# Patient Record
Sex: Female | Born: 1950 | Race: White | Hispanic: No | Marital: Married | Smoking: Former smoker
Health system: Southern US, Community
[De-identification: ages and names within clinical notes are randomized; demographics above are authoritative.]

## PROBLEM LIST (undated history)

## (undated) DIAGNOSIS — I2510 Atherosclerotic heart disease of native coronary artery without angina pectoris: Secondary | ICD-10-CM

## (undated) DIAGNOSIS — Z9229 Personal history of other drug therapy: Secondary | ICD-10-CM

## (undated) DIAGNOSIS — N1832 Chronic kidney disease, stage 3b: Secondary | ICD-10-CM

## (undated) DIAGNOSIS — I480 Paroxysmal atrial fibrillation: Secondary | ICD-10-CM

## (undated) DIAGNOSIS — N179 Acute kidney failure, unspecified: Secondary | ICD-10-CM

## (undated) DIAGNOSIS — I495 Sick sinus syndrome: Secondary | ICD-10-CM

## (undated) DIAGNOSIS — I6523 Occlusion and stenosis of bilateral carotid arteries: Secondary | ICD-10-CM

## (undated) DIAGNOSIS — Z950 Presence of cardiac pacemaker: Secondary | ICD-10-CM

## (undated) DIAGNOSIS — N189 Chronic kidney disease, unspecified: Secondary | ICD-10-CM

## (undated) DIAGNOSIS — E785 Hyperlipidemia, unspecified: Secondary | ICD-10-CM

## (undated) DIAGNOSIS — I701 Atherosclerosis of renal artery: Secondary | ICD-10-CM

## (undated) DIAGNOSIS — I10 Essential (primary) hypertension: Secondary | ICD-10-CM

## (undated) DIAGNOSIS — Z951 Presence of aortocoronary bypass graft: Secondary | ICD-10-CM

## (undated) HISTORY — PX: CORONARY ANGIOPLASTY WITH STENT PLACEMENT: SHX49

## (undated) HISTORY — PX: BOWEL RESECTION: SHX1257

## (undated) HISTORY — DX: Chronic kidney disease, stage 3b: N18.32

## (undated) HISTORY — DX: Acute kidney failure, unspecified: N17.9

## (undated) HISTORY — DX: Chronic kidney disease, unspecified: N18.9

## (undated) HISTORY — PX: CARDIOVERSION: SHX1299

## (undated) HISTORY — PX: CRYOABLATION: SHX1415

## (undated) HISTORY — PX: INSERT / REPLACE / REMOVE PACEMAKER: SUR710

## (undated) NOTE — *Deleted (*Deleted)
Primary Care Physician: Forrest Moron, MD Primary Cardiologist: Dr Dulce Sellar Primary Electrophysiologist: Dr Elberta Fortis Referring Physician: Dr Lenice Pressman is a 57 y.o. female with a history of SSS s/p PPM, CAD s/p CABG, HTN, renal artery stenosis, HLD, atrial flutter and atrial fibrillation who presents for follow up in the [facility]. Patient has a a previous cryoablation in 2019 and has been maintained on sotalol. She had more episodes of afib with symptoms of weakness, fatigue, and SOB and underwent repeat ablation with Dr Elberta Fortis on 03/22/19. She is on Eliquis for a CHADS2VASC score of 4. The device clinic received an alert for an ongoing afib episode since 10/02/19. Patient was unaware of her arrhythmia.   On follow up today, patient is s/p repeat ablation with Dr Elberta Fortis on 04/04/20. ***  Today, she denies symptoms of ***palpitations, chest pain, shortness of breath, orthopnea, PND, lower extremity edema, dizziness, presyncope, syncope, snoring, daytime somnolence, bleeding, or neurologic sequela. The patient is tolerating medications without difficulties and is otherwise without complaint today.    Atrial Fibrillation Risk Factors:  she does not have symptoms or diagnosis of sleep apnea. she does not have a history of rheumatic fever.  she has a BMI of There is no height or weight on file to calculate BMI.. There were no vitals filed for this visit.  Family History  Problem Relation Age of Onset  . Hypertension Mother   . Heart attack Father 78  . Hypertension Sister   . Hypertension Sister   . Hypertension Daughter   . Hypertension Son      Atrial Fibrillation Management history:  Previous antiarrhythmic drugs: sotalol  Previous cardioversions: remotely Previous ablations: cryoablation 2019, 03/22/19 (fib and flutter), 04/04/20 CHADS2VASC score: 4 Anticoagulation history: Eliquis   Past Medical History:  Diagnosis Date  . CAD  in native artery 01/02/2019  . Carotid stenosis, bilateral 01/02/2019  . Essential hypertension 01/02/2019  . History of amiodarone therapy 01/02/2019  . Hx of CABG 01/02/2019  . Hyperlipidemia 01/02/2019  . PAF (paroxysmal atrial fibrillation) (HCC) 01/02/2019  . Renal artery stenosis (HCC) 01/02/2019  . Sick sinus syndrome (HCC) 01/02/2019   Past Surgical History:  Procedure Laterality Date  . ATRIAL FIBRILLATION ABLATION N/A 03/22/2019   Procedure: ATRIAL FIBRILLATION ABLATION;  Surgeon: Regan Lemming, MD;  Location: [facility] INV[facility] CV LAB;  Service: Cardiovascular;  Laterality: N/A;  . ATRIAL FIBRILLATION ABLATION N/A 04/04/2020   Procedure: ATRIAL FIBRILLATION ABLATION;  Surgeon: Regan Lemming, MD;  Location: [facility] INVASIVE CV LAB;  Service: Cardiovascular;  Laterality: N/A;  . BOWEL RESECTION    . CARDIOVERSION    . CORONARY ANGIOPLASTY WITH STENT PLACEMENT    . CORONARY ARTERY BYPASS GRAFT  2016   x 3 vessels  . CRYOABLATION    . INSERT / REPLACE / REMOVE PACEMAKER      Current Outpatient Medications  Medication Sig Dispense Refill  . acetaminophen (TYLENOL) 500 MG tablet Take 1,000 mg by mouth every 6 (six) hours as needed for moderate pain or headache.    . albuterol (VENTOLIN HFA) 108 (90 Base) MCG/ACT inhaler Inhale 2 puffs into the lungs every 6 (six) hours as needed for wheezing or shortness of breath.     . allopurinol (ZYLOPRIM) 100 MG tablet Take 100 mg by mouth daily.    Marland Kitchen ALPRAZolam (XANAX) 0.25 MG tablet Take 0.125-0.25 mg by mouth at bedtime.     Marland Kitchen aspirin EC 81 MG tablet Take 81  mg by mouth daily.     Marland Kitchen atorvastatin (LIPITOR) 40 MG tablet Take 40 mg by mouth daily.     . Biotin 5000 MCG CAPS Take 5,000 mcg by mouth daily.    . [place]-Glucosamine-Vit D (OSTEO BI-FLEX ONE PER DAY PO) Take 1 tablet by mouth daily.    . carvedilol (COREG) 12.5 MG tablet TAKE 1 TABLET BY MOUTH 2 TIMES DAILY. 180 tablet 1  . cloNIDine (CATAPRES) 0.1 MG tablet Take 0.1 mg by mouth 2  (two) times daily.    Marland Kitchen ELIQUIS 5 MG TABS tablet TAKE 1 TABLET BY MOUTH TWICE A DAY (Patient taking differently: Take 5 mg by mouth 2 (two) times daily. ) 180 tablet 2  . furosemide (LASIX) 40 MG tablet Take 20-40 mg by mouth See admin instructions. Take 40 mg in the morning and 20 mg in the evening    . levothyroxine (SYNTHROID) 75 MCG tablet Take 75 mcg by mouth daily before breakfast.     . losartan (COZAAR) 100 MG tablet Take 100 mg by mouth daily.    . nitroGLYCERIN (NITROSTAT) 0.4 MG SL tablet Place 0.4 mg under the tongue every 5 (five) minutes x 3 doses as needed for chest pain.     . potassium chloride (KLOR-CON) 10 MEQ tablet Take 10 mEq by mouth every other day.    . pramipexole (MIRAPEX) 0.25 MG tablet Take 0.25 mg by mouth every evening.     . saline (AYR) GEL Place 1 application into the nose daily as needed (dryness).    . sotalol (BETAPACE) 80 MG tablet Take 1 tablet (80 mg total) by mouth 2 (two) times daily. (Patient not taking: Reported on 03/28/2020) 180 tablet 3  . SOTALOL AF 80 MG TABS Take 80 mg by mouth 2 (two) times daily.     . vitamin B-12 (CYANOCOBALAMIN) 1000 MCG tablet Take 1,000 mcg by mouth daily.     No current facility-administered medications for this visit.    Allergies  Allergen Reactions  . Tizanidine Itching and Other (See Comments)    "didnt like the way it made her feel" -- weakness   . Ace Inhibitors Other (See Comments)    Cannot recall   . Hydrocodone-Homatropine Nausea And Vomiting  . Rofecoxib Nausea Only    MADE ME FEEL WEIRD     Social History   Socioeconomic History  . Marital status: Married    Spouse name: Not on file  . Number of children: Not on file  . Years of education: Not on file  . Highest education level: Not on file  Occupational History  . Not on file  Tobacco Use  . Smoking status: Former Smoker    Packs/day: 0.75    Years: 45.00    Pack years: 33.75    Types: Cigarettes    Quit date: 2009    Years since  quitting: 12.8  . Smokeless tobacco: Never Used  Vaping Use  . Vaping Use: Never used  Substance and Sexual Activity  . Alcohol use: Yes    Alcohol/week: 2.0 standard drinks    Types: 2 Cans of beer per week    Comment: 2 beers daily  . Drug use: Never  . Sexual activity: Not on file  Other Topics Concern  . Not on file  Social History Narrative  . Not on file   Social Determinants of Health   Financial Resource Strain:   . Difficulty of Paying Living Expenses: Not on file  Food  Insecurity:   . Worried About Programme researcher, broadcasting/film/video in the Last Year: Not on file  . Ran Out of Food in the Last Year: Not on file  Transportation Needs:   . Lack of Transportation (Medical): Not on file  . Lack of Transportation (Non-Medical): Not on file  Physical Activity:   . Days of Exercise per Week: Not on file  . Minutes of Exercise per Session: Not on file  Stress:   . Feeling of Stress : Not on file  Social Connections:   . Frequency of Communication with Friends and Family: Not on file  . Frequency of Social Gatherings with Friends and Family: Not on file  . Attends Religious Services: Not on file  . Active Member of Clubs or Organizations: Not on file  . Attends Banker Meetings: Not on file  . Marital Status: Not on file  Intimate Partner Violence:   . Fear of Current or Ex-Partner: Not on file  . Emotionally Abused: Not on file  . Physically Abused: Not on file  . Sexually Abused: Not on file     ROS- All systems are reviewed and negative except as per the HPI above.  Physical Exam: There were no vitals filed for this visit.  GEN- The patient is well appearing, alert and oriented x 3 today.   HEENT-head normocephalic, atraumatic, sclera clear, conjunctiva pink, hearing intact, trachea midline. Lungs- Clear to ausculation bilaterally, normal work of breathing Heart- ***Regular rate and rhythm, no murmurs, rubs or gallops  GI- soft, NT, ND, + BS Extremities- no  clubbing, cyanosis, or edema MS- no significant deformity or atrophy Skin- no rash or lesion Psych- euthymic mood, full affect Neuro- strength and sensation are intact   Wt Readings from Last 3 Encounters:  04/04/20 86.6 kg  03/30/20 88 kg  02/28/20 86.6 kg    EKG today demonstrates ***  Echo 01/10/19 demonstrated   1. Mild hypokinesis of the left ventricular, basal-mid inferoseptal wall.  2. The left ventricle has low normal systolic function, with an ejection fraction of 50-55%. The cavity size was normal. Left ventricular diastolic Doppler parameters are consistent with pseudonormalization.  3. The right ventricle has normal systolic function. The cavity was normal. There is no increase in right ventricular wall thickness.  4. Left atrial size was mildly dilated.  5. Mild thickening of the aortic valve. Mild calcification of the aortic valve. Aortic valve regurgitation was not assessed by color flow Doppler.  6. The aorta is normal in size and structure.   Epic records are reviewed at length today  Assessment and Plan:  1. Paroxysmal atrial fibrillation/atrial flutter S/p cryoablation 2019. S/p RF ablation 03/22/19 and 04/04/20 with Dr Elberta Fortis. *** Continue Eliquis 5 mg BID  Continue sotalol 80 mg BID. QT stable. Continue Coreg 12.5 mg BID  This patients CHA2DS2-VASc Score and unadjusted Ischemic Stroke Rate (% per year) is equal to 4.8 % stroke rate/year from a score of 4  Above score calculated as 1 point each if present [CHF, HTN, DM, Vascular=MI/PAD/Aortic Plaque, Age if 65-74, or Female] Above score calculated as 2 points each if present [Age > 75, or Stroke/TIA/TE]  2. Obesity There is no height or weight on file to calculate BMI. Lifestyle modification was discussed and encouraged including regular physical activity and weight reduction. ***  3. CAD S/p CABG. No anginal symptoms. ***  4. HTN Stable, no changes today. ***  5. Sick sinus syndrome S/p PPM,  followed by Dr  Camnitz and the device clinic. ***   Follow up ***     Jorja Loa PA-C [facility] [address] [place], [place] [address] [phone] 04/25/2020 10:56 AM

---
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
## 2014-04-20 DIAGNOSIS — M79645 Pain in left finger(s): Secondary | ICD-10-CM

## 2014-04-20 HISTORY — DX: Pain in left finger(s): M79.645

## 2014-06-16 HISTORY — PX: CORONARY ARTERY BYPASS GRAFT: SHX141

## 2015-02-18 DIAGNOSIS — F1010 Alcohol abuse, uncomplicated: Secondary | ICD-10-CM

## 2015-02-18 DIAGNOSIS — E039 Hypothyroidism, unspecified: Secondary | ICD-10-CM | POA: Insufficient documentation

## 2015-02-18 HISTORY — DX: Alcohol abuse, uncomplicated: F10.10

## 2015-03-21 DIAGNOSIS — Z951 Presence of aortocoronary bypass graft: Secondary | ICD-10-CM | POA: Insufficient documentation

## 2015-03-30 DIAGNOSIS — I480 Paroxysmal atrial fibrillation: Secondary | ICD-10-CM | POA: Diagnosis present

## 2015-08-16 DIAGNOSIS — K219 Gastro-esophageal reflux disease without esophagitis: Secondary | ICD-10-CM | POA: Insufficient documentation

## 2015-08-16 DIAGNOSIS — I252 Old myocardial infarction: Secondary | ICD-10-CM | POA: Insufficient documentation

## 2015-08-16 HISTORY — DX: Old myocardial infarction: I25.2

## 2015-08-17 DIAGNOSIS — Z86010 Personal history of colonic polyps: Secondary | ICD-10-CM

## 2015-08-17 HISTORY — DX: Personal history of colonic polyps: Z86.010

## 2016-09-17 DIAGNOSIS — H43391 Other vitreous opacities, right eye: Secondary | ICD-10-CM | POA: Insufficient documentation

## 2016-09-17 DIAGNOSIS — H43811 Vitreous degeneration, right eye: Secondary | ICD-10-CM

## 2016-09-17 HISTORY — DX: Vitreous degeneration, right eye: H43.811

## 2016-09-17 HISTORY — DX: Other vitreous opacities, right eye: H43.391

## 2016-09-23 DIAGNOSIS — I824Z2 Acute embolism and thrombosis of unspecified deep veins of left distal lower extremity: Secondary | ICD-10-CM | POA: Insufficient documentation

## 2016-09-23 DIAGNOSIS — N289 Disorder of kidney and ureter, unspecified: Secondary | ICD-10-CM | POA: Insufficient documentation

## 2016-09-23 DIAGNOSIS — M79605 Pain in left leg: Secondary | ICD-10-CM

## 2016-09-23 HISTORY — DX: Pain in left leg: M79.605

## 2016-11-22 DIAGNOSIS — K559 Vascular disorder of intestine, unspecified: Secondary | ICD-10-CM | POA: Insufficient documentation

## 2016-11-22 DIAGNOSIS — N179 Acute kidney failure, unspecified: Secondary | ICD-10-CM | POA: Insufficient documentation

## 2016-11-22 DIAGNOSIS — I82402 Acute embolism and thrombosis of unspecified deep veins of left lower extremity: Secondary | ICD-10-CM | POA: Insufficient documentation

## 2016-11-22 DIAGNOSIS — R739 Hyperglycemia, unspecified: Secondary | ICD-10-CM

## 2016-11-22 HISTORY — DX: Hypercalcemia: E83.52

## 2016-11-22 HISTORY — DX: Hyperglycemia, unspecified: R73.9

## 2017-08-20 DIAGNOSIS — Z8679 Personal history of other diseases of the circulatory system: Secondary | ICD-10-CM | POA: Insufficient documentation

## 2017-09-22 DIAGNOSIS — R001 Bradycardia, unspecified: Secondary | ICD-10-CM | POA: Insufficient documentation

## 2018-03-04 DIAGNOSIS — Z45018 Encounter for adjustment and management of other part of cardiac pacemaker: Secondary | ICD-10-CM

## 2018-03-04 HISTORY — DX: Encounter for adjustment and management of other part of cardiac pacemaker: Z45.018

## 2018-07-14 DIAGNOSIS — I4719 Other supraventricular tachycardia: Secondary | ICD-10-CM | POA: Insufficient documentation

## 2018-07-14 DIAGNOSIS — I471 Supraventricular tachycardia: Secondary | ICD-10-CM | POA: Insufficient documentation

## 2018-08-05 DIAGNOSIS — I6529 Occlusion and stenosis of unspecified carotid artery: Secondary | ICD-10-CM | POA: Insufficient documentation

## 2019-01-02 DIAGNOSIS — I2510 Atherosclerotic heart disease of native coronary artery without angina pectoris: Secondary | ICD-10-CM

## 2019-01-02 DIAGNOSIS — Z9229 Personal history of other drug therapy: Secondary | ICD-10-CM | POA: Insufficient documentation

## 2019-01-02 DIAGNOSIS — I10 Essential (primary) hypertension: Secondary | ICD-10-CM

## 2019-01-02 DIAGNOSIS — I6523 Occlusion and stenosis of bilateral carotid arteries: Secondary | ICD-10-CM | POA: Insufficient documentation

## 2019-01-02 DIAGNOSIS — I495 Sick sinus syndrome: Secondary | ICD-10-CM

## 2019-01-02 DIAGNOSIS — Z951 Presence of aortocoronary bypass graft: Secondary | ICD-10-CM

## 2019-01-02 DIAGNOSIS — I480 Paroxysmal atrial fibrillation: Secondary | ICD-10-CM

## 2019-01-02 DIAGNOSIS — E785 Hyperlipidemia, unspecified: Secondary | ICD-10-CM

## 2019-01-02 DIAGNOSIS — I701 Atherosclerosis of renal artery: Secondary | ICD-10-CM

## 2019-01-02 HISTORY — DX: Atherosclerosis of renal artery: I70.1

## 2019-01-02 HISTORY — DX: Atherosclerotic heart disease of native coronary artery without angina pectoris: I25.10

## 2019-01-02 HISTORY — DX: Presence of aortocoronary bypass graft: Z95.1

## 2019-01-02 HISTORY — DX: Hyperlipidemia, unspecified: E78.5

## 2019-01-02 HISTORY — DX: Occlusion and stenosis of bilateral carotid arteries: I65.23

## 2019-01-02 HISTORY — DX: Essential (primary) hypertension: I10

## 2019-01-02 HISTORY — DX: Personal history of other drug therapy: Z92.29

## 2019-01-02 HISTORY — DX: Paroxysmal atrial fibrillation: I48.0

## 2019-01-02 HISTORY — DX: Sick sinus syndrome: I49.5

## 2019-01-02 NOTE — Progress Notes (Signed)
Cardiology Office Note:    Date:  01/03/2019   ID:  Diane Valentine, DOB 06-27-1950, MRN [id number]  PCP:  Forrest Moronuehle, Stephen, MD  Cardiologist:  Norman HerrlichBrian , MD   Referring MD: Forrest Moronuehle, Stephen, MD  ASSESSMENT:    1. PAF (paroxysmal atrial fibrillation) (HCC)   2. Chronic anticoagulation   3. Sick sinus syndrome (HCC)   4. Pacemaker   5. CAD in native artery   6. Hx of CABG   7. Essential hypertension   8. Renal artery stenosis (HCC)   9. Mixed hyperlipidemia   10. Carotid stenosis, bilateral   11. History of amiodarone therapy    PLAN:    In order of problems listed above:  1. Continued symptoms after EP cryoablation antiarrhythmic drug therapy refer to device clinic interrogation and EP consultation for the moment continue sotalol 2. Continue her current anticoagulant moderate risk of stroke 3. Refer to device clinic for management sick sinus syndrome pacemaker 4. Stable CAD after CABG continue current treatment but likely needs intensification of lipid-lowering therapy 5. Hypertension not at target transition from atenolol to carvedilol and obtain results of recently performed renal artery duplex 6. Recheck lipid profile liver function and may require addition of PCSK9 7. Obtain recent carotid duplex she has had bilateral carotid endarterectomy and if she has significant abnormality will need referral to vascular surgery  Next appointment 4 weeks   Medication Adjustments/Labs and Tests Ordered: Current medicines are reviewed at length with the patient today.  Concerns regarding medicines are outlined above.  Orders Placed This Encounter  Procedures  . TSH  . CBC  . Comprehensive Metabolic Panel (CMET)  . Lipid Profile  . Pro b natriuretic peptide  . Ambulatory referral to Cardiac Electrophysiology  . EKG 12-Lead  . ECHOCARDIOGRAM COMPLETE   Meds ordered this encounter  Medications  . carvedilol (COREG) 12.5 MG tablet    Sig: Take 1 tablet (12.5 mg total) by mouth  2 (two) times daily.    Dispense:  60 tablet    Refill:  3     Chief Complaint  Patient presents with  . Atrial Fibrillation    History of Present Illness:    Diane Valentine is a 68 y.o. female who is being seen today for the evaluation of atrial fibrillation at the request of Forrest Moronuehle, Stephen, MD.  [facility] electrophysiology for sick sinus syndrome with a permanent pacemaker and paroxysmal atrial fibrillation with both previous cardioversion and EP cryoablation in 2019.  Other problems include hypertension renal artery stenosis with previous right renal artery stent dyslipidemia and coronary artery disease.  She has had a previous bilateral carotid endarterectomy approximately 10 years ago had been followed by vascular surgery at the same institution.  She also has a history of deep vein thrombosis in September 2017 left lower extremity.  She had both bilateral renal artery duplex and carotid duplexes performed 11/03/2018 results are not available in care everywhere.  There is documentation of the time of EP consultation 07/24/2017 that amiodarone was ineffective in maintaining sinus rhythm  She is very frustrated especially with atrial fibrillation.  She has had cardioversion antiarrhythmic drug with amiodarone that failed cryoablation most recently sotalol.  She continues to have episodes at night where she thinks she is in atrial fibrillation palpitation irregular heart rhythm quite bothersome and despite calls has not received an answer from the pacemaker clinic.  That frustration letter to be here today.  She is anticoagulated and is interested in either optimizing antiarrhythmic drug therapy or repeat EP ablation.  She has a history of CAD and bypass surgery she has no angina I cannot find a recent ejection fraction echocardiogram ordered.  Have ordered complete labs including thyroid along with liver CBC proBNP and a CMP.  She told  me at one point she gained 15 pounds had marked edema short of breath placed on furosemide but does not carry attack of congestive heart failure.  After discussing her wishes with her I will refer to EP in our practice Dr. Elberta Fortisamnitz in device clinic and see if we get an answer about her atrial fibrillation burden and options for treatment either is transitioning to dofetilide or considering further EP catheter ablation if needed.  She is comfortable with this approach.  Her blood pressure is consistently greater than 150 systolic and will transition from atenolol minimal effect of potent antihypertensive carvedilol which should help with arrhythmia and achieve blood pressure control.  She likely needs intensification lipid-lowering therapy with poly-vascular disease and if her LDL remains greater than 70 PCSK9 be appropriate addition to Zetia and statin Past Medical History:  Diagnosis Date  . CAD in native artery 01/02/2019  . Carotid stenosis, bilateral 01/02/2019  . Essential hypertension 01/02/2019  . History of amiodarone therapy 01/02/2019  . Hx of CABG 01/02/2019  . Hyperlipidemia 01/02/2019  . PAF (paroxysmal atrial fibrillation) (HCC) 01/02/2019  . Renal artery stenosis (HCC) 01/02/2019  . Sick sinus syndrome (HCC) 01/02/2019    Past Surgical History:  Procedure Laterality Date  . BOWEL RESECTION    . CARDIOVERSION    . CORONARY ANGIOPLASTY WITH STENT PLACEMENT    . CORONARY ARTERY BYPASS GRAFT  2016   x 3 vessels  . CRYOABLATION    . INSERT / REPLACE / REMOVE PACEMAKER      Current Medications: Current Meds  Medication Sig  . albuterol (VENTOLIN HFA) 108 (90 Base) MCG/ACT inhaler Inhale 2 puffs into the lungs every 6 (six) hours as needed.  Marland Kitchen. allopurinol (ZYLOPRIM) 100 MG tablet Take 100 mg by mouth daily.  Marland Kitchen. apixaban (ELIQUIS) 5 MG TABS tablet TAKE 1 TABLET TWICE DAILY  . aspirin EC 81 MG tablet Take 1 tablet by mouth daily.  Marland Kitchen. atorvastatin (LIPITOR) 40 MG tablet TAKE 1 TABLET EVERY  DAY  . cloNIDine (CATAPRES) 0.1 MG tablet Take 0.1 mg by mouth 2 (two) times daily.  . furosemide (LASIX) 40 MG tablet Take 1 tablet in the morning and 0.5 tablet in the evening  . levothyroxine (SYNTHROID) 75 MCG tablet Take 75 mcg by mouth daily.  Marland Kitchen. losartan (COZAAR) 100 MG tablet Take 100 mg by mouth daily.  . nitroGLYCERIN (NITROSTAT) 0.4 MG SL tablet Place 0.4 mg under the tongue every 5 (five) minutes x 3 doses as needed.  . potassium chloride (MICRO-K) 10 MEQ CR capsule Take 10 mEq by mouth daily.  . pramipexole (MIRAPEX) 0.25 MG tablet TAKE 1 TABLET BY MOUTH 2 TO 3 HOURS PRIOR TO BEDTIME  . sotalol (BETAPACE) 120 MG tablet Take 1 tablet by mouth 2 (two) times a day.  . vitamin B-12 (CYANOCOBALAMIN) 1000 MCG tablet Take 1,000 mcg by mouth daily.  . [DISCONTINUED] atenolol (TENORMIN) 100 MG tablet Take 100 mg by mouth daily.     Allergies:   Tizanidine, Ace inhibitors, Hydrocodone-homatropine, and Rofecoxib   Social History   Socioeconomic History  . Marital status: Married    Spouse name: Not on file  .  Number of children: Not on file  . Years of education: Not on file  . Highest education level: Not on file  Occupational History  . Not on file  Social Needs  . Financial resource strain: Not on file  . Food insecurity    Worry: Not on file    Inability: Not on file  . Transportation needs    Medical: Not on file    Non-medical: Not on file  Tobacco Use  . Smoking status: Former Smoker    Packs/day: 0.75    Years: 45.00    Pack years: 33.75    Types: Cigarettes    Quit date: 2009    Years since quitting: 11.5  . Smokeless tobacco: Never Used  Substance and Sexual Activity  . Alcohol use: Yes    Alcohol/week: 1.0 - 2.0 standard drinks    Types: 1 - 2 Cans of beer per week    Comment: 1-2 beers daily  . Drug use: Never  . Sexual activity: Not on file  Lifestyle  . Physical activity    Days per week: Not on file    Minutes per session: Not on file  . Stress:  Not on file  Relationships  . Social Herbalist on phone: Not on file    Gets together: Not on file    Attends religious service: Not on file    Active member of club or organization: Not on file    Attends meetings of clubs or organizations: Not on file    Relationship status: Not on file  Other Topics Concern  . Not on file  Social History Narrative  . Not on file     Family History: The patient's family history includes Heart attack (age of onset: 28) in her father; Hypertension in her daughter, mother, sister, sister, and son.  ROS:   Review of Systems  Constitution: Positive for malaise/fatigue.  HENT: Negative.   Eyes: Negative.   Cardiovascular: Positive for dyspnea on exertion, leg swelling and palpitations.  Respiratory: Positive for shortness of breath.   Endocrine: Negative.   Hematologic/Lymphatic: Negative.   Skin: Negative.   Musculoskeletal: Negative.   Gastrointestinal: Negative.   Genitourinary: Negative.   Neurological: Negative.   Psychiatric/Behavioral: Negative.   Allergic/Immunologic: Negative.    Please see the history of present illness.     All other systems reviewed and are negative.  EKGs/Labs/Other Studies Reviewed:    The following studies were reviewed today:   EKG:  EKG is  ordered today.  The ekg ordered today is personally reviewed and demonstrates sinus rhythm right bundle branch block deep symmetrical precordial T wave inversion normal QT interval occasional ventricular paced complexes Echo 08/18/2017:    Recent Labs: 12/02/2018: CBC normal BMP normal potassium 3.9 GFR greater than 60 cc 07/23/2016 cholesterol 188 HDL 74 LDL calculated at 94  Physical Exam:    VS:  BP (!) 152/98 (BP Location: Right Arm, Patient Position: Sitting, Cuff Size: Large)   Pulse 83   Ht 5\' 5"  (1.651 m)   Wt 189 lb (85.7 kg)   SpO2 94%   BMI 31.45 kg/m     Wt Readings from Last 3 Encounters:  01/03/19 189 lb (85.7 kg)     GEN:  Well  nourished, well developed in no acute distress HEENT: Normal NECK: No JVD; No carotid bruits LYMPHATICS: No lymphadenopathy CARDIAC: RRR, no murmurs, rubs, gallops RESPIRATORY:  Clear to auscultation without rales, wheezing or rhonchi  ABDOMEN:  Soft, non-tender, non-distended MUSCULOSKELETAL:  No edema; No deformity  SKIN: Warm and dry NEUROLOGIC:  Alert and oriented x 3 PSYCHIATRIC:  Normal affect     Signed, Norman HerrlichBrian , MD  01/03/2019 5:04 PM    [facility]

## 2019-01-03 ENCOUNTER — Ambulatory Visit (INDEPENDENT_AMBULATORY_CARE_PROVIDER_SITE_OTHER): Payer: Medicare Other | Admitting: Cardiology

## 2019-01-03 ENCOUNTER — Other Ambulatory Visit: Payer: Self-pay

## 2019-01-03 VITALS — BP 152/98 | HR 83 | Ht 65.0 in | Wt 189.0 lb

## 2019-01-03 DIAGNOSIS — I701 Atherosclerosis of renal artery: Secondary | ICD-10-CM

## 2019-01-03 DIAGNOSIS — Z7901 Long term (current) use of anticoagulants: Secondary | ICD-10-CM | POA: Diagnosis not present

## 2019-01-03 DIAGNOSIS — Z950 Presence of cardiac pacemaker: Secondary | ICD-10-CM

## 2019-01-03 DIAGNOSIS — I2510 Atherosclerotic heart disease of native coronary artery without angina pectoris: Secondary | ICD-10-CM

## 2019-01-03 DIAGNOSIS — I480 Paroxysmal atrial fibrillation: Secondary | ICD-10-CM

## 2019-01-03 DIAGNOSIS — I10 Essential (primary) hypertension: Secondary | ICD-10-CM

## 2019-01-03 DIAGNOSIS — I495 Sick sinus syndrome: Secondary | ICD-10-CM | POA: Diagnosis not present

## 2019-01-03 DIAGNOSIS — E782 Mixed hyperlipidemia: Secondary | ICD-10-CM

## 2019-01-03 DIAGNOSIS — Z9229 Personal history of other drug therapy: Secondary | ICD-10-CM

## 2019-01-03 DIAGNOSIS — I6523 Occlusion and stenosis of bilateral carotid arteries: Secondary | ICD-10-CM

## 2019-01-03 DIAGNOSIS — Z951 Presence of aortocoronary bypass graft: Secondary | ICD-10-CM

## 2019-01-03 MED ORDER — CARVEDILOL 12.5 MG PO TABS: 12.5000 mg | ORAL_TABLET | Freq: Two times a day (BID) | ORAL | 3 refills | Status: DC

## 2019-01-03 NOTE — Patient Instructions (Signed)
Medication Instructions:  STOP: Atenolol START: Carvedilol 12.5 mg twice daily  If you need a refill on your cardiac medications before your next appointment, please call your pharmacy.   Lab work: TSH CBC CMP Lipids Pro BNP  If you have labs (blood work) drawn today and your tests are completely normal, you will receive your results only by: Marland Kitchen. MyChart Message (if you have MyChart) OR . A paper copy in the mail If you have any lab test that is abnormal or we need to change your treatment, we will call you to review the results.  Testing/Procedures: Your physician has requested that you have an echocardiogram. Echocardiography is a painless test that uses sound waves to create images of your heart. It provides your doctor with information about the size and shape of your heart and how well your heart's chambers and valves are working. This procedure takes approximately one hour. There are no restrictions for this procedure.  Follow-Up: At [facility], you and your health needs are our priority.  As part of our continuing mission to provide you with exceptional heart care, we have created designated Provider Care Teams.  These Care Teams include your primary Cardiologist (physician) and Advanced Practice Providers (APPs -  Physician Assistants and Nurse Practitioners) who all work together to provide you with the care you need, when you need it. .   Any Other Special Instructions Will Be Listed Below (If Applicable). You're being referred to Dr. Elberta Fortisamnitz for your pacemaker. They will contact you to schedule your appt.   Echocardiogram An echocardiogram is a procedure that uses painless sound waves (ultrasound) to produce an image of the heart. Images from an echocardiogram can provide important information about:  Signs of coronary artery disease (CAD).  Aneurysm detection. An aneurysm is a weak or damaged part of an artery wall that bulges out from the normal force of blood pumping  through the body.  Heart size and shape. Changes in the size or shape of the heart can be associated with certain conditions, including heart failure, aneurysm, and CAD.  Heart muscle function.  Heart valve function.  Signs of a past heart attack.  Fluid buildup around the heart.  Thickening of the heart muscle.  A tumor or infectious growth around the heart valves. Tell a health care provider about:  Any allergies you have.  All medicines you are taking, including vitamins, herbs, eye drops, creams, and over-the-counter medicines.  Any blood disorders you have.  Any surgeries you have had.  Any medical conditions you have.  Whether you are pregnant or may be pregnant. What are the risks? Generally, this is a safe procedure. However, problems may occur, including:  Allergic reaction to dye (contrast) that may be used during the procedure. What happens before the procedure? No specific preparation is needed. You may eat and drink normally. What happens during the procedure?   An IV tube may be inserted into one of your veins.  You may receive contrast through this tube. A contrast is an injection that improves the quality of the pictures from your heart.  A gel will be applied to your chest.  A wand-like tool (transducer) will be moved over your chest. The gel will help to transmit the sound waves from the transducer.  The sound waves will harmlessly bounce off of your heart to allow the heart images to be captured in real-time motion. The images will be recorded on a computer. The procedure may vary among health care  providers and hospitals. What happens after the procedure?  You may return to your normal, everyday life, including diet, activities, and medicines, unless your health care provider tells you not to do that. Summary  An echocardiogram is a procedure that uses painless sound waves (ultrasound) to produce an image of the heart.  Images from an  echocardiogram can provide important information about the size and shape of your heart, heart muscle function, heart valve function, and fluid buildup around your heart.  You do not need to do anything to prepare before this procedure. You may eat and drink normally.  After the echocardiogram is completed, you may return to your normal, everyday life, unless your health care provider tells you not to do that. This information is not intended to replace advice given to you by your health care provider. Make sure you discuss any questions you have with your health care provider. Document Released: 05/30/2000 Document Revised: 09/23/2018 Document Reviewed: 07/05/2016 Elsevier Patient Education  2020 [employer].

## 2019-01-04 LAB — COMPREHENSIVE METABOLIC PANEL
ALT: 19 IU/L (ref 0–32)
AST: 23 IU/L (ref 0–40)
Albumin/Globulin Ratio: 1.9 (ref 1.2–2.2)
Albumin: 4.8 g/dL (ref 3.8–4.8)
Alkaline Phosphatase: 123 IU/L — ABNORMAL HIGH (ref 39–117)
BUN/Creatinine Ratio: 17 (ref 12–28)
BUN: 29 mg/dL — ABNORMAL HIGH (ref 8–27)
Bilirubin Total: 0.6 mg/dL (ref 0.0–1.2)
CO2: 25 mmol/L (ref 20–29)
Calcium: 10.5 mg/dL — ABNORMAL HIGH (ref 8.7–10.3)
Chloride: 94 mmol/L — ABNORMAL LOW (ref 96–106)
Creatinine, Ser: 1.67 mg/dL — ABNORMAL HIGH (ref 0.57–1.00)
GFR calc Af Amer: 36 mL/min/{1.73_m2} — ABNORMAL LOW (ref >59)
GFR calc non Af Amer: 31 mL/min/{1.73_m2} — ABNORMAL LOW (ref >59)
Globulin, Total: 2.5 g/dL (ref 1.5–4.5)
Glucose: 106 mg/dL — ABNORMAL HIGH (ref 65–99)
Potassium: 4.6 mmol/L (ref 3.5–5.2)
Sodium: 137 mmol/L (ref 134–144)
Total Protein: 7.3 g/dL (ref 6.0–8.5)

## 2019-01-04 LAB — CBC
Hematocrit: 48 % — ABNORMAL HIGH (ref 34.0–46.6)
Hemoglobin: 15.6 g/dL (ref 11.1–15.9)
MCH: 30.5 pg (ref 26.6–33.0)
MCHC: 32.5 g/dL (ref 31.5–35.7)
MCV: 94 fL (ref 79–97)
Platelets: 182 10*3/uL (ref 150–450)
RBC: 5.12 x10E6/uL (ref 3.77–5.28)
RDW: 15.5 % — ABNORMAL HIGH (ref 11.7–15.4)
WBC: 6.1 10*3/uL (ref 3.4–10.8)

## 2019-01-04 LAB — LIPID PANEL
Chol/HDL Ratio: 2.7 ratio (ref 0.0–4.4)
Cholesterol, Total: 167 mg/dL (ref 100–199)
HDL: 61 mg/dL (ref >39)
LDL Calculated: 79 mg/dL (ref 0–99)
Triglycerides: 133 mg/dL (ref 0–149)
VLDL Cholesterol Cal: 27 mg/dL (ref 5–40)

## 2019-01-04 LAB — TSH: TSH: 1.59 u[IU]/mL (ref 0.450–4.500)

## 2019-01-04 LAB — PRO B NATRIURETIC PEPTIDE: NT-Pro BNP: 3178 pg/mL — ABNORMAL HIGH (ref 0–301)

## 2019-01-06 ENCOUNTER — Other Ambulatory Visit (HOSPITAL_BASED_OUTPATIENT_CLINIC_OR_DEPARTMENT_OTHER): Payer: Medicare Other

## 2019-01-10 ENCOUNTER — Other Ambulatory Visit: Payer: Self-pay

## 2019-01-10 ENCOUNTER — Ambulatory Visit (HOSPITAL_BASED_OUTPATIENT_CLINIC_OR_DEPARTMENT_OTHER)
Admission: RE | Admit: 2019-01-10 | Discharge: 2019-01-10 | Disposition: A | Discharge status: Home / Self Care | Payer: Medicare Other | Source: Ambulatory Visit | Attending: Cardiology | Admitting: Cardiology

## 2019-01-10 DIAGNOSIS — I2510 Atherosclerotic heart disease of native coronary artery without angina pectoris: Secondary | ICD-10-CM

## 2019-01-10 DIAGNOSIS — Z951 Presence of aortocoronary bypass graft: Secondary | ICD-10-CM | POA: Insufficient documentation

## 2019-01-10 DIAGNOSIS — I10 Essential (primary) hypertension: Secondary | ICD-10-CM | POA: Insufficient documentation

## 2019-01-10 NOTE — Progress Notes (Signed)
  Echocardiogram 2D Echocardiogram has been performed.  Diane Valentine 01/10/2019, 2:53 PM

## 2019-02-02 DIAGNOSIS — Z961 Presence of intraocular lens: Secondary | ICD-10-CM

## 2019-02-02 HISTORY — DX: Presence of intraocular lens: Z96.1

## 2019-02-08 ENCOUNTER — Encounter: Payer: Self-pay | Admitting: Cardiology

## 2019-02-08 ENCOUNTER — Ambulatory Visit (INDEPENDENT_AMBULATORY_CARE_PROVIDER_SITE_OTHER): Payer: Medicare Other | Admitting: Cardiology

## 2019-02-08 ENCOUNTER — Other Ambulatory Visit: Payer: Self-pay

## 2019-02-08 VITALS — BP 164/102 | HR 78 | Ht 65.0 in | Wt 190.6 lb

## 2019-02-08 DIAGNOSIS — I4819 Other persistent atrial fibrillation: Secondary | ICD-10-CM

## 2019-02-08 DIAGNOSIS — Z01812 Encounter for preprocedural laboratory examination: Secondary | ICD-10-CM

## 2019-02-08 DIAGNOSIS — I495 Sick sinus syndrome: Secondary | ICD-10-CM

## 2019-02-08 NOTE — Progress Notes (Signed)
Electrophysiology Office Note   Date:  02/08/2019   ID:  Diane Valentine, DOB 1951/02/28, MRN [id number]  PCP:  Charleston Poot, MD  Cardiologist:  Bettina Gavia Primary Electrophysiologist:  Selden Noteboom Meredith Leeds, MD    No chief complaint on file.    History of Present Illness: Diane Valentine is a 68 y.o. female who is being seen today for the evaluation of pacemaker at the request of Bettina Gavia, Hilton Cork, MD. Presenting today for electrophysiology evaluation.  She has a history significant for paroxysmal atrial fibrillation, sick sinus syndrome status post Jude pacemaker, coronary artery disease status post CABG, hypertension, renal artery stenosis, hyperlipidemia.  She has had a cryoablation in 2019 for atrial fibrillation and is currently on sotalol.  Her main symptoms are weakness, fatigue, and mild shortness of breath.  She also has palpitations at times.  She feels that she has been out of rhythm for quite some time.  Today, she denies symptoms of chest pain, orthopnea, PND, lower extremity edema, claudication, dizziness, presyncope, syncope, bleeding, or neurologic sequela. The patient is tolerating medications without difficulties.    Past Medical History:  Diagnosis Date  . CAD in native artery 01/02/2019  . Carotid stenosis, bilateral 01/02/2019  . Essential hypertension 01/02/2019  . History of amiodarone therapy 01/02/2019  . Hx of CABG 01/02/2019  . Hyperlipidemia 01/02/2019  . PAF (paroxysmal atrial fibrillation) ([place]) 01/02/2019  . Renal artery stenosis ([place]) 01/02/2019  . Sick sinus syndrome ([place]) 01/02/2019   Past Surgical History:  Procedure Laterality Date  . BOWEL RESECTION    . CARDIOVERSION    . CORONARY ANGIOPLASTY WITH STENT PLACEMENT    . CORONARY ARTERY BYPASS GRAFT  2016   x 3 vessels  . CRYOABLATION    . INSERT / REPLACE / REMOVE PACEMAKER       Current Outpatient Medications  Medication Sig Dispense Refill  . albuterol (VENTOLIN HFA) 108 (90 Base) MCG/ACT inhaler  Inhale 2 puffs into the lungs every 6 (six) hours as needed.    Marland Kitchen allopurinol (ZYLOPRIM) 100 MG tablet Take 100 mg by mouth daily.    Marland Kitchen ALPRAZolam (XANAX) 0.25 MG tablet Take 0.25 mg by mouth at bedtime as needed for anxiety.    Marland Kitchen apixaban (ELIQUIS) 5 MG TABS tablet TAKE 1 TABLET TWICE DAILY    . aspirin EC 81 MG tablet Take 1 tablet by mouth daily.    Marland Kitchen atorvastatin (LIPITOR) 40 MG tablet TAKE 1 TABLET EVERY DAY    . carvedilol (COREG) 12.5 MG tablet Take 1 tablet (12.5 mg total) by mouth 2 (two) times daily. 60 tablet 3  . cloNIDine (CATAPRES) 0.1 MG tablet Take 0.1 mg by mouth 2 (two) times daily.    . furosemide (LASIX) 40 MG tablet Take 1 tablet in the morning and 0.5 tablet in the evening    . levothyroxine (SYNTHROID) 75 MCG tablet Take 75 mcg by mouth daily.    Marland Kitchen losartan (COZAAR) 100 MG tablet Take 100 mg by mouth daily.    . nitroGLYCERIN (NITROSTAT) 0.4 MG SL tablet Place 0.4 mg under the tongue every 5 (five) minutes x 3 doses as needed.    . potassium chloride (MICRO-K) 10 MEQ CR capsule Take 10 mEq by mouth daily.    . pramipexole (MIRAPEX) 0.25 MG tablet TAKE 1 TABLET BY MOUTH 2 TO 3 HOURS PRIOR TO BEDTIME    . sotalol (BETAPACE) 80 MG tablet Take 1 tablet by mouth 2 (two) times a day.     Marland Kitchen  vitamin B-12 (CYANOCOBALAMIN) 1000 MCG tablet Take 1,000 mcg by mouth daily.     No current facility-administered medications for this visit.     Allergies:   Tizanidine, Ace inhibitors, Hydrocodone-homatropine, and Rofecoxib   Social History:  The patient  reports that she quit smoking about 11 years ago. Her smoking use included cigarettes. She has a 33.75 pack-year smoking history. She has never used smokeless tobacco. She reports current alcohol use of about 1.0 - 2.0 standard drinks of alcohol per week. She reports that she does not use drugs.   Family History:  The patient's family history includes Heart attack (age of onset: 63) in her father; Hypertension in her daughter, mother,  sister, sister, and son.    ROS:  Please see the history of present illness.   Otherwise, review of systems is positive for none.   All other systems are reviewed and negative.    PHYSICAL EXAM: VS:  BP (!) 164/102   Pulse 78   Ht 5\' 5"  (1.651 m)   Wt 190 lb 9.6 oz (86.5 kg)   SpO2 95%   BMI 31.72 kg/m  , BMI Body mass index is 31.72 kg/m. GEN: Well nourished, well developed, in no acute distress  HEENT: normal  Neck: no JVD, carotid bruits, or masses Cardiac: iRRR; no murmurs, rubs, or gallops,no edema  Respiratory:  clear to auscultation bilaterally, normal work of breathing GI: soft, nontender, nondistended, + BS MS: no deformity or atrophy  Skin: warm and dry, device pocket is well healed Neuro:  Strength and sensation are intact Psych: euthymic mood, full affect  EKG:  EKG is ordered today. Personal review of the ekg ordered shows fibrillation, rate 78, incomplete right bundle branch block, inferior lateral T wave inversions  Device interrogation is reviewed today in detail.  See PaceArt for details.   Recent Labs: 01/03/2019: ALT 19; BUN 29; Creatinine, Ser 1.67; Hemoglobin 15.6; NT-Pro BNP 3,178; Platelets 182; Potassium 4.6; Sodium 137; TSH 1.590    Lipid Panel     Component Value Date/Time   CHOL 167 01/03/2019 1713   TRIG 133 01/03/2019 1713   HDL 61 01/03/2019 1713   CHOLHDL 2.7 01/03/2019 1713   LDLCALC 79 01/03/2019 1713     Wt Readings from Last 3 Encounters:  02/08/19 190 lb 9.6 oz (86.5 kg)  01/03/19 189 lb (85.7 kg)      Other studies Reviewed: Additional studies/ records that were reviewed today include: TTE 01/10/19  Review of the above records today demonstrates:   1. Mild hypokinesis of the left ventricular, basal-mid inferoseptal wall.  2. The left ventricle has low normal systolic function, with an ejection fraction of 50-55%. The cavity size was normal. Left ventricular diastolic Doppler parameters are consistent with pseudonormalization.   3. The right ventricle has normal systolic function. The cavity was normal. There is no increase in right ventricular wall thickness.  4. Left atrial size was mildly dilated.  5. Mild thickening of the aortic valve. Mild calcification of the aortic valve. Aortic valve regurgitation was not assessed by color flow Doppler.  6. The aorta is normal in size and structure.   ASSESSMENT AND PLAN:  1.  Sick sinus syndrome: Status post Saint Jude dual-chamber pacemaker.  Device functioning appropriately.  No changes.  We Diane Valentine her in our device clinic.  2.  Atrial fibrillation, paroxysmal: Status post cryoablation.  Currently on and sotalol.  She is symptomatic with weakness, fatigue, and shortness of breath.  Due to that,  we Diane Valentine plan for repeat ablation.  Risks and benefits were discussed include bleeding, tamponade, heart block, stroke, damage surrounding organs.  She understands these risks and is agreed to the procedure.  This patients CHA2DS2-VASc Score and unadjusted Ischemic Stroke Rate (% per year) is equal to 4.8 % stroke rate/year from a score of 4  Above score calculated as 1 point each if present [CHF, HTN, DM, Vascular=MI/PAD/Aortic Plaque, Age if 65-74, or Female] Above score calculated as 2 points each if present [Age > 75, or Stroke/TIA/TE]  3.  Hypertension: Elevated today, but is in the 130s over 70s at home.  No changes at this time.  4.  Hyperlipidemia: Continue statin per primary cardiology   Current medicines are reviewed at length with the patient today.   The patient does not have concerns regarding her medicines.  The following changes were made today:  none  Labs/ tests ordered today include:  Orders Placed This Encounter  Procedures  . CT CARDIAC MORPH/PULM VEIN W/CM&W/O CA SCORE  . CT CORONARY FRACTIONAL FLOW RESERVE DATA PREP  . CT CORONARY FRACTIONAL FLOW RESERVE FLUID ANALYSIS  . Basic metabolic panel  . CBC  . EKG 12-Lead   Case discussed with referring  cardiologist  Disposition:   FU with Diane Valentine 3 months  Signed, Diane Teater Jorja LoaMartin Nikea Settle, MD  02/08/2019 4:44 PM     [facility] [address] [place] [address] [phone] (office) [phone] (fax)

## 2019-02-08 NOTE — Patient Instructions (Addendum)
Medication Instructions:  Your physician recommends that you continue on your current medications as directed. Please refer to the Current Medication list given to you today.  *If you need a refill on your cardiac medications before your next appointment, please call your pharmacy*  Labwork: None ordered  Testing/Procedures: Your physician has requested that you have cardiac CT within 7 days prior to your ablation. Cardiac computed tomography (CT) is a painless test that uses an x-ray machine to take clear, detailed pictures of your heart. For further information please visit [email or web address]. Please follow instruction below located under special instructions. You will get a call from our office to schedule the date for this test.  Your physician has recommended that you have an ablation. Catheter ablation is a medical procedure used to treat some cardiac arrhythmias (irregular heartbeats). During catheter ablation, a long, thin, flexible tube is put into a blood vessel in your groin (upper thigh), or neck. This tube is called an ablation catheter. It is then guided to your heart through the blood vessel. Radio frequency waves destroy small areas of heart tissue where abnormal heartbeats may cause an arrhythmia to start. Please see the instructions below located under special instructions   Follow-Up: Remote monitoring is used to monitor your Pacemaker or ICD from home. This monitoring reduces the number of office visits required to check your device to one time per year. It allows [place] to keep an eye on the functioning of your device to ensure it is working properly. You are scheduled for a device check from home on 05/10/19. You may send your transmission at any time that day. If you have a wireless device, the transmission will be sent automatically. After your physician reviews your transmission, you will receive a postcard with your next transmission date.  Your physician recommends that you  schedule a follow-up appointment in: 4 weeks, after your procedure on 03/22/19, with Roderic Palau NP in the AFib clinic.  Your physician recommends that you schedule a follow up appointment in: 3 months, after your procedure on 03/22/19, with Dr. Curt Bears.   Thank you for choosing [facility]!!   Trinidad Curet, RN [phone]  Any Other Special Instructions Will Be Listed Below (If Applicable).  CT INSTRUCTIONS Your cardiac CT will be scheduled at one of the below locations:   [facility] [address] [place], [place] [address] [phone] [address] [place], [place] [address] [phone]  Please arrive at the [facility] main entrance of [facility] 30-45 minutes prior to test start time. Proceed to the [facility] Radiology Department (first floor) to check-in and test prep.  Please follow these instructions carefully (unless otherwise directed):  On the Night Before the Test: . Be sure to Drink plenty of water. . Do not consume any caffeinated/decaffeinated beverages or chocolate 12 hours prior to your test. . Do not take any antihistamines 12 hours prior to your test.  On the Day of the Test: . Drink plenty of water. Do not drink any water within one hour of the test. . Do not eat any food 4 hours prior to the test. . You may take your regular medications prior to the test.  . HOLD Furosemide/Hydrochlorothiazide morning of the test. . FEMALES- please wear underwire-free bra if available      After the Test: . Drink plenty of water. . After receiving IV contrast, you may experience a mild flushed  feeling. This is normal. . On occasion, you may experience a mild rash up to 24 hours after the test. This is not dangerous. If this occurs, you can take Benadryl 25 mg and increase your fluid intake. . If you experience trouble breathing, this can be serious. If it is severe  call 911 IMMEDIATELY. If it is mild, please call our office.  Please contact the cardiac imaging nurse navigator should you have any questions/concerns Rockwell AlexandriaSara Wallace, RN Navigator Cardiac Imaging [facility] [phone] Office  [phone] Cell      Electrophysiology/Ablation Procedure Instructions   You are scheduled for a(n) AFib ablation on 03/22/19 with Dr. Loman BrooklynWill Camnitz.   1.   Pre procedure testing-             A.  LAB WORK --- On 03/08/19 you will go to the [facility] office for your pre procedure blood work.  You do not have to be fasting for the blood work               B. COVID TEST-- On 03/19/19 @ 12:30 pm.  [facility] to [facility] ([address], [place]) for your Covid testing.   This is a drive thru test site.  There will be multiple testing areas.  Be sure to share with the first checkpoint that you are there for pre-procedure/surgery testing. This will put you into the right (yellow) lane that leads to the PAT testing team. Stay in your car and the nurse team will come to your car to test you.  After you are tested please go home and self quarantine until the day of your procedure.     2. On the day of your procedure 03/22/19 you will go to [facility] [id number]([address]) at 8:30 a.m.  You will go to the main entrance [employer] and enter where the [employer] parking staff are.  Your driver will drop you off and you will head down the hallway to ADMITTING.  You may have one support person come in to the hospital with you.  They will be asked to wait in the waiting room.   3.   Do not eat or drink after midnight prior to your procedure.   4.   Do NOT take any medications the morning of your procedure.   5.  Plan for an overnight stay.  If you use your phone frequently bring your phone charger.   6. You will follow up with the AFIB clinic 4 weeks after your procedure.  You will follow up with Dr. Elberta Fortisamnitz  3 months after your  procedure.  These appointments will be made for you.   * If you have ANY questions please call the office [phone] and ask for Sherri RN or send me a MyChart message   * Occasionally, EP Studies and ablations can become lengthy.  Please make your family aware of this before your procedure starts.  Average time ranges from 2-8 hours for EP studies/ablations.  Your physician will call your family after the procedure with the results.

## 2019-02-14 ENCOUNTER — Encounter (HOSPITAL_COMMUNITY): Payer: Self-pay

## 2019-02-17 ENCOUNTER — Other Ambulatory Visit: Payer: Self-pay | Admitting: *Deleted

## 2019-02-17 DIAGNOSIS — Z01812 Encounter for preprocedural laboratory examination: Secondary | ICD-10-CM

## 2019-02-17 DIAGNOSIS — I4819 Other persistent atrial fibrillation: Secondary | ICD-10-CM

## 2019-03-09 ENCOUNTER — Ambulatory Visit (INDEPENDENT_AMBULATORY_CARE_PROVIDER_SITE_OTHER): Payer: Medicare Other | Admitting: *Deleted

## 2019-03-09 DIAGNOSIS — I495 Sick sinus syndrome: Secondary | ICD-10-CM

## 2019-03-09 LAB — BASIC METABOLIC PANEL
BUN/Creatinine Ratio: 16 (ref 12–28)
BUN: 27 mg/dL (ref 8–27)
CO2: 24 mmol/L (ref 20–29)
Calcium: 10.3 mg/dL (ref 8.7–10.3)
Chloride: 97 mmol/L (ref 96–106)
Creatinine, Ser: 1.67 mg/dL — ABNORMAL HIGH (ref 0.57–1.00)
GFR calc Af Amer: 36 mL/min/{1.73_m2} — ABNORMAL LOW (ref >59)
GFR calc non Af Amer: 31 mL/min/{1.73_m2} — ABNORMAL LOW (ref >59)
Glucose: 123 mg/dL — ABNORMAL HIGH (ref 65–99)
Potassium: 4.7 mmol/L (ref 3.5–5.2)
Sodium: 135 mmol/L (ref 134–144)

## 2019-03-09 LAB — CUP PACEART REMOTE DEVICE CHECK
Battery Remaining Longevity: 122 mo
Battery Remaining Percentage: 95.5 %
Battery Voltage: 3.01 V
Brady Statistic AP VP Percent: 1.1 %
Brady Statistic AP VS Percent: 1 %
Brady Statistic AS VP Percent: 33 %
Brady Statistic AS VS Percent: 17 %
Brady Statistic RA Percent Paced: 1 %
Brady Statistic RV Percent Paced: 53 %
Date Time Interrogation Session: 20200923060012
Implantable Lead Implant Date: 20190909
Implantable Lead Implant Date: 20190909
Implantable Lead Location: 753859
Implantable Lead Location: 753860
Implantable Pulse Generator Implant Date: 20190909
Lead Channel Impedance Value: 460 Ohm
Lead Channel Impedance Value: 590 Ohm
Lead Channel Pacing Threshold Amplitude: 1 V
Lead Channel Pacing Threshold Amplitude: 1.875 V
Lead Channel Pacing Threshold Pulse Width: 0.4 ms
Lead Channel Pacing Threshold Pulse Width: 0.4 ms
Lead Channel Sensing Intrinsic Amplitude: 12 mV
Lead Channel Sensing Intrinsic Amplitude: 2.2 mV
Lead Channel Setting Pacing Amplitude: 2 V
Lead Channel Setting Pacing Amplitude: 2.5 V
Lead Channel Setting Pacing Pulse Width: 0.4 ms
Lead Channel Setting Sensing Sensitivity: 2 mV
Pulse Gen Model: 2272
Pulse Gen Serial Number: 9053085

## 2019-03-09 LAB — CBC
Hematocrit: 40.7 % (ref 34.0–46.6)
Hemoglobin: 13.9 g/dL (ref 11.1–15.9)
MCH: 31.8 pg (ref 26.6–33.0)
MCHC: 34.2 g/dL (ref 31.5–35.7)
MCV: 93 fL (ref 79–97)
Platelets: 159 10*3/uL (ref 150–450)
RBC: 4.37 x10E6/uL (ref 3.77–5.28)
RDW: 14.4 % (ref 11.7–15.4)
WBC: 5.1 10*3/uL (ref 3.4–10.8)

## 2019-03-11 ENCOUNTER — Telehealth (HOSPITAL_COMMUNITY): Payer: Self-pay | Admitting: Emergency Medicine

## 2019-03-11 NOTE — Telephone Encounter (Signed)
No answering service, unable to leave message.

## 2019-03-11 NOTE — Telephone Encounter (Signed)
Reaching out to patient to offer assistance regarding upcoming cardiac imaging study; pt verbalizes understanding of appt date/time, parking situation and where to check in, pre-test NPO status and medications ordered, and verified current allergies; name and call back number provided for further questions should they arise Marchia Bond RN Navigator Cardiac Imaging [place] [facility] [phone] office [phone] cell  appt in medical day at [place] for IV hydration pre-/post- CTA w/ contrast

## 2019-03-15 ENCOUNTER — Ambulatory Visit (HOSPITAL_COMMUNITY): Payer: Medicare Other

## 2019-03-15 ENCOUNTER — Ambulatory Visit (HOSPITAL_COMMUNITY)
Admission: RE | Admit: 2019-03-15 | Discharge: 2019-03-15 | Disposition: A | Discharge status: Home / Self Care | Payer: Medicare Other | Source: Ambulatory Visit | Attending: Cardiovascular Disease | Admitting: Cardiovascular Disease

## 2019-03-15 ENCOUNTER — Encounter (HOSPITAL_COMMUNITY): Payer: Self-pay

## 2019-03-15 ENCOUNTER — Other Ambulatory Visit: Payer: Self-pay

## 2019-03-15 ENCOUNTER — Ambulatory Visit (HOSPITAL_COMMUNITY)
Admission: RE | Admit: 2019-03-15 | Discharge: 2019-03-15 | Disposition: A | Discharge status: Home / Self Care | Payer: Medicare Other | Source: Ambulatory Visit | Attending: Cardiology | Admitting: Cardiology

## 2019-03-15 ENCOUNTER — Encounter: Payer: Self-pay | Admitting: Cardiology

## 2019-03-15 DIAGNOSIS — I4819 Other persistent atrial fibrillation: Secondary | ICD-10-CM | POA: Insufficient documentation

## 2019-03-15 LAB — BASIC METABOLIC PANEL
Anion gap: 11 (ref 5–15)
BUN: 32 mg/dL — ABNORMAL HIGH (ref 8–23)
CO2: 27 mmol/L (ref 22–32)
Calcium: 10.4 mg/dL — ABNORMAL HIGH (ref 8.9–10.3)
Chloride: 94 mmol/L — ABNORMAL LOW (ref 98–111)
Creatinine, Ser: 2.06 mg/dL — ABNORMAL HIGH (ref 0.44–1.00)
GFR calc Af Amer: 28 mL/min — ABNORMAL LOW (ref >60)
GFR calc non Af Amer: 24 mL/min — ABNORMAL LOW (ref >60)
Glucose, Bld: 140 mg/dL — ABNORMAL HIGH (ref 70–99)
Potassium: 4.7 mmol/L (ref 3.5–5.1)
Sodium: 132 mmol/L — ABNORMAL LOW (ref 135–145)

## 2019-03-15 MED ORDER — SODIUM CHLORIDE 0.9 % WEIGHT BASED INFUSION: 1.0000 mL/kg/h | INTRAVENOUS | Status: DC

## 2019-03-15 MED ORDER — METOPROLOL TARTRATE 5 MG/5ML IV SOLN
INTRAVENOUS | Status: AC
  Filled 2019-03-15: qty 5

## 2019-03-15 MED ORDER — METOPROLOL TARTRATE 5 MG/5ML IV SOLN
5.0000 mg | INTRAVENOUS | Status: DC | PRN
  Administered 2019-03-15 (×4): 5 mg via INTRAVENOUS

## 2019-03-15 MED ORDER — SODIUM CHLORIDE 0.9 % WEIGHT BASED INFUSION
3.0000 mL/kg/h | INTRAVENOUS | Status: AC
  Administered 2019-03-15: 3 mL/kg/h via INTRAVENOUS

## 2019-03-15 MED ORDER — METOPROLOL TARTRATE 5 MG/5ML IV SOLN
INTRAVENOUS | Status: AC
  Administered 2019-03-15: 12:00:00 5 mg via INTRAVENOUS
  Filled 2019-03-15: qty 10

## 2019-03-15 MED ORDER — IOHEXOL 350 MG/ML SOLN
80.0000 mL | Freq: Once | INTRAVENOUS | Status: AC | PRN
  Administered 2019-03-15: 12:00:00 80 mL via INTRAVENOUS

## 2019-03-15 MED ORDER — SODIUM CHLORIDE 0.9 % WEIGHT BASED INFUSION: 3.0000 mL/kg/h | INTRAVENOUS | Status: DC

## 2019-03-15 NOTE — Progress Notes (Signed)
Patient tolerated CT without incident. Gave patient black coffee did not want anything to eat.

## 2019-03-15 NOTE — Progress Notes (Signed)
Remote pacemaker transmission.   

## 2019-03-19 ENCOUNTER — Other Ambulatory Visit (HOSPITAL_COMMUNITY)
Admission: RE | Admit: 2019-03-19 | Discharge: 2019-03-19 | Disposition: A | Discharge status: Home / Self Care | Payer: Medicare Other | Source: Ambulatory Visit | Attending: Cardiology | Admitting: Cardiology

## 2019-03-19 DIAGNOSIS — Z20828 Contact with and (suspected) exposure to other viral communicable diseases: Secondary | ICD-10-CM | POA: Insufficient documentation

## 2019-03-19 DIAGNOSIS — Z01812 Encounter for preprocedural laboratory examination: Secondary | ICD-10-CM | POA: Diagnosis present

## 2019-03-20 LAB — NOVEL CORONAVIRUS, NAA (HOSP ORDER, SEND-OUT TO REF LAB; TAT 18-24 HRS): SARS-CoV-2, NAA: NOT DETECTED

## 2019-03-21 ENCOUNTER — Encounter (HOSPITAL_COMMUNITY): Payer: Self-pay

## 2019-03-22 ENCOUNTER — Other Ambulatory Visit: Payer: Self-pay

## 2019-03-22 ENCOUNTER — Ambulatory Visit (HOSPITAL_COMMUNITY): Payer: Medicare Other | Admitting: Anesthesiology

## 2019-03-22 ENCOUNTER — Ambulatory Visit (HOSPITAL_COMMUNITY)
Admission: RE | Admit: 2019-03-22 | Discharge: 2019-03-23 | Disposition: A | Discharge status: Home / Self Care | Payer: Medicare Other | Attending: Cardiology | Admitting: Cardiology

## 2019-03-22 ENCOUNTER — Encounter (HOSPITAL_COMMUNITY): Payer: Self-pay | Admitting: Anesthesiology

## 2019-03-22 ENCOUNTER — Encounter (HOSPITAL_COMMUNITY)
Admission: RE | Disposition: A | Discharge status: Home / Self Care | Payer: Medicare Other | Source: Home / Self Care | Attending: Cardiology

## 2019-03-22 DIAGNOSIS — Z79890 Hormone replacement therapy (postmenopausal): Secondary | ICD-10-CM | POA: Insufficient documentation

## 2019-03-22 DIAGNOSIS — I4892 Unspecified atrial flutter: Secondary | ICD-10-CM | POA: Diagnosis not present

## 2019-03-22 DIAGNOSIS — I480 Paroxysmal atrial fibrillation: Secondary | ICD-10-CM | POA: Diagnosis present

## 2019-03-22 DIAGNOSIS — Z955 Presence of coronary angioplasty implant and graft: Secondary | ICD-10-CM | POA: Insufficient documentation

## 2019-03-22 DIAGNOSIS — Z951 Presence of aortocoronary bypass graft: Secondary | ICD-10-CM | POA: Insufficient documentation

## 2019-03-22 DIAGNOSIS — Z950 Presence of cardiac pacemaker: Secondary | ICD-10-CM | POA: Insufficient documentation

## 2019-03-22 DIAGNOSIS — Z87891 Personal history of nicotine dependence: Secondary | ICD-10-CM | POA: Diagnosis not present

## 2019-03-22 DIAGNOSIS — I2510 Atherosclerotic heart disease of native coronary artery without angina pectoris: Secondary | ICD-10-CM | POA: Diagnosis not present

## 2019-03-22 DIAGNOSIS — Z8249 Family history of ischemic heart disease and other diseases of the circulatory system: Secondary | ICD-10-CM | POA: Diagnosis not present

## 2019-03-22 DIAGNOSIS — I701 Atherosclerosis of renal artery: Secondary | ICD-10-CM | POA: Insufficient documentation

## 2019-03-22 DIAGNOSIS — I483 Typical atrial flutter: Secondary | ICD-10-CM | POA: Diagnosis not present

## 2019-03-22 DIAGNOSIS — Z79899 Other long term (current) drug therapy: Secondary | ICD-10-CM | POA: Insufficient documentation

## 2019-03-22 DIAGNOSIS — Z7901 Long term (current) use of anticoagulants: Secondary | ICD-10-CM | POA: Diagnosis not present

## 2019-03-22 DIAGNOSIS — I495 Sick sinus syndrome: Secondary | ICD-10-CM | POA: Insufficient documentation

## 2019-03-22 DIAGNOSIS — I10 Essential (primary) hypertension: Secondary | ICD-10-CM | POA: Insufficient documentation

## 2019-03-22 DIAGNOSIS — Z885 Allergy status to narcotic agent status: Secondary | ICD-10-CM | POA: Diagnosis not present

## 2019-03-22 DIAGNOSIS — Z7982 Long term (current) use of aspirin: Secondary | ICD-10-CM | POA: Diagnosis not present

## 2019-03-22 DIAGNOSIS — E785 Hyperlipidemia, unspecified: Secondary | ICD-10-CM | POA: Insufficient documentation

## 2019-03-22 HISTORY — PX: ATRIAL FIBRILLATION ABLATION: EP1191

## 2019-03-22 LAB — POCT ACTIVATED CLOTTING TIME
Activated Clotting Time: 368 seconds
Activated Clotting Time: 158 seconds

## 2019-03-22 SURGERY — ATRIAL FIBRILLATION ABLATION
Anesthesia: General

## 2019-03-22 MED ORDER — HYDRALAZINE HCL 20 MG/ML IJ SOLN
INTRAMUSCULAR | Status: AC
  Administered 2019-03-22: 11:00:00 10 mg via INTRAVENOUS
  Filled 2019-03-22: qty 1

## 2019-03-22 MED ORDER — CARVEDILOL 12.5 MG PO TABS
12.5000 mg | ORAL_TABLET | Freq: Two times a day (BID) | ORAL | Status: DC
  Administered 2019-03-22 – 2019-03-23 (×2): 12.5 mg via ORAL
  Filled 2019-03-22 (×2): qty 1

## 2019-03-22 MED ORDER — CLONIDINE HCL 0.1 MG PO TABS
0.1000 mg | ORAL_TABLET | Freq: Two times a day (BID) | ORAL | Status: DC
  Administered 2019-03-22 – 2019-03-23 (×2): 0.1 mg via ORAL
  Filled 2019-03-22 (×2): qty 1

## 2019-03-22 MED ORDER — SODIUM CHLORIDE 0.9 % IV SOLN
INTRAVENOUS | Status: DC
  Administered 2019-03-22 (×2): via INTRAVENOUS

## 2019-03-22 MED ORDER — SODIUM CHLORIDE 0.9% FLUSH
3.0000 mL | Freq: Two times a day (BID) | INTRAVENOUS | Status: DC
  Administered 2019-03-22 – 2019-03-23 (×2): 3 mL via INTRAVENOUS

## 2019-03-22 MED ORDER — ONDANSETRON HCL 4 MG/2ML IJ SOLN: 4.0000 mg | Freq: Four times a day (QID) | INTRAMUSCULAR | Status: DC | PRN

## 2019-03-22 MED ORDER — ATORVASTATIN CALCIUM 40 MG PO TABS
40.0000 mg | ORAL_TABLET | Freq: Every day | ORAL | Status: DC
  Administered 2019-03-22 – 2019-03-23 (×2): 40 mg via ORAL
  Filled 2019-03-22 (×2): qty 1

## 2019-03-22 MED ORDER — HYDRALAZINE HCL 20 MG/ML IJ SOLN: 10.0000 mg | INTRAMUSCULAR | Status: DC | PRN

## 2019-03-22 MED ORDER — SODIUM CHLORIDE 0.9% FLUSH: 3.0000 mL | INTRAVENOUS | Status: DC | PRN

## 2019-03-22 MED ORDER — BUPIVACAINE HCL (PF) 0.25 % IJ SOLN
INTRAMUSCULAR | Status: DC | PRN
  Administered 2019-03-22: 30 mL

## 2019-03-22 MED ORDER — HYDRALAZINE HCL 20 MG/ML IJ SOLN
INTRAMUSCULAR | Status: DC | PRN
  Administered 2019-03-22: 5 mg via INTRAVENOUS

## 2019-03-22 MED ORDER — CEFAZOLIN SODIUM-DEXTROSE 2-4 GM/100ML-% IV SOLN
INTRAVENOUS | Status: AC
  Filled 2019-03-22: qty 100

## 2019-03-22 MED ORDER — HEPARIN SODIUM (PORCINE) 1000 UNIT/ML IJ SOLN
INTRAMUSCULAR | Status: DC | PRN
  Administered 2019-03-22: 1000 [IU] via INTRAVENOUS

## 2019-03-22 MED ORDER — LOSARTAN POTASSIUM 50 MG PO TABS
100.0000 mg | ORAL_TABLET | Freq: Every day | ORAL | Status: DC
  Administered 2019-03-22 – 2019-03-23 (×2): 100 mg via ORAL
  Filled 2019-03-22 (×2): qty 2

## 2019-03-22 MED ORDER — DEXAMETHASONE SODIUM PHOSPHATE 4 MG/ML IJ SOLN
INTRAMUSCULAR | Status: DC | PRN
  Administered 2019-03-22: 10 mg via INTRAVENOUS

## 2019-03-22 MED ORDER — PROTAMINE SULFATE 10 MG/ML IV SOLN
INTRAVENOUS | Status: DC | PRN
  Administered 2019-03-22: 50 mg via INTRAVENOUS

## 2019-03-22 MED ORDER — MIDAZOLAM HCL 5 MG/5ML IJ SOLN
INTRAMUSCULAR | Status: DC | PRN
  Administered 2019-03-22: 2 mg via INTRAVENOUS

## 2019-03-22 MED ORDER — SODIUM CHLORIDE 0.9 % IV SOLN: 250.0000 mL | INTRAVENOUS | Status: DC | PRN

## 2019-03-22 MED ORDER — APIXABAN 5 MG PO TABS
5.0000 mg | ORAL_TABLET | Freq: Two times a day (BID) | ORAL | Status: DC
  Administered 2019-03-22 – 2019-03-23 (×2): 5 mg via ORAL
  Filled 2019-03-22 (×2): qty 1

## 2019-03-22 MED ORDER — LEVOTHYROXINE SODIUM 75 MCG PO TABS
75.0000 ug | ORAL_TABLET | Freq: Every day | ORAL | Status: DC
  Administered 2019-03-23: 75 ug via ORAL
  Filled 2019-03-22: qty 1

## 2019-03-22 MED ORDER — SIMETHICONE 80 MG PO CHEW
80.0000 mg | CHEWABLE_TABLET | Freq: Four times a day (QID) | ORAL | Status: DC | PRN
  Administered 2019-03-22: 80 mg via ORAL
  Filled 2019-03-22: qty 1

## 2019-03-22 MED ORDER — ALBUTEROL SULFATE (2.5 MG/3ML) 0.083% IN NEBU: 3.0000 mL | INHALATION_SOLUTION | Freq: Four times a day (QID) | RESPIRATORY_TRACT | Status: DC | PRN

## 2019-03-22 MED ORDER — VITAMIN B-12 1000 MCG PO TABS
1000.0000 ug | ORAL_TABLET | Freq: Every day | ORAL | Status: DC
  Administered 2019-03-23: 1000 ug via ORAL
  Filled 2019-03-22: qty 1

## 2019-03-22 MED ORDER — BUPIVACAINE HCL (PF) 0.25 % IJ SOLN
INTRAMUSCULAR | Status: AC
  Filled 2019-03-22: qty 30

## 2019-03-22 MED ORDER — CEFAZOLIN SODIUM-DEXTROSE 2-3 GM-%(50ML) IV SOLR
INTRAVENOUS | Status: DC | PRN
  Administered 2019-03-22: 2 g via INTRAVENOUS

## 2019-03-22 MED ORDER — ACETAMINOPHEN 325 MG PO TABS
ORAL_TABLET | ORAL | Status: AC
  Filled 2019-03-22: qty 2

## 2019-03-22 MED ORDER — NITROGLYCERIN 0.4 MG SL SUBL: 0.4000 mg | SUBLINGUAL_TABLET | SUBLINGUAL | Status: DC | PRN

## 2019-03-22 MED ORDER — PRAMIPEXOLE DIHYDROCHLORIDE 0.25 MG PO TABS
0.2500 mg | ORAL_TABLET | Freq: Every evening | ORAL | Status: DC
  Administered 2019-03-22: 0.25 mg via ORAL
  Filled 2019-03-22 (×2): qty 1

## 2019-03-22 MED ORDER — ASPIRIN EC 81 MG PO TBEC
81.0000 mg | DELAYED_RELEASE_TABLET | Freq: Every day | ORAL | Status: DC
  Administered 2019-03-23: 09:00:00 81 mg via ORAL
  Filled 2019-03-22: qty 1

## 2019-03-22 MED ORDER — ROCURONIUM BROMIDE 10 MG/ML (PF) SYRINGE
PREFILLED_SYRINGE | INTRAVENOUS | Status: DC | PRN
  Administered 2019-03-22: 80 mg via INTRAVENOUS

## 2019-03-22 MED ORDER — HEPARIN SODIUM (PORCINE) 1000 UNIT/ML IJ SOLN
INTRAMUSCULAR | Status: DC | PRN
  Administered 2019-03-22: 14000 [IU] via INTRAVENOUS

## 2019-03-22 MED ORDER — PROPOFOL 10 MG/ML IV BOLUS
INTRAVENOUS | Status: DC | PRN
  Administered 2019-03-22: 50 mg via INTRAVENOUS
  Administered 2019-03-22: 140 mg via INTRAVENOUS
  Administered 2019-03-22: 60 mg via INTRAVENOUS

## 2019-03-22 MED ORDER — FENTANYL CITRATE (PF) 100 MCG/2ML IJ SOLN
INTRAMUSCULAR | Status: DC | PRN
  Administered 2019-03-22: 50 ug via INTRAVENOUS

## 2019-03-22 MED ORDER — HEPARIN SODIUM (PORCINE) 1000 UNIT/ML IJ SOLN
INTRAMUSCULAR | Status: AC
  Filled 2019-03-22: qty 1

## 2019-03-22 MED ORDER — SOTALOL HCL 80 MG PO TABS
80.0000 mg | ORAL_TABLET | Freq: Two times a day (BID) | ORAL | Status: DC
  Administered 2019-03-22 – 2019-03-23 (×2): 80 mg via ORAL
  Filled 2019-03-22 (×2): qty 1

## 2019-03-22 MED ORDER — HYDRALAZINE HCL 20 MG/ML IJ SOLN
10.0000 mg | INTRAMUSCULAR | Status: DC | PRN
  Administered 2019-03-22: 10 mg via INTRAVENOUS
  Filled 2019-03-22: qty 1

## 2019-03-22 MED ORDER — LIDOCAINE 2% (20 MG/ML) 5 ML SYRINGE
INTRAMUSCULAR | Status: DC | PRN
  Administered 2019-03-22: 100 mg via INTRAVENOUS

## 2019-03-22 MED ORDER — ACETAMINOPHEN 325 MG PO TABS
650.0000 mg | ORAL_TABLET | ORAL | Status: DC | PRN
  Administered 2019-03-22 (×2): 650 mg via ORAL
  Filled 2019-03-22: qty 2

## 2019-03-22 MED ORDER — PHENYLEPHRINE 40 MCG/ML (10ML) SYRINGE FOR IV PUSH (FOR BLOOD PRESSURE SUPPORT)
PREFILLED_SYRINGE | INTRAVENOUS | Status: DC | PRN
  Administered 2019-03-22: 120 ug via INTRAVENOUS
  Administered 2019-03-22: 80 ug via INTRAVENOUS
  Administered 2019-03-22 (×2): 120 ug via INTRAVENOUS
  Administered 2019-03-22 (×2): 80 ug via INTRAVENOUS

## 2019-03-22 MED ORDER — POTASSIUM CHLORIDE CRYS ER 10 MEQ PO TBCR
10.0000 meq | EXTENDED_RELEASE_TABLET | Freq: Every day | ORAL | Status: DC
  Administered 2019-03-23: 10 meq via ORAL
  Filled 2019-03-22: qty 1

## 2019-03-22 MED ORDER — HEPARIN (PORCINE) IN NACL 2-0.9 UNITS/ML
INTRAMUSCULAR | Status: AC | PRN
  Administered 2019-03-22 (×3): 500 mL

## 2019-03-22 MED ORDER — DOBUTAMINE IN D5W 4-5 MG/ML-% IV SOLN
INTRAVENOUS | Status: DC | PRN
  Administered 2019-03-22: 20 ug/kg/min via INTRAVENOUS

## 2019-03-22 MED ORDER — HEPARIN (PORCINE) IN NACL 2-0.9 UNITS/ML
INTRAMUSCULAR | Status: AC | PRN
  Administered 2019-03-22: 500 mL

## 2019-03-22 MED ORDER — SUGAMMADEX SODIUM 200 MG/2ML IV SOLN
INTRAVENOUS | Status: DC | PRN
  Administered 2019-03-22: 200 mg via INTRAVENOUS

## 2019-03-22 MED ORDER — ONDANSETRON HCL 4 MG/2ML IJ SOLN
INTRAMUSCULAR | Status: DC | PRN
  Administered 2019-03-22: 4 mg via INTRAVENOUS

## 2019-03-22 MED ORDER — ALLOPURINOL 100 MG PO TABS
100.0000 mg | ORAL_TABLET | Freq: Every day | ORAL | Status: DC
  Administered 2019-03-22 – 2019-03-23 (×2): 100 mg via ORAL
  Filled 2019-03-22 (×2): qty 1

## 2019-03-22 MED ORDER — ALPRAZOLAM 0.25 MG PO TABS
0.2500 mg | ORAL_TABLET | Freq: Every evening | ORAL | Status: DC | PRN
  Administered 2019-03-22: 0.25 mg via ORAL
  Filled 2019-03-22: qty 1

## 2019-03-22 MED ORDER — HYDRALAZINE HCL 20 MG/ML IJ SOLN
10.0000 mg | Freq: Once | INTRAMUSCULAR | Status: AC
  Administered 2019-03-22: 11:00:00 10 mg via INTRAVENOUS

## 2019-03-22 MED ORDER — DOBUTAMINE IN D5W 4-5 MG/ML-% IV SOLN
INTRAVENOUS | Status: AC
  Filled 2019-03-22: qty 250

## 2019-03-22 SURGICAL SUPPLY — 21 items
BLANKET WARM UNDERBOD FULL ACC (MISCELLANEOUS) ×3 IMPLANT
CATH MAPPNG PENTARAY F 2-6-2MM (CATHETERS) IMPLANT
CATH SMTCH THERMOCOOL SF DF (CATHETERS) ×2 IMPLANT
CATH SOUNDSTAR 3D IMAGING (CATHETERS) ×2 IMPLANT
CATH WEBSTER BI DIR CS D-F CRV (CATHETERS) ×2 IMPLANT
COVER SWIFTLINK CONNECTOR (BAG) ×3 IMPLANT
PACK EP LATEX FREE (CUSTOM PROCEDURE TRAY) ×2
PACK EP LF (CUSTOM PROCEDURE TRAY) ×1 IMPLANT
PAD PRO RADIOLUCENT 2001M-C (PAD) ×3 IMPLANT
PATCH CARTO3 (PAD) ×2 IMPLANT
PENTARAY F 2-6-2MM (CATHETERS) ×3
SHEATH AVANTI 11F 11CM (SHEATH) ×2 IMPLANT
SHEATH BAYLIS SUREFLEX  M 8.5 (SHEATH) ×2
SHEATH BAYLIS SUREFLEX M 8.5 (SHEATH) IMPLANT
SHEATH BAYLIS TRANSSEPTAL 98CM (NEEDLE) ×2 IMPLANT
SHEATH CARTO VIZIGO SM CVD (SHEATH) ×2 IMPLANT
SHEATH PINNACLE 7F 10CM (SHEATH) ×2 IMPLANT
SHEATH PINNACLE 8F 10CM (SHEATH) ×4 IMPLANT
SHEATH PINNACLE 9F 10CM (SHEATH) ×4 IMPLANT
SHEATH PROBE COVER 6X72 (BAG) ×2 IMPLANT
TUBING SMART ABLATE COOLFLOW (TUBING) ×2 IMPLANT

## 2019-03-22 NOTE — Discharge Summary (Addendum)
ELECTROPHYSIOLOGY PROCEDURE DISCHARGE SUMMARY    Patient ID: Diane Valentine,  MRN: [id number], DOB/AGE: [birth year] 68 y.o.  Admit date: 03/22/2019 Discharge date: 03/23/19   Primary Care Physician: Forrest Moronuehle, Stephen, MD  Primary Cardiologist: No primary care provider on file.  Electrophysiologist: Dr. Elberta Fortisamnitz  Primary Discharge Diagnosis:  Paroxysmal Atrial Fibrillation  Secondary Discharge Diagnosis:  Typical appearing atrial flutter  Procedures This Admission:  1.  Electrophysiology study and radiofrequency catheter ablation of Atrial Fibrillation and atrial flutter on 03/22/19 by Dr. Elberta Fortisamnitz.  This study demonstrated; i. Sinus rhythm upon presentation.   ii. Successful electrical isolation and anatomical encircling of all four pulmonary veins with radiofrequency current.    iii. Cavo-tricuspid isthmus ablation was performed with complete bidirectional isthmus block achieved.  iv. No inducible arrhythmias following ablation both on and off of dobutamine v. No early apparent complications.  Brief HPI: Diane Valentine is a 68 y.o. female with a history of paroxysmal Atrial Fibrillation.  They have failed medical therapy with sotalol and previous ablation. Risks, benefits, and alternatives to catheter ablation of Atrial Fibrillation were reviewed with the patient who wished to proceed.  The patient underwent intracardiac echocardiography and was adequately anticoagulated leading up to the procedure.   Hospital Course:  The patient was admitted and underwent EPS/RFCA of Atrial Fibrillation and typical appearing atrial flutter with details as outlined above.  They were monitored on telemetry overnight which demonstrated NSR.  Groin was without complication on the day of discharge.  The patient was examined and considered to be stable for discharge.  Wound care and restrictions were reviewed with the patient.  The patient Diane Valentine be seen back by Rudi Cocoonna Carroll, NP in 4 weeks and Dr. Elberta Fortisamnitz in 12  weeks for post ablation follow up.   This patients CHA2DS2-VASc Score and unadjusted Ischemic Stroke Rate (% per year) is equal to 4.8 % stroke rate/year from a score of 4 Above score calculated as 1 point each if present [CHF, HTN, DM, Vascular=MI/PAD/Aortic Plaque, Age if 65-74, or Female] Above score calculated as 2 points each if present [Age > 75, or Stroke/TIA/TE]          Physical Exam: Vitals:   03/22/19 2353 03/23/19 0602 03/23/19 0815 03/23/19 0832  BP: 119/69 (!) 162/78 98/78 (!) 148/58  Pulse: 64 (!) 58 68 67  Resp: 16 16 20    Temp: 98.4 F (36.9 C) 98.6 F (37 C) 97.7 F (36.5 C)   TempSrc: Oral Oral Oral   SpO2: 95% 95% 97%   Weight:  88.3 kg    Height:        GEN- The patient is well appearing, alert and oriented x 3 today.   HEENT: normocephalic, atraumatic; sclera clear, conjunctiva pink; hearing intact; oropharynx clear; neck supple  Lungs- Clear to ausculation bilaterally, normal work of breathing.  No wheezes, rales, rhonchi Heart- Regular rate and rhythm, no murmurs, rubs or gallops  GI- soft, non-tender, non-distended, bowel sounds present  Extremities- no clubbing, cyanosis, or edema; DP/PT/radial pulses 2+ bilaterally, groin without hematoma/bruit MS- no significant deformity or atrophy Skin- warm and dry, no rash or lesion Psych- euthymic mood, full affect Neuro- strength and sensation are intact   Labs:   Lab Results  Component Value Date   WBC 5.1 03/08/2019   HGB 13.9 03/08/2019   HCT 40.7 03/08/2019   MCV 93 03/08/2019   PLT 159 03/08/2019   No results for input(s): NA, K, CL, CO2, BUN, CREATININE, CALCIUM, PROT, BILITOT, ALKPHOS, ALT,  AST, GLUCOSE in the last 168 hours.  Invalid input(s): LABALBU   Discharge Medications:  Allergies as of 03/23/2019      Reactions   Tizanidine Itching, Other (See Comments)   "didnt like the way it made her feel" -- weakness "didnt like the way it made her feel" -- weakness   Ace Inhibitors Other  (See Comments)   unknown Cannot recall   Hydrocodone-homatropine Nausea And Vomiting   Rofecoxib Nausea Only   MADE ME FEEL WEIRD      Medication List    TAKE these medications   acetaminophen 325 MG tablet Commonly known as: TYLENOL Take 2 tablets (650 mg total) by mouth every 4 (four) hours as needed for headache or mild pain.   albuterol 108 (90 Base) MCG/ACT inhaler Commonly known as: VENTOLIN HFA Inhale 2 puffs into the lungs every 6 (six) hours as needed for wheezing or shortness of breath.   allopurinol 100 MG tablet Commonly known as: ZYLOPRIM Take 100 mg by mouth daily.   ALPRAZolam 0.25 MG tablet Commonly known as: XANAX Take 0.25 mg by mouth at bedtime as needed for anxiety.   apixaban 5 MG Tabs tablet Commonly known as: ELIQUIS TAKE 1 TABLET TWICE DAILY   aspirin EC 81 MG tablet Take 1 tablet by mouth daily.   atorvastatin 40 MG tablet Commonly known as: LIPITOR TAKE 1 TABLET EVERY DAY   carvedilol 12.5 MG tablet Commonly known as: COREG Take 1 tablet (12.5 mg total) by mouth 2 (two) times daily.   cloNIDine 0.1 MG tablet Commonly known as: CATAPRES Take 0.1 mg by mouth 2 (two) times daily.   furosemide 40 MG tablet Commonly known as: LASIX Take 1 tablet in the morning and 0.5 tablet in the evening   levothyroxine 75 MCG tablet Commonly known as: SYNTHROID Take 75 mcg by mouth daily before breakfast.   losartan 100 MG tablet Commonly known as: COZAAR Take 100 mg by mouth daily.   nitroGLYCERIN 0.4 MG SL tablet Commonly known as: NITROSTAT Place 0.4 mg under the tongue every 5 (five) minutes x 3 doses as needed.   pantoprazole 40 MG tablet Commonly known as: Protonix Take 1 tablet (40 mg total) by mouth daily. For 6 weeks s/p ablation   potassium chloride 10 MEQ CR capsule Commonly known as: MICRO-K Take 10 mEq by mouth daily.   pramipexole 0.25 MG tablet Commonly known as: MIRAPEX Take 0.25 mg by mouth every evening.   sotalol 80  MG tablet Commonly known as: BETAPACE Take 1 tablet by mouth 2 (two) times a day.   vitamin B-12 1000 MCG tablet Commonly known as: CYANOCOBALAMIN Take 1,000 mcg by mouth daily.       Disposition:   Follow-up Information    Constance Haw, MD Follow up on 06/27/2019.   Specialty: Cardiology Why: at 2 pm for 3 month post ablation follow up Contact information: [address] [place] [address] [place] Follow up on 04/19/2019.   Specialty: Cardiology Why: at 130 pm for post ablation follow up Contact information: [address] [id number] [place] [address] [phone]          Duration of Discharge Encounter: Greater than 30 minutes including physician time.  Signed, Shirley Friar, PA-C  03/23/2019 9:10 AM   I have seen and examined this patient with Oda Kilts.  Agree with above, note added to reflect my  findings.  On exam, RRR, no murmurs, lungs clear.  Patient had AF ablation for paroxysmal atrial fibrillation/flutter. Tolerated the procedure well without complaint this morning. Plan for discharge today with follow up in EP clinic.  Trey Bebee M. Dimitrius Steedman MD 03/23/2019 10:13 AM

## 2019-03-22 NOTE — Anesthesia Preprocedure Evaluation (Signed)
Anesthesia Evaluation  Patient identified by MRN, date of birth, ID band Patient awake    Reviewed: Allergy & Precautions, NPO status , Patient's Chart, lab work & pertinent test results  Airway Mallampati: I  TM Distance: >3 FB Neck ROM: Full    Dental   Pulmonary former smoker,    Pulmonary exam normal        Cardiovascular hypertension, Pt. on medications + CAD and + CABG  Normal cardiovascular exam     Neuro/Psych    GI/Hepatic   Endo/Other    Renal/GU      Musculoskeletal   Abdominal   Peds  Hematology   Anesthesia Other Findings   Reproductive/Obstetrics                             Anesthesia Physical Anesthesia Plan  ASA: III  Anesthesia Plan: General   Post-op Pain Management:    Induction: Intravenous  PONV Risk Score and Plan: 3 and Ondansetron and Treatment may vary due to age or medical condition  Airway Management Planned: LMA  Additional Equipment:   Intra-op Plan:   Post-operative Plan: Extubation in OR  Informed Consent: I have reviewed the patients History and Physical, chart, labs and discussed the procedure including the risks, benefits and alternatives for the proposed anesthesia with the patient or authorized representative who has indicated his/her understanding and acceptance.       Plan Discussed with: CRNA and Surgeon  Anesthesia Plan Comments:         Anesthesia Quick Evaluation

## 2019-03-22 NOTE — Progress Notes (Signed)
Dr Lawana Pai called and informed of BP (146/88)States to call CRNA April and let her know  Alana, CRNA called and informed states she will let April know.

## 2019-03-22 NOTE — Transfer of Care (Signed)
Immediate Anesthesia Transfer of Care Note  Patient: Diane Valentine  Procedure(s) Performed: ATRIAL FIBRILLATION ABLATION (N/A )  Patient Location: PACU  Anesthesia Type:General  Level of Consciousness: awake  Airway & Oxygen Therapy: Patient Spontanous Breathing and Patient connected to face mask oxygen  Post-op Assessment: Report given to RN and Post -op Vital signs reviewed and stable  Post vital signs: Reviewed and stable  Last Vitals:  Vitals Value Taken Time  BP 127/58 03/22/19 1428  Temp 36.5 C 03/22/19 1428  Pulse 60 03/22/19 1429  Resp 15 03/22/19 1429  SpO2 97 % 03/22/19 1429  Vitals shown include unvalidated device data.  Last Pain:  Vitals:   03/22/19 1428  TempSrc: Tympanic         Complications: No apparent anesthesia complications

## 2019-03-22 NOTE — Progress Notes (Addendum)
Pt BP 172/84.  PRN hydralazine not due and last given at 1718.  Patient also requesting something for gas.  Cardiology PA notified.   1954-received order for simethicone and verbal order to give patient scheduled clonidine now.

## 2019-03-22 NOTE — Progress Notes (Signed)
Site area: Right groin a 9 french venous sheaths X2 was removed  Site Prior to Removal:  Level 0  Pressure Applied For 20 MINUTES    Bedrest Beginning at 1615p  Manual:   Yes.    Patient Status During Pull:  stable  Post Pull Groin Site:  Level 0  Post Pull Instructions Given:  Yes.    Post Pull Pulses Present:  Yes.    Dressing Applied:  Yes.    Comments:

## 2019-03-22 NOTE — H&P (Signed)
Electrophysiology Office Note   Date:  03/22/2019   ID:  Diane Valentine, DOB 1950-12-04, MRN [id number]  PCP:  Charleston Poot, MD  Cardiologist:  Bettina Gavia Primary Electrophysiologist:  Luian Schumpert Meredith Leeds, MD    No chief complaint on file.    History of Present Illness: Diane Valentine is a 68 y.o. female who is being seen today for the evaluation of pacemaker at the request of No ref. provider found. Presenting today for electrophysiology evaluation.  She has a history significant for paroxysmal atrial fibrillation, sick sinus syndrome status post Jude pacemaker, coronary artery disease status post CABG, hypertension, renal artery stenosis, hyperlipidemia.  She has had a cryoablation in 2019 for atrial fibrillation and is currently on sotalol.  Her main symptoms are weakness, fatigue, and mild shortness of breath.  She also has palpitations at times.  She feels that she has been out of rhythm for quite some time.  Today, denies symptoms of palpitations, chest pain, shortness of breath, orthopnea, PND, lower extremity edema, claudication, dizziness, presyncope, syncope, bleeding, or neurologic sequela. The patient is tolerating medications without difficulties.     Past Medical History:  Diagnosis Date  . CAD in native artery 01/02/2019  . Carotid stenosis, bilateral 01/02/2019  . Essential hypertension 01/02/2019  . History of amiodarone therapy 01/02/2019  . Hx of CABG 01/02/2019  . Hyperlipidemia 01/02/2019  . PAF (paroxysmal atrial fibrillation) ([facility]) 01/02/2019  . Renal artery stenosis ([facility]) 01/02/2019  . Sick sinus syndrome ([facility]) 01/02/2019   Past Surgical History:  Procedure Laterality Date  . BOWEL RESECTION    . CARDIOVERSION    . CORONARY ANGIOPLASTY WITH STENT PLACEMENT    . CORONARY ARTERY BYPASS GRAFT  2016   x 3 vessels  . CRYOABLATION    . INSERT / REPLACE / REMOVE PACEMAKER       Current Facility-Administered Medications  Medication Dose Route Frequency Provider  Last Rate Last Dose  . 0.9 %  sodium chloride infusion   Intravenous Continuous Constance Haw, MD 50 mL/hr at 03/22/19 0810      Allergies:   Tizanidine, Ace inhibitors, Hydrocodone-homatropine, and Rofecoxib   Social History:  The patient  reports that she quit smoking about 11 years ago. Her smoking use included cigarettes. She has a 33.75 pack-year smoking history. She has never used smokeless tobacco. She reports current alcohol use of about 1.0 - 2.0 standard drinks of alcohol per week. She reports that she does not use drugs.   Family History:  The patient's family history includes Heart attack (age of onset: 81) in her father; Hypertension in her daughter, mother, sister, sister, and son.    ROS:  Please see the history of present illness.   Otherwise, review of systems is positive for none.   All other systems are reviewed and negative.    PHYSICAL EXAM: VS:  BP (!) 200/124   Pulse 96   Temp 97.7 F (36.5 C) (Skin)   Resp 14   Ht 5\' 5"  (1.651 m)   Wt 88.5 kg   SpO2 98%   BMI 32.45 kg/m  , BMI Body mass index is 32.45 kg/m.  GEN: Well nourished, well developed, in no acute distress  HEENT: normal  Neck: no JVD, carotid bruits, or masses Cardiac: RRR; no murmurs, rubs, or gallops,no edema  Respiratory:  clear to auscultation bilaterally, normal work of breathing GI: soft, nontender, nondistended, + BS MS: no deformity or atrophy  Skin: warm and dry Neuro:  Strength and  sensation are intact Psych: euthymic mood, full affect   Recent Labs: 01/03/2019: ALT 19; NT-Pro BNP 3,178; TSH 1.590 03/08/2019: Hemoglobin 13.9; Platelets 159 03/15/2019: BUN 32; Creatinine, Ser 2.06; Potassium 4.7; Sodium 132    Lipid Panel     Component Value Date/Time   CHOL 167 01/03/2019 1713   TRIG 133 01/03/2019 1713   HDL 61 01/03/2019 1713   CHOLHDL 2.7 01/03/2019 1713   LDLCALC 79 01/03/2019 1713     Wt Readings from Last 3 Encounters:  03/22/19 88.5 kg  03/15/19 87.5 kg   02/08/19 86.5 kg      Other studies Reviewed: Additional studies/ records that were reviewed today include: TTE 01/10/19  Review of the above records today demonstrates:   1. Mild hypokinesis of the left ventricular, basal-mid inferoseptal wall.  2. The left ventricle has low normal systolic function, with an ejection fraction of 50-55%. The cavity size was normal. Left ventricular diastolic Doppler parameters are consistent with pseudonormalization.  3. The right ventricle has normal systolic function. The cavity was normal. There is no increase in right ventricular wall thickness.  4. Left atrial size was mildly dilated.  5. Mild thickening of the aortic valve. Mild calcification of the aortic valve. Aortic valve regurgitation was not assessed by color flow Doppler.  6. The aorta is normal in size and structure.   ASSESSMENT AND PLAN:  1.  Sick sinus syndrome: Status post Saint Jude dual-chamber pacemaker.  Device functioning appropriately.  No changes.  We Vedder Brittian her in our device clinic.  2.  Atrial fibrillation, paroxysmal: Had cryoablation prevoiusly with recurrent AF. Zelia Yzaguirre plan for repeat ablation today.  Oluwatoni Rotunno has presented today for surgery, with the diagnosis of atrial fibrillation.  The various methods of treatment have been discussed with the patient and family. After consideration of risks, benefits and other options for treatment, the patient has consented to  Procedure(s): caheter ablation as a surgical intervention .  Risks include but not limited to bleeding, tamponade, heart block, stroke, damage to surrounding organs, among others. The patient's history has been reviewed, patient examined, no change in status, stable for surgery.  I have reviewed the patient's chart and labs.  Questions were answered to the patient's satisfaction.    Camesha Farooq Elberta Fortis, MD 03/22/2019 11:03 AM

## 2019-03-22 NOTE — Anesthesia Procedure Notes (Signed)
Procedure Name: Intubation Date/Time: 03/22/2019 12:07 PM Performed by: Eulas Post, Jakiyah Stepney W, CRNA Pre-anesthesia Checklist: Patient identified, Emergency Drugs available, Suction available and Patient being monitored Patient Re-evaluated:Patient Re-evaluated prior to induction Oxygen Delivery Method: Circle system utilized Preoxygenation: Pre-oxygenation with 100% oxygen Induction Type: IV induction Ventilation: Mask ventilation without difficulty Laryngoscope Size: Miller and 2 Grade View: Grade I Tube type: Oral Tube size: 7.0 mm Number of attempts: 1 Airway Equipment and Method: Stylet and Oral airway Placement Confirmation: ETT inserted through vocal cords under direct vision,  positive ETCO2 and breath sounds checked- equal and bilateral Secured at: 21 cm Tube secured with: Tape Dental Injury: Teeth and Oropharynx as per pre-operative assessment

## 2019-03-22 NOTE — Anesthesia Postprocedure Evaluation (Signed)
Anesthesia Post Note  Patient: Diane Valentine  Procedure(s) Performed: ATRIAL FIBRILLATION ABLATION (N/A )     Patient location during evaluation: PACU Anesthesia Type: General Level of consciousness: awake and alert Pain management: pain level controlled Vital Signs Assessment: post-procedure vital signs reviewed and stable Respiratory status: spontaneous breathing, nonlabored ventilation, respiratory function stable and patient connected to nasal cannula oxygen Cardiovascular status: blood pressure returned to baseline and stable Postop Assessment: no apparent nausea or vomiting Anesthetic complications: no    Last Vitals:  Vitals:   03/22/19 1440 03/22/19 1445  BP: (!) 125/49 (!) 131/57  Pulse: 61 63  Resp: 11 11  Temp:    SpO2: 97% 96%    Last Pain:  Vitals:   03/22/19 1428  TempSrc: Tympanic                 [name]

## 2019-03-22 NOTE — Progress Notes (Signed)
Site area: Left groin a 7, and 11 french venous sheath was removed   Site Prior to Removal:  Level 0  Pressure Applied For 20 MINUTES    Bedrest Beginning at 1615p  Manual:   Yes.    Patient Status During Pull:  stable  Post Pull Groin Site:  Level 0  Post Pull Instructions Given:  Yes.    Post Pull Pulses Present:  Yes.    Dressing Applied:  Yes.    Comments:

## 2019-03-23 ENCOUNTER — Encounter (HOSPITAL_COMMUNITY): Payer: Self-pay | Admitting: Cardiology

## 2019-03-23 DIAGNOSIS — I480 Paroxysmal atrial fibrillation: Secondary | ICD-10-CM

## 2019-03-23 DIAGNOSIS — I2510 Atherosclerotic heart disease of native coronary artery without angina pectoris: Secondary | ICD-10-CM | POA: Diagnosis not present

## 2019-03-23 DIAGNOSIS — I4892 Unspecified atrial flutter: Secondary | ICD-10-CM | POA: Diagnosis not present

## 2019-03-23 DIAGNOSIS — I495 Sick sinus syndrome: Secondary | ICD-10-CM | POA: Diagnosis not present

## 2019-03-23 MED ORDER — ACETAMINOPHEN 325 MG PO TABS: 650.0000 mg | ORAL_TABLET | ORAL | Status: DC | PRN

## 2019-03-23 MED ORDER — PANTOPRAZOLE SODIUM 40 MG PO TBEC: 40.0000 mg | DELAYED_RELEASE_TABLET | Freq: Every day | ORAL | 0 refills | Status: DC

## 2019-03-23 NOTE — Discharge Instructions (Signed)

## 2019-03-28 ENCOUNTER — Encounter (HOSPITAL_COMMUNITY): Payer: Self-pay

## 2019-04-06 ENCOUNTER — Other Ambulatory Visit: Payer: Self-pay | Admitting: Cardiology

## 2019-04-06 MED ORDER — APIXABAN 5 MG PO TABS: 5.0000 mg | ORAL_TABLET | Freq: Two times a day (BID) | ORAL | 0 refills | Status: DC

## 2019-04-06 MED ORDER — SOTALOL HCL 80 MG PO TABS: 80.0000 mg | ORAL_TABLET | Freq: Two times a day (BID) | ORAL | 0 refills | Status: DC

## 2019-04-06 NOTE — Telephone Encounter (Signed)
°*  STAT* If patient is at the pharmacy, call can be transferred to refill team.   1. Which medications need to be refilled? (please list name of each medication and dose if known) Sotalol 80mg , and Eliquis 5mg   2. Which pharmacy/location (including street and city if local pharmacy) is medication to be sent to? [facility] on [address]  3. Do they need a 30 day or 90 day supply? [place]

## 2019-04-06 NOTE — Telephone Encounter (Signed)
Rx for sotalol and Eliquis sent to pharmacy.

## 2019-04-06 NOTE — Telephone Encounter (Signed)
This is Dr. Munley's pt 

## 2019-04-08 ENCOUNTER — Encounter (HOSPITAL_COMMUNITY): Payer: Self-pay

## 2019-04-14 MED ORDER — APIXABAN 5 MG PO TABS: 5.0000 mg | ORAL_TABLET | Freq: Two times a day (BID) | ORAL | 2 refills | Status: DC

## 2019-04-19 ENCOUNTER — Encounter (HOSPITAL_COMMUNITY): Payer: Self-pay | Admitting: Physician Assistant

## 2019-04-19 ENCOUNTER — Ambulatory Visit (HOSPITAL_COMMUNITY)
Admission: RE | Admit: 2019-04-19 | Discharge: 2019-04-19 | Disposition: A | Discharge status: Home / Self Care | Payer: Medicare Other | Source: Ambulatory Visit | Attending: Nurse Practitioner | Admitting: Nurse Practitioner

## 2019-04-19 ENCOUNTER — Other Ambulatory Visit: Payer: Self-pay

## 2019-04-19 VITALS — BP 164/82 | HR 60 | Ht 65.0 in | Wt 188.0 lb

## 2019-04-19 DIAGNOSIS — I480 Paroxysmal atrial fibrillation: Secondary | ICD-10-CM

## 2019-04-19 DIAGNOSIS — I10 Essential (primary) hypertension: Secondary | ICD-10-CM | POA: Insufficient documentation

## 2019-04-19 DIAGNOSIS — Z79899 Other long term (current) drug therapy: Secondary | ICD-10-CM | POA: Diagnosis not present

## 2019-04-19 DIAGNOSIS — I4892 Unspecified atrial flutter: Secondary | ICD-10-CM | POA: Insufficient documentation

## 2019-04-19 DIAGNOSIS — Z6831 Body mass index (BMI) 31.0-31.9, adult: Secondary | ICD-10-CM | POA: Diagnosis not present

## 2019-04-19 DIAGNOSIS — E669 Obesity, unspecified: Secondary | ICD-10-CM | POA: Diagnosis not present

## 2019-04-19 DIAGNOSIS — Z950 Presence of cardiac pacemaker: Secondary | ICD-10-CM | POA: Insufficient documentation

## 2019-04-19 DIAGNOSIS — E785 Hyperlipidemia, unspecified: Secondary | ICD-10-CM | POA: Diagnosis not present

## 2019-04-19 DIAGNOSIS — Z951 Presence of aortocoronary bypass graft: Secondary | ICD-10-CM | POA: Insufficient documentation

## 2019-04-19 DIAGNOSIS — D6869 Other thrombophilia: Secondary | ICD-10-CM

## 2019-04-19 DIAGNOSIS — Z7982 Long term (current) use of aspirin: Secondary | ICD-10-CM | POA: Insufficient documentation

## 2019-04-19 DIAGNOSIS — Z8249 Family history of ischemic heart disease and other diseases of the circulatory system: Secondary | ICD-10-CM | POA: Insufficient documentation

## 2019-04-19 DIAGNOSIS — I495 Sick sinus syndrome: Secondary | ICD-10-CM | POA: Insufficient documentation

## 2019-04-19 DIAGNOSIS — Z7901 Long term (current) use of anticoagulants: Secondary | ICD-10-CM | POA: Insufficient documentation

## 2019-04-19 DIAGNOSIS — I2510 Atherosclerotic heart disease of native coronary artery without angina pectoris: Secondary | ICD-10-CM | POA: Insufficient documentation

## 2019-04-19 DIAGNOSIS — I701 Atherosclerosis of renal artery: Secondary | ICD-10-CM | POA: Insufficient documentation

## 2019-04-19 DIAGNOSIS — Z87891 Personal history of nicotine dependence: Secondary | ICD-10-CM | POA: Diagnosis not present

## 2019-04-19 HISTORY — DX: Other thrombophilia: D68.69

## 2019-04-19 LAB — BASIC METABOLIC PANEL
Anion gap: 12 (ref 5–15)
BUN: 43 mg/dL — ABNORMAL HIGH (ref 8–23)
CO2: 25 mmol/L (ref 22–32)
Calcium: 10.2 mg/dL (ref 8.9–10.3)
Chloride: 95 mmol/L — ABNORMAL LOW (ref 98–111)
Creatinine, Ser: 1.88 mg/dL — ABNORMAL HIGH (ref 0.44–1.00)
GFR calc Af Amer: 31 mL/min — ABNORMAL LOW (ref >60)
GFR calc non Af Amer: 27 mL/min — ABNORMAL LOW (ref >60)
Glucose, Bld: 129 mg/dL — ABNORMAL HIGH (ref 70–99)
Potassium: 4.6 mmol/L (ref 3.5–5.1)
Sodium: 132 mmol/L — ABNORMAL LOW (ref 135–145)

## 2019-04-19 LAB — MAGNESIUM: Magnesium: 2.7 mg/dL — ABNORMAL HIGH (ref 1.7–2.4)

## 2019-04-19 NOTE — Progress Notes (Signed)
Primary Care Physician: Forrest Moron, MD Primary Cardiologist: Dr Dulce Sellar Primary Electrophysiologist: Dr Elberta Fortis Referring Physician: Dr Lenice Pressman is a 68 y.o. female with a history of SSS s/p PPM, CAD s/p CABG, HTN, renal artery stenosis, HLD, and paroxysmal atrial fibrillation who presents for follow up in the [facility]. Patient has a a previous cryoablation in 2019 and has been maintained on sotalol. She had more episodes of afib with symptoms of weakness, fatigue, and SOB and underwent repeat ablation with Dr Elberta Fortis on 03/22/19. She is on Eliquis for a CHADS2VASC score of 4. Patient reports that she has done very well since her ablation. She has had no heart racing or palpitations. She also denies CP, swallowing or groin issues.   Today, she denies symptoms of palpitations, chest pain, shortness of breath, orthopnea, PND, lower extremity edema, dizziness, presyncope, syncope, snoring, daytime somnolence, bleeding, or neurologic sequela. The patient is tolerating medications without difficulties and is otherwise without complaint today.    Atrial Fibrillation Risk Factors:  she does not have symptoms or diagnosis of sleep apnea. she does not have a history of rheumatic fever.  she has a BMI of Body mass index is 31.28 kg/m.Marland Kitchen Filed Weights   04/19/19 1348  Weight: 85.3 kg    Family History  Problem Relation Age of Onset  . Hypertension Mother   . Heart attack Father 1  . Hypertension Sister   . Hypertension Sister   . Hypertension Daughter   . Hypertension Son      Atrial Fibrillation Management history:  Previous antiarrhythmic drugs: sotalol  Previous cardioversions: remotely Previous ablations: cryoablation 2019, 03/22/19 (fib and flutter) CHADS2VASC score: 4 Anticoagulation history: Eliquis   Past Medical History:  Diagnosis Date  . CAD in native artery 01/02/2019  . Carotid stenosis, bilateral 01/02/2019  .  Essential hypertension 01/02/2019  . History of amiodarone therapy 01/02/2019  . Hx of CABG 01/02/2019  . Hyperlipidemia 01/02/2019  . PAF (paroxysmal atrial fibrillation) (HCC) 01/02/2019  . Renal artery stenosis (HCC) 01/02/2019  . Sick sinus syndrome (HCC) 01/02/2019   Past Surgical History:  Procedure Laterality Date  . ATRIAL FIBRILLATION ABLATION N/A 03/22/2019   Procedure: ATRIAL FIBRILLATION ABLATION;  Surgeon: Regan Lemming, MD;  Location: [facility] IN[facility] CV [facility];  Service: Cardiovascular;  Laterality: N/A;  . BOWEL RESECTION    . CARDIOVERSION    . CORONARY ANGIOPLASTY WITH STENT PLACEMENT    . CORONARY ARTERY BYPASS GRAFT  2016   x 3 vessels  . CRYOABLATION    . INSERT / REPLACE / REMOVE PACEMAKER      Current Outpatient Medications  Medication Sig Dispense Refill  . acetaminophen (TYLENOL) 325 MG tablet Take 2 tablets (650 mg total) by mouth every 4 (four) hours as needed for headache or mild pain.    Marland Kitchen albuterol (VENTOLIN HFA) 108 (90 Base) MCG/ACT inhaler Inhale 2 puffs into the lungs every 6 (six) hours as needed for wheezing or shortness of breath.     . allopurinol (ZYLOPRIM) 100 MG tablet Take 100 mg by mouth daily.    Marland Kitchen ALPRAZolam (XANAX) 0.25 MG tablet Take 0.25 mg by mouth at bedtime as needed for anxiety.    Marland Kitchen apixaban (ELIQUIS) 5 MG TABS tablet Take 1 tablet (5 mg total) by mouth 2 (two) times daily. 180 tablet 2  . aspirin EC 81 MG tablet Take 1 tablet by mouth daily.    Marland Kitchen atorvastatin (LIPITOR) 40 MG tablet  TAKE 1 TABLET EVERY DAY    . carvedilol (COREG) 12.5 MG tablet Take 1 tablet (12.5 mg total) by mouth 2 (two) times daily. 60 tablet 3  . cloNIDine (CATAPRES) 0.1 MG tablet Take 0.1 mg by mouth 2 (two) times daily.    . furosemide (LASIX) 40 MG tablet Take 1 tablet in the morning and 0.5 tablet in the evening    . levothyroxine (SYNTHROID) 75 MCG tablet Take 75 mcg by mouth daily before breakfast.     . losartan (COZAAR) 100 MG tablet Take 100 mg by mouth  daily.    . nitroGLYCERIN (NITROSTAT) 0.4 MG SL tablet Place 0.4 mg under the tongue every 5 (five) minutes x 3 doses as needed.    . pantoprazole (PROTONIX) 40 MG tablet Take 1 tablet (40 mg total) by mouth daily. For 6 weeks s/p ablation 56 tablet 0  . potassium chloride (MICRO-K) 10 MEQ CR capsule Take 10 mEq by mouth daily.    . pramipexole (MIRAPEX) 0.25 MG tablet Take 0.25 mg by mouth every evening.     . sotalol (BETAPACE) 80 MG tablet Take 1 tablet (80 mg total) by mouth 2 (two) times daily. 60 tablet 0  . vitamin B-12 (CYANOCOBALAMIN) 1000 MCG tablet Take 1,000 mcg by mouth daily.     No current facility-administered medications for this encounter.     Allergies  Allergen Reactions  . Tizanidine Itching and Other (See Comments)    "didnt like the way it made her feel" -- weakness "didnt like the way it made her feel" -- weakness   . Ace Inhibitors Other (See Comments)    unknown Cannot recall   . Hydrocodone-Homatropine Nausea And Vomiting  . Rofecoxib Nausea Only    MADE ME FEEL WEIRD     Social History   Socioeconomic History  . Marital status: Married    Spouse name: Not on file  . Number of children: Not on file  . Years of education: Not on file  . Highest education level: Not on file  Occupational History  . Not on file  Social Needs  . Financial resource strain: Not on file  . Food insecurity    Worry: Not on file    Inability: Not on file  . Transportation needs    Medical: Not on file    Non-medical: Not on file  Tobacco Use  . Smoking status: Former Smoker    Packs/day: 0.75    Years: 45.00    Pack years: 33.75    Types: Cigarettes    Quit date: 2009    Years since quitting: 11.8  . Smokeless tobacco: Never Used  Substance and Sexual Activity  . Alcohol use: Yes    Alcohol/week: 2.0 standard drinks    Types: 2 Cans of beer per week    Comment: 2 beers daily  . Drug use: Never  . Sexual activity: Not on file  Lifestyle  . Physical  activity    Days per week: Not on file    Minutes per session: Not on file  . Stress: Not on file  Relationships  . Social Herbalist on phone: Not on file    Gets together: Not on file    Attends religious service: Not on file    Active member of club or organization: Not on file    Attends meetings of clubs or organizations: Not on file    Relationship status: Not on file  . Intimate partner  violence    Fear of current or ex partner: Not on file    Emotionally abused: Not on file    Physically abused: Not on file    Forced sexual activity: Not on file  Other Topics Concern  . Not on file  Social History Narrative  . Not on file     ROS- All systems are reviewed and negative except as per the HPI above.  Physical Exam: Vitals:   04/19/19 1348  BP: (!) 164/82  Pulse: 60  Weight: 85.3 kg  Height: 5\' 5"  (1.651 m)    GEN- The patient is well appearing obese female, alert and oriented x 3 today.   Head- normocephalic, atraumatic Eyes-  Sclera clear, conjunctiva pink Ears- hearing intact Oropharynx- clear Neck- supple  Lungs- Clear to ausculation bilaterally, normal work of breathing Heart- Regular rate and rhythm, no murmurs, rubs or gallops  GI- soft, NT, ND, + BS Extremities- no clubbing, cyanosis, or edema MS- no significant deformity or atrophy Skin- no rash or lesion Psych- euthymic mood, full affect Neuro- strength and sensation are intact  Wt Readings from Last 3 Encounters:  04/19/19 85.3 kg  03/23/19 88.3 kg  03/15/19 87.5 kg    EKG today demonstrates AV dual paced rhythm HR 60, PR 222, QRS 178, QTc 522  Echo 01/10/19 demonstrated   1. Mild hypokinesis of the left ventricular, basal-mid inferoseptal wall.  2. The left ventricle has low normal systolic function, with an ejection fraction of 50-55%. The cavity size was normal. Left ventricular diastolic Doppler parameters are consistent with pseudonormalization.  3. The right ventricle has  normal systolic function. The cavity was normal. There is no increase in right ventricular wall thickness.  4. Left atrial size was mildly dilated.  5. Mild thickening of the aortic valve. Mild calcification of the aortic valve. Aortic valve regurgitation was not assessed by color flow Doppler.  6. The aorta is normal in size and structure.   Epic records are reviewed at length today  Assessment and Plan:  1. Paroxysmal atrial fibrillation/typical atrial flutter S/p cryoablation 2019 and repeat ablation 03/22/19 with Dr Elberta Fortisamnitz. Patient appears to be maintaining SR. Continue Eliquis 5 mg BID with no missed doses for at least 3 months post ablation. Continue sotalol 80 mg BID. QT stable. Continue Coreg 12.5 mg BID Check Bmet/mag today.  This patients CHA2DS2-VASc Score and unadjusted Ischemic Stroke Rate (% per year) is equal to 4.8 % stroke rate/year from a score of 4  Above score calculated as 1 point each if present [CHF, HTN, DM, Vascular=MI/PAD/Aortic Plaque, Age if 65-74, or Female] Above score calculated as 2 points each if present [Age > 75, or Stroke/TIA/TE]   2. Obesity Body mass index is 31.28 kg/m. Lifestyle modification was discussed at length including regular exercise and weight reduction.  3. CAD S/p CABG. No anginal symptoms. Followed by Dr Dulce SellarMunley.   4. HTN Elevated today but patient reports excellent control at home. ? White coat. Will not make any changes today.  5. Sick sinus syndrome S/p PPM, followed by Dr Elberta Fortisamnitz and the device clinic.   Follow up with Dr Elberta Fortisamnitz as scheduled.   Jorja Loaicky Jaisen Wiltrout PA-C [facility] [address] [place], [place] [address] [phone] 04/19/2019 2:10 PM

## 2019-04-20 LAB — CUP PACEART INCLINIC DEVICE CHECK
Battery Remaining Longevity: 103 mo
Battery Voltage: 3.01 V
Brady Statistic RA Percent Paced: 68 %
Brady Statistic RV Percent Paced: 81 %
Date Time Interrogation Session: 20200825194119
Implantable Lead Implant Date: 20190909
Implantable Lead Implant Date: 20190909
Implantable Lead Location: 753859
Implantable Lead Location: 753860
Implantable Pulse Generator Implant Date: 20190909
Lead Channel Impedance Value: 512.5 Ohm
Lead Channel Impedance Value: 575 Ohm
Lead Channel Pacing Threshold Amplitude: 1 V
Lead Channel Pacing Threshold Pulse Width: 0.4 ms
Lead Channel Sensing Intrinsic Amplitude: 12 mV
Lead Channel Sensing Intrinsic Amplitude: 2.4 mV
Lead Channel Setting Pacing Amplitude: 2 V
Lead Channel Setting Pacing Amplitude: 2.5 V
Lead Channel Setting Pacing Pulse Width: 0.4 ms
Lead Channel Setting Sensing Sensitivity: 2 mV
Pulse Gen Model: 2272
Pulse Gen Serial Number: 9053085

## 2019-04-25 ENCOUNTER — Other Ambulatory Visit: Payer: Self-pay | Admitting: Cardiology

## 2019-04-26 ENCOUNTER — Other Ambulatory Visit: Payer: Self-pay

## 2019-06-08 ENCOUNTER — Ambulatory Visit (INDEPENDENT_AMBULATORY_CARE_PROVIDER_SITE_OTHER): Payer: Medicare Other | Admitting: *Deleted

## 2019-06-08 DIAGNOSIS — I495 Sick sinus syndrome: Secondary | ICD-10-CM

## 2019-06-09 LAB — CUP PACEART REMOTE DEVICE CHECK
Battery Remaining Longevity: 120 mo
Battery Remaining Percentage: 95.5 %
Battery Voltage: 3.01 V
Brady Statistic AP VP Percent: 89 %
Brady Statistic AP VS Percent: 5.4 %
Brady Statistic AS VP Percent: 1.3 %
Brady Statistic AS VS Percent: 3.2 %
Brady Statistic RA Percent Paced: 61 %
Brady Statistic RV Percent Paced: 77 %
Date Time Interrogation Session: 20201223020014
Implantable Lead Implant Date: 20190909
Implantable Lead Implant Date: 20190909
Implantable Lead Location: 753859
Implantable Lead Location: 753860
Implantable Pulse Generator Implant Date: 20190909
Lead Channel Impedance Value: 440 Ohm
Lead Channel Impedance Value: 550 Ohm
Lead Channel Pacing Threshold Amplitude: 0.625 V
Lead Channel Pacing Threshold Amplitude: 1 V
Lead Channel Pacing Threshold Pulse Width: 0.4 ms
Lead Channel Pacing Threshold Pulse Width: 0.4 ms
Lead Channel Sensing Intrinsic Amplitude: 12 mV
Lead Channel Sensing Intrinsic Amplitude: 2.8 mV
Lead Channel Setting Pacing Amplitude: 1.25 V
Lead Channel Setting Pacing Amplitude: 1.625
Lead Channel Setting Pacing Pulse Width: 0.4 ms
Lead Channel Setting Sensing Sensitivity: 2 mV
Pulse Gen Model: 2272
Pulse Gen Serial Number: 9053085

## 2019-06-20 ENCOUNTER — Encounter: Payer: Medicare Other | Admitting: Cardiology

## 2019-06-20 ENCOUNTER — Ambulatory Visit: Payer: Medicare Other | Admitting: Cardiology

## 2019-06-27 ENCOUNTER — Encounter: Payer: Medicare Other | Admitting: Cardiology

## 2019-07-03 NOTE — Progress Notes (Signed)
Electrophysiology Office Note   Date:  07/04/2019   ID:  Diane Valentine, DOB 01/12/1951, MRN [id number]  PCP:  Forrest Moron, MD  Cardiologist:  Dulce Sellar Primary Electrophysiologist:  Araly Kaas Jorja Loa, MD    No chief complaint on file.    History of Present Illness: Diane Valentine is a 69 y.o. female who is being seen today for the evaluation of pacemaker at the request of Forrest Moron, MD. Presenting today for electrophysiology evaluation.  She has a history significant for paroxysmal atrial fibrillation, sick sinus syndrome status post Jude pacemaker, coronary artery disease status post CABG, hypertension, renal artery stenosis, hyperlipidemia.  She has had a cryoablation in 2019 for atrial fibrillation and is currently on sotalol.  Her main symptoms are weakness, fatigue, and mild shortness of breath.  She also has palpitations at times.  She feels that she has been out of rhythm for quite some time.  She is now status post repeat AF ablation 03/22/2019.  Today, denies symptoms of palpitations, chest pain, shortness of breath, orthopnea, PND, lower extremity edema, claudication, dizziness, presyncope, syncope, bleeding, or neurologic sequela. The patient is tolerating medications without difficulties.  Overall she is doing well.  She has had no further episodes of atrial fibrillation since her ablation.   Past Medical History:  Diagnosis Date  . CAD in native artery 01/02/2019  . Carotid stenosis, bilateral 01/02/2019  . Essential hypertension 01/02/2019  . History of amiodarone therapy 01/02/2019  . Hx of CABG 01/02/2019  . Hyperlipidemia 01/02/2019  . PAF (paroxysmal atrial fibrillation) (HCC) 01/02/2019  . Renal artery stenosis (HCC) 01/02/2019  . Sick sinus syndrome (HCC) 01/02/2019   Past Surgical History:  Procedure Laterality Date  . ATRIAL FIBRILLATION ABLATION N/A 03/22/2019   Procedure: ATRIAL FIBRILLATION ABLATION;  Surgeon: Regan Lemming, MD;  Location: [facility]  INVASIVE CV LAB;  Service: Cardiovascular;  Laterality: N/A;  . BOWEL RESECTION    . CARDIOVERSION    . CORONARY ANGIOPLASTY WITH STENT PLACEMENT    . CORONARY ARTERY BYPASS GRAFT  2016   x 3 vessels  . CRYOABLATION    . INSERT / REPLACE / REMOVE PACEMAKER       Current Outpatient Medications  Medication Sig Dispense Refill  . acetaminophen (TYLENOL) 325 MG tablet Take 2 tablets (650 mg total) by mouth every 4 (four) hours as needed for headache or mild pain.    Marland Kitchen albuterol (VENTOLIN HFA) 108 (90 Base) MCG/ACT inhaler Inhale 2 puffs into the lungs every 6 (six) hours as needed for wheezing or shortness of breath.     . allopurinol (ZYLOPRIM) 100 MG tablet Take 100 mg by mouth daily.    Marland Kitchen ALPRAZolam (XANAX) 0.25 MG tablet Take 0.25 mg by mouth at bedtime as needed for anxiety.    Marland Kitchen apixaban (ELIQUIS) 5 MG TABS tablet Take 1 tablet (5 mg total) by mouth 2 (two) times daily. 180 tablet 2  . aspirin EC 81 MG tablet Take 1 tablet by mouth daily.    Marland Kitchen atorvastatin (LIPITOR) 40 MG tablet TAKE 1 TABLET EVERY DAY    . carvedilol (COREG) 12.5 MG tablet Take 1 tablet (12.5 mg total) by mouth 2 (two) times daily. 180 tablet 1  . cloNIDine (CATAPRES) 0.1 MG tablet Take 0.1 mg by mouth 2 (two) times daily.    . furosemide (LASIX) 40 MG tablet Take 1 tablet in the morning and 0.5 tablet in the evening    . levothyroxine (SYNTHROID) 75 MCG tablet Take 75 mcg  by mouth daily before breakfast.     . losartan (COZAAR) 100 MG tablet Take 100 mg by mouth daily.    . nitroGLYCERIN (NITROSTAT) 0.4 MG SL tablet Place 0.4 mg under the tongue every 5 (five) minutes x 3 doses as needed.    . potassium chloride (MICRO-K) 10 MEQ CR capsule Take 10 mEq by mouth daily.    . pramipexole (MIRAPEX) 0.25 MG tablet Take 0.25 mg by mouth every evening.     . sotalol (BETAPACE) 80 MG tablet Take 1 tablet (80 mg total) by mouth 2 (two) times daily. 60 tablet 0  . vitamin B-12 (CYANOCOBALAMIN) 1000 MCG tablet Take 1,000 mcg by  mouth daily.     No current facility-administered medications for this visit.    Allergies:   Tizanidine, Ace inhibitors, Hydrocodone-homatropine, and Rofecoxib   Social History:  The patient  reports that she quit smoking about 12 years ago. Her smoking use included cigarettes. She has a 33.75 pack-year smoking history. She has never used smokeless tobacco. She reports current alcohol use of about 2.0 standard drinks of alcohol per week. She reports that she does not use drugs.   Family History:  The patient's family history includes Heart attack (age of onset: 21) in her father; Hypertension in her daughter, mother, sister, sister, and son.    ROS:  Please see the history of present illness.   Otherwise, review of systems is positive for none.   All other systems are reviewed and negative.   PHYSICAL EXAM: VS:  BP (!) 152/86   Pulse 63   Ht 5\' 5"  (1.651 m)   Wt 187 lb (84.8 kg)   SpO2 93%   BMI 31.12 kg/m  , BMI Body mass index is 31.12 kg/m. GEN: Well nourished, well developed, in no acute distress  HEENT: normal  Neck: no JVD, carotid bruits, or masses Cardiac: RRR; no murmurs, rubs, or gallops,no edema  Respiratory:  clear to auscultation bilaterally, normal work of breathing GI: soft, nontender, nondistended, + BS MS: no deformity or atrophy  Skin: warm and dry, device site well healed Neuro:  Strength and sensation are intact Psych: euthymic mood, full affect  EKG:  EKG is ordered today. Personal review of the ekg ordered shows sinus rhythm, rate 63, incomplete right bundle branch block, diffuse T wave inversions (old)  Personal review of the device interrogation today. Results in [facility]    Recent Labs: 01/03/2019: ALT 19; NT-Pro BNP 3,178; TSH 1.590 03/08/2019: Hemoglobin 13.9; Platelets 159 04/19/2019: BUN 43; Creatinine, Ser 1.88; Magnesium 2.7; Potassium 4.6; Sodium 132    Lipid Panel     Component Value Date/Time   CHOL 167 01/03/2019 1713   TRIG 133  01/03/2019 1713   HDL 61 01/03/2019 1713   CHOLHDL 2.7 01/03/2019 1713   LDLCALC 79 01/03/2019 1713     Wt Readings from Last 3 Encounters:  07/04/19 187 lb (84.8 kg)  04/19/19 188 lb (85.3 kg)  03/23/19 194 lb 11.2 oz (88.3 kg)      Other studies Reviewed: Additional studies/ records that were reviewed today include: TTE 01/10/19  Review of the above records today demonstrates:   1. Mild hypokinesis of the left ventricular, basal-mid inferoseptal wall.  2. The left ventricle has low normal systolic function, with an ejection fraction of 50-55%. The cavity size was normal. Left ventricular diastolic Doppler parameters are consistent with pseudonormalization.  3. The right ventricle has normal systolic function. The cavity was normal. There is no  increase in right ventricular wall thickness.  4. Left atrial size was mildly dilated.  5. Mild thickening of the aortic valve. Mild calcification of the aortic valve. Aortic valve regurgitation was not assessed by color flow Doppler.  6. The aorta is normal in size and structure.   ASSESSMENT AND PLAN:  1.  Sick sinus syndrome: Status post Saint Jude dual-chamber pacemaker.  Device functioning appropriately.  No changes at this time.  2.  Atrial fibrillation, paroxysmal: Status post cryoablation.  Currently on sotalol.  CHA2DS2-VASc of 4.  Had repeat ablation 03/22/2019.  Remains in sinus rhythm.  No changes.  This patients CHA2DS2-VASc Score and unadjusted Ischemic Stroke Rate (% per year) is equal to 4.8 % stroke rate/year from a score of 4  Above score calculated as 1 point each if present [CHF, HTN, DM, Vascular=MI/PAD/Aortic Plaque, Age if 65-74, or Female] Above score calculated as 2 points each if present [Age > 75, or Stroke/TIA/TE]  3.  Hypertension: Blood pressure is elevated today with controlled.  No changes.  4.  Hyperlipidemia: Continue statin per primary cardiology   Current medicines are reviewed at length with the  patient today.   The patient does not have concerns regarding her medicines.  The following changes were made today: None  Labs/ tests ordered today include:  Orders Placed This Encounter  Procedures  . EKG 12-Lead    Disposition:   FU with Kayo Zion 6 months  Signed, Dorla Guizar Meredith Leeds, MD  07/04/2019 3:32 PM     [place] [address] [phone] (office) [phone] (fax)

## 2019-07-04 ENCOUNTER — Other Ambulatory Visit: Payer: Self-pay

## 2019-07-04 ENCOUNTER — Encounter: Payer: Self-pay | Admitting: Cardiology

## 2019-07-04 ENCOUNTER — Ambulatory Visit (INDEPENDENT_AMBULATORY_CARE_PROVIDER_SITE_OTHER): Payer: Medicare Other | Admitting: Cardiology

## 2019-07-04 VITALS — BP 152/86 | HR 63 | Ht 65.0 in | Wt 187.0 lb

## 2019-07-04 DIAGNOSIS — I495 Sick sinus syndrome: Secondary | ICD-10-CM

## 2019-07-04 DIAGNOSIS — Z950 Presence of cardiac pacemaker: Secondary | ICD-10-CM

## 2019-07-04 DIAGNOSIS — I4819 Other persistent atrial fibrillation: Secondary | ICD-10-CM

## 2019-07-04 LAB — CUP PACEART INCLINIC DEVICE CHECK
Battery Remaining Longevity: 114 mo
Battery Voltage: 3.01 V
Brady Statistic RA Percent Paced: 67 %
Brady Statistic RV Percent Paced: 78 %
Date Time Interrogation Session: 20210118171411
Implantable Lead Implant Date: 20190909
Implantable Lead Implant Date: 20190909
Implantable Lead Location: 753859
Implantable Lead Location: 753860
Implantable Pulse Generator Implant Date: 20190909
Lead Channel Impedance Value: 450 Ohm
Lead Channel Impedance Value: 562.5 Ohm
Lead Channel Pacing Threshold Amplitude: 0.625 V
Lead Channel Pacing Threshold Amplitude: 1 V
Lead Channel Pacing Threshold Pulse Width: 0.4 ms
Lead Channel Pacing Threshold Pulse Width: 0.4 ms
Lead Channel Sensing Intrinsic Amplitude: 12 mV
Lead Channel Sensing Intrinsic Amplitude: 2.8 mV
Lead Channel Setting Pacing Amplitude: 1.25 V
Lead Channel Setting Pacing Amplitude: 1.625
Lead Channel Setting Pacing Pulse Width: 0.4 ms
Lead Channel Setting Sensing Sensitivity: 2 mV
Pulse Gen Model: 2272
Pulse Gen Serial Number: 9053085

## 2019-07-18 ENCOUNTER — Ambulatory Visit (INDEPENDENT_AMBULATORY_CARE_PROVIDER_SITE_OTHER): Payer: Medicare Other | Admitting: Cardiology

## 2019-07-18 ENCOUNTER — Encounter: Payer: Self-pay | Admitting: Cardiology

## 2019-07-18 ENCOUNTER — Other Ambulatory Visit: Payer: Self-pay

## 2019-07-18 VITALS — BP 136/88 | HR 63 | Ht 65.0 in | Wt 191.0 lb

## 2019-07-18 DIAGNOSIS — I2510 Atherosclerotic heart disease of native coronary artery without angina pectoris: Secondary | ICD-10-CM

## 2019-07-18 DIAGNOSIS — I480 Paroxysmal atrial fibrillation: Secondary | ICD-10-CM

## 2019-07-18 DIAGNOSIS — I10 Essential (primary) hypertension: Secondary | ICD-10-CM | POA: Diagnosis not present

## 2019-07-18 NOTE — Progress Notes (Signed)
Cardiology Office Note:    Date:  07/18/2019   ID:  Diane Valentine, DOB [birth year], MRN [id number]  PCP:  Forrest Moron, MD  Cardiologist:  Thomasene Ripple, DO  Electrophysiologist:  Regan Lemming, MD   Referring MD: Forrest Moron, MD   Chief Complaint  Patient presents with  . Follow-up    History of Present Illness:    Diane Valentine is a 69 y.o. female with a hx of coronary artery disease status post CABG, atrial fibrillation status post ablation and now on sotalol and Eliquis.  The patient also follows with the A. fib clinic.  The patient last saw Dr. Dulce Sellar in July 2020 at which time she was still continued to have symptoms of atrial fibrillation and she referred to the EP clinic.  She was stable from a coronary artery standpoint during that visit.  Today she offers no complaints at this time.  Denies any shortness of breath, nausea, vomiting, lightheadedness or dizziness.   Past Medical History:  Diagnosis Date  . CAD in native artery 01/02/2019  . Carotid stenosis, bilateral 01/02/2019  . Essential hypertension 01/02/2019  . History of amiodarone therapy 01/02/2019  . Hx of CABG 01/02/2019  . Hyperlipidemia 01/02/2019  . PAF (paroxysmal atrial fibrillation) (HCC) 01/02/2019  . Renal artery stenosis (HCC) 01/02/2019  . Sick sinus syndrome (HCC) 01/02/2019    Past Surgical History:  Procedure Laterality Date  . ATRIAL FIBRILLATION ABLATION N/A 03/22/2019   Procedure: ATRIAL FIBRILLATION ABLATION;  Surgeon: Regan Lemming, MD;  Location: [facility];  Service: Cardiovascular;  Laterality: N/A;  . BOWEL RESECTION    . CARDIOVERSION    . CORONARY ANGIOPLASTY WITH STENT PLACEMENT    . CORONARY ARTERY BYPASS GRAFT  2016   x 3 vessels  . CRYOABLATION    . INSERT / REPLACE / REMOVE PACEMAKER      Current Medications: Current Meds  Medication Sig  . acetaminophen (TYLENOL) 325 MG tablet Take 2 tablets (650 mg total) by mouth every 4 (four) hours as needed for  headache or mild pain.  Marland Kitchen albuterol (VENTOLIN HFA) 108 (90 Base) MCG/ACT inhaler Inhale 2 puffs into the lungs every 6 (six) hours as needed for wheezing or shortness of breath.   . allopurinol (ZYLOPRIM) 100 MG tablet Take 100 mg by mouth daily.  Marland Kitchen ALPRAZolam (XANAX) 0.25 MG tablet Take 0.25 mg by mouth at bedtime as needed for anxiety.  Marland Kitchen apixaban (ELIQUIS) 5 MG TABS tablet Take 1 tablet (5 mg total) by mouth 2 (two) times daily.  Marland Kitchen aspirin EC 81 MG tablet Take 1 tablet by mouth daily.  Marland Kitchen atorvastatin (LIPITOR) 40 MG tablet TAKE 1 TABLET EVERY DAY  . carvedilol (COREG) 12.5 MG tablet Take 1 tablet (12.5 mg total) by mouth 2 (two) times daily.  . cloNIDine (CATAPRES) 0.1 MG tablet Take 0.1 mg by mouth 2 (two) times daily.  . furosemide (LASIX) 40 MG tablet Take 1 tablet in the morning and 0.5 tablet in the evening  . levothyroxine (SYNTHROID) 75 MCG tablet Take 75 mcg by mouth daily before breakfast.   . losartan (COZAAR) 100 MG tablet Take 100 mg by mouth daily.  . nitroGLYCERIN (NITROSTAT) 0.4 MG SL tablet Place 0.4 mg under the tongue every 5 (five) minutes x 3 doses as needed.  . potassium chloride (MICRO-K) 10 MEQ CR capsule Take 10 mEq by mouth daily.  . pramipexole (MIRAPEX) 0.25 MG tablet Take 0.25 mg by mouth every evening.   Marland Kitchen  sotalol (BETAPACE) 80 MG tablet Take 1 tablet (80 mg total) by mouth 2 (two) times daily.  . vitamin B-12 (CYANOCOBALAMIN) 1000 MCG tablet Take 1,000 mcg by mouth daily.     Allergies:   Tizanidine, Ace inhibitors, Hydrocodone-homatropine, and Rofecoxib   Social History   Socioeconomic History  . Marital status: Married    Spouse name: Not on file  . Number of children: Not on file  . Years of education: Not on file  . Highest education level: Not on file  Occupational History  . Not on file  Tobacco Use  . Smoking status: Former Smoker    Packs/day: 0.75    Years: 45.00    Pack years: 33.75    Types: Cigarettes    Quit date: 2009    Years since  quitting: 12.0  . Smokeless tobacco: Never Used  Substance and Sexual Activity  . Alcohol use: Yes    Alcohol/week: 2.0 standard drinks    Types: 2 Cans of beer per week    Comment: 2 beers daily  . Drug use: Never  . Sexual activity: Not on file  Other Topics Concern  . Not on file  Social History Narrative  . Not on file   Social Determinants of Health   Financial Resource Strain:   . Difficulty of Paying Living Expenses: Not on file  Food Insecurity:   . Worried About Programme researcher, broadcasting/film/video in the Last Year: Not on file  . Ran Out of Food in the Last Year: Not on file  Transportation Needs:   . Lack of Transportation (Medical): Not on file  . Lack of Transportation (Non-Medical): Not on file  Physical Activity:   . Days of Exercise per Week: Not on file  . Minutes of Exercise per Session: Not on file  Stress:   . Feeling of Stress : Not on file  Social Connections:   . Frequency of Communication with Friends and Family: Not on file  . Frequency of Social Gatherings with Friends and Family: Not on file  . Attends Religious Services: Not on file  . Active Member of Clubs or Organizations: Not on file  . Attends Banker Meetings: Not on file  . Marital Status: Not on file     Family History: The patient's family history includes Heart attack (age of onset: 62) in her father; Hypertension in her daughter, mother, sister, sister, and son.  ROS:   Review of Systems  Constitution: Negative for decreased appetite, fever and weight gain.  HENT: Negative for congestion, ear discharge, hoarse voice and sore throat.   Eyes: Negative for discharge, redness, vision loss in right eye and visual halos.  Cardiovascular: Negative for chest pain, dyspnea on exertion, leg swelling, orthopnea and palpitations.  Respiratory: Negative for cough, hemoptysis, shortness of breath and snoring.   Endocrine: Negative for heat intolerance and polyphagia.  Hematologic/Lymphatic:  Negative for bleeding problem. Does not bruise/bleed easily.  Skin: Negative for flushing, nail changes, rash and suspicious lesions.  Musculoskeletal: Negative for arthritis, joint pain, muscle cramps, myalgias, neck pain and stiffness.  Gastrointestinal: Negative for abdominal pain, bowel incontinence, diarrhea and excessive appetite.  Genitourinary: Negative for decreased libido, genital sores and incomplete emptying.  Neurological: Negative for brief paralysis, focal weakness, headaches and loss of balance.  Psychiatric/Behavioral: Negative for altered mental status, depression and suicidal ideas.  Allergic/Immunologic: Negative for HIV exposure and persistent infections.    EKGs/Labs/Other Studies Reviewed:    The following studies were  reviewed today:   EKG: None today.  Recent Labs: 01/03/2019: ALT 19; NT-Pro BNP 3,178; TSH 1.590 03/08/2019: Hemoglobin 13.9; Platelets 159 04/19/2019: BUN 43; Creatinine, Ser 1.88; Magnesium 2.7; Potassium 4.6; Sodium 132  Recent Lipid Panel    Component Value Date/Time   CHOL 167 01/03/2019 1713   TRIG 133 01/03/2019 1713   HDL 61 01/03/2019 1713   CHOLHDL 2.7 01/03/2019 1713   LDLCALC 79 01/03/2019 1713    Physical Exam:    VS:  BP 136/88 (BP Location: Right Arm, Patient Position: Sitting, Cuff Size: Normal)   Pulse 63   Ht 5\' 5"  (1.651 m)   Wt 191 lb (86.6 kg)   SpO2 93%   BMI 31.78 kg/m     Wt Readings from Last 3 Encounters:  07/18/19 191 lb (86.6 kg)  07/04/19 187 lb (84.8 kg)  04/19/19 188 lb (85.3 kg)     GEN: Well nourished, well developed in no acute distress HEENT: Normal NECK: No JVD; No carotid bruits LYMPHATICS: No lymphadenopathy CARDIAC: S1S2 noted,RRR, no murmurs, rubs, gallops RESPIRATORY:  Clear to auscultation without rales, wheezing or rhonchi  ABDOMEN: Soft, non-tender, non-distended, +bowel sounds, no guarding. EXTREMITIES: No edema, No cyanosis, no clubbing MUSCULOSKELETAL:  No deformity  SKIN: Warm  and dry NEUROLOGIC:  Alert and oriented x 3, non-focal PSYCHIATRIC:  Normal affect, good insight  ASSESSMENT:    1. CAD in native artery   2. Essential hypertension   3. PAF (paroxysmal atrial fibrillation) ([place])    PLAN:    1.  She appears to be doing well from a cardiovascular standpoint.  She has not had any symptoms of atrial fibrillation as she does feel it when she is in A. fib.  We will continue her on her on her sotalol and Eliquis.  2.  Coronary artery disease-stable continue current medication regimen.  3.  Hypertension blood pressure is acceptable on her current regimen at this time.   The patient is in agreement with the above plan. The patient left the office in stable condition.  The patient will follow up in 6 months or sooner if needed   Medication Adjustments/Labs and Tests Ordered: Current medicines are reviewed at length with the patient today.  Concerns regarding medicines are outlined above.  No orders of the defined types were placed in this encounter.  No orders of the defined types were placed in this encounter.   Patient Instructions  Medication Instructions:  Your physician recommends that you continue on your current medications as directed. Please refer to the Current Medication list given to you today.  *If you need a refill on your cardiac medications before your next appointment, please call your pharmacy*  Lab Work: None If you have labs (blood work) drawn today and your tests are completely normal, you will receive your results only by: Marland Kitchen MyChart Message (if you have MyChart) OR . A paper copy in the mail If you have any lab test that is abnormal or we need to change your treatment, we will call you to review the results.  Testing/Procedures: None  Follow-Up: At [facility], you and your health needs are our priority.  As part of our continuing mission to provide you with exceptional heart care, we have created designated Provider Care  Teams.  These Care Teams include your primary Cardiologist (physician) and Advanced Practice Providers (APPs -  Physician Assistants and Nurse Practitioners) who all work together to provide you with the care you need, when you need  it.  Your next appointment:   6 month(s)  The format for your next appointment:   In Person  Provider:   Thomasene Ripple, DO  Other Instructions      Adopting a Healthy Lifestyle.  Know what a healthy weight is for you (roughly BMI <25) and aim to maintain this   Aim for 7+ servings of fruits and vegetables daily   65-80+ fluid ounces of water or unsweet tea for healthy kidneys   Limit to max 1 drink of alcohol per day; avoid smoking/tobacco   Limit animal fats in diet for cholesterol and heart health - choose grass fed whenever available   Avoid highly processed foods, and foods high in saturated/trans fats   Aim for low stress - take time to unwind and care for your mental health   Aim for 150 min of moderate intensity exercise weekly for heart health, and weights twice weekly for bone health   Aim for 7-9 hours of sleep daily   When it comes to diets, agreement about the perfect plan isnt easy to find, even among the experts. Experts at the [facility] [employer] developed an idea known as the Healthy Eating Plate. Just imagine a plate divided into logical, healthy portions.   The emphasis is on diet quality:   Load up on vegetables and fruits - one-half of your plate: Aim for color and variety, and remember that potatoes dont count.   Go for whole grains - one-quarter of your plate: Whole wheat, barley, wheat berries, quinoa, oats, brown rice, and foods made with them. If you want pasta, go with whole wheat pasta.   Protein power - one-quarter of your plate: Fish, chicken, beans, and nuts are all healthy, versatile protein sources. Limit red meat.   The diet, however, does go beyond the plate, offering a few other suggestions.     Use healthy plant oils, such as olive, canola, soy, corn, sunflower and peanut. Check the labels, and avoid partially hydrogenated oil, which have unhealthy trans fats.   If youre thirsty, drink water. Coffee and tea are good in moderation, but skip sugary drinks and limit milk and dairy products to one or two daily servings.   The type of carbohydrate in the diet is more important than the amount. Some sources of carbohydrates, such as vegetables, fruits, whole grains, and beans-are healthier than others.   Finally, stay active  Signed, Thomasene Ripple, DO  07/18/2019 2:42 PM    [facility]

## 2019-07-18 NOTE — Patient Instructions (Signed)
Medication Instructions:  Your physician recommends that you continue on your current medications as directed. Please refer to the Current Medication list given to you today.  *If you need a refill on your cardiac medications before your next appointment, please call your pharmacy*  Lab Work: None If you have labs (blood work) drawn today and your tests are completely normal, you will receive your results only by: . MyChart Message (if you have MyChart) OR . A paper copy in the mail If you have any lab test that is abnormal or we need to change your treatment, we will call you to review the results.  Testing/Procedures: None  Follow-Up: At [facility], you and your health needs are our priority.  As part of our continuing mission to provide you with exceptional heart care, we have created designated Provider Care Teams.  These Care Teams include your primary Cardiologist (physician) and Advanced Practice Providers (APPs -  Physician Assistants and Nurse Practitioners) who all work together to provide you with the care you need, when you need it.  Your next appointment:   6 month(s)  The format for your next appointment:   In Person  Provider:   Kardie Tobb, DO  Other Instructions   

## 2019-08-07 ENCOUNTER — Ambulatory Visit: Payer: Medicare Other | Attending: Internal Medicine

## 2019-08-07 DIAGNOSIS — Z23 Encounter for immunization: Secondary | ICD-10-CM | POA: Insufficient documentation

## 2019-08-07 NOTE — Progress Notes (Signed)
   Covid-19 Vaccination Clinic  Name:  Diane Valentine    MRN: [id number] DOB: [birth year]  08/07/2019  [name] was observed post Covid-19 immunization for 15 minutes without incidence. She was provided with Vaccine Information Sheet and instruction to access the V-Safe system.   [name] was instructed to call 911 with any severe reactions post vaccine: Marland Kitchen Difficulty breathing  . Swelling of your face and throat  . A fast heartbeat  . A bad rash all over your body  . Dizziness and weakness    Immunizations Administered    Name Date Dose VIS Date Route   Pfizer COVID-19 Vaccine 08/07/2019 12:11 PM 0.3 mL 05/27/2019 Intramuscular   Manufacturer: [employer], [employer]   Lot: [id number]   NDC: [address]-0

## 2019-08-31 ENCOUNTER — Ambulatory Visit: Payer: Medicare Other | Attending: Internal Medicine

## 2019-08-31 DIAGNOSIS — Z23 Encounter for immunization: Secondary | ICD-10-CM

## 2019-08-31 NOTE — Progress Notes (Signed)
   Covid-19 Vaccination Clinic  Name:  Diane Valentine    MRN: [id number] DOB: 04/18/1951  08/31/2019  Diane Valentine was observed post Covid-19 immunization for 15 minutes without incident. She was provided with Vaccine Information Sheet and instruction to access the V-Safe system.   Diane Valentine was instructed to call 911 with any severe reactions post vaccine: Marland Kitchen Difficulty breathing  . Swelling of face and throat  . A fast heartbeat  . A bad rash all over body  . Dizziness and weakness   Immunizations Administered    Name Date Dose VIS Date Route   Pfizer COVID-19 Vaccine 08/31/2019 12:57 PM 0.3 mL 05/27/2019 Intramuscular   Manufacturer: [employer], [employer]   Lot: [id number]   NDC: [address]-4

## 2019-09-07 ENCOUNTER — Ambulatory Visit (INDEPENDENT_AMBULATORY_CARE_PROVIDER_SITE_OTHER): Payer: Medicare Other | Admitting: *Deleted

## 2019-09-07 DIAGNOSIS — I495 Sick sinus syndrome: Secondary | ICD-10-CM | POA: Diagnosis not present

## 2019-09-07 LAB — CUP PACEART REMOTE DEVICE CHECK
Battery Remaining Longevity: 124 mo
Battery Remaining Percentage: 95.5 %
Battery Voltage: 3.01 V
Brady Statistic AP VP Percent: 88 %
Brady Statistic AP VS Percent: 2.1 %
Brady Statistic AS VP Percent: 2.7 %
Brady Statistic AS VS Percent: 7.2 %
Brady Statistic RA Percent Paced: 89 %
Brady Statistic RV Percent Paced: 90 %
Date Time Interrogation Session: 20210324020013
Implantable Lead Implant Date: 20190909
Implantable Lead Implant Date: 20190909
Implantable Lead Location: 753859
Implantable Lead Location: 753860
Implantable Pulse Generator Implant Date: 20190909
Lead Channel Impedance Value: 480 Ohm
Lead Channel Impedance Value: 560 Ohm
Lead Channel Pacing Threshold Amplitude: 1.125 V
Lead Channel Pacing Threshold Amplitude: 1.125 V
Lead Channel Pacing Threshold Pulse Width: 0.4 ms
Lead Channel Pacing Threshold Pulse Width: 0.4 ms
Lead Channel Sensing Intrinsic Amplitude: 12 mV
Lead Channel Sensing Intrinsic Amplitude: 2.6 mV
Lead Channel Setting Pacing Amplitude: 1.375
Lead Channel Setting Pacing Amplitude: 2.125
Lead Channel Setting Pacing Pulse Width: 0.4 ms
Lead Channel Setting Sensing Sensitivity: 2 mV
Pulse Gen Model: 2272
Pulse Gen Serial Number: 9053085

## 2019-09-08 NOTE — Progress Notes (Signed)
PPM Remote  

## 2019-09-26 ENCOUNTER — Telehealth: Payer: Self-pay

## 2019-09-26 MED ORDER — SOTALOL HCL 80 MG PO TABS: 80.0000 mg | ORAL_TABLET | Freq: Two times a day (BID) | ORAL | 0 refills | Status: DC

## 2019-09-26 MED ORDER — CARVEDILOL 12.5 MG PO TABS: 12.5000 mg | ORAL_TABLET | Freq: Two times a day (BID) | ORAL | 1 refills | Status: DC

## 2019-09-26 NOTE — Telephone Encounter (Signed)
Refills sent in per patient request in MyChart message.

## 2019-10-03 ENCOUNTER — Telehealth: Payer: Self-pay

## 2019-10-03 NOTE — Telephone Encounter (Signed)
Needs AF clinic follow up to discuss.

## 2019-10-03 NOTE — Telephone Encounter (Signed)
Called and left message for patient to call back to schedule f/u appt with Jorja Loa, PA.

## 2019-10-03 NOTE — Telephone Encounter (Signed)
Merlin alert received- Ongoing Afl episode.   Pt with history of AF ablation 2019 and repeat 03/2019.  Current meds include Sotalol 80mg  BID, Carvedilol 12.5mg  BID and Eliquis for OAC.  Pt has had recent AF burden increase over the last couple of days.    Spoke with pt.  She indicates she was not aware of being out of rhythm although she then noted that she has had some SOB at times.  Pt confirmed she is taking medications as ordered.  Pt also reports that she will be leaving this weekend for a week at the beach.    Advised would forward info to MD for review.

## 2019-10-03 NOTE — Telephone Encounter (Signed)
Left VM for pt advising of MD recommendation.  Will forward to AF clinic for them to contact pt to set up appt.  Provided device clinic phone # to return call if any questions.

## 2019-10-03 NOTE — Telephone Encounter (Signed)
Patient returned my call.  She is agreeable to appt 10/04/19 @11 :30 am with .

## 2019-10-04 ENCOUNTER — Encounter (HOSPITAL_COMMUNITY): Payer: Self-pay | Admitting: Physician Assistant

## 2019-10-04 ENCOUNTER — Ambulatory Visit (HOSPITAL_COMMUNITY)
Admission: RE | Admit: 2019-10-04 | Discharge: 2019-10-04 | Disposition: A | Discharge status: Home / Self Care | Payer: Medicare Other | Source: Ambulatory Visit | Attending: Physician Assistant | Admitting: Physician Assistant

## 2019-10-04 ENCOUNTER — Other Ambulatory Visit: Payer: Self-pay

## 2019-10-04 VITALS — BP 128/82 | HR 81 | Ht 65.0 in | Wt 189.6 lb

## 2019-10-04 DIAGNOSIS — I2510 Atherosclerotic heart disease of native coronary artery without angina pectoris: Secondary | ICD-10-CM | POA: Diagnosis not present

## 2019-10-04 DIAGNOSIS — Z951 Presence of aortocoronary bypass graft: Secondary | ICD-10-CM | POA: Diagnosis not present

## 2019-10-04 DIAGNOSIS — E785 Hyperlipidemia, unspecified: Secondary | ICD-10-CM | POA: Diagnosis not present

## 2019-10-04 DIAGNOSIS — Z8249 Family history of ischemic heart disease and other diseases of the circulatory system: Secondary | ICD-10-CM | POA: Diagnosis not present

## 2019-10-04 DIAGNOSIS — Z79899 Other long term (current) drug therapy: Secondary | ICD-10-CM | POA: Insufficient documentation

## 2019-10-04 DIAGNOSIS — I495 Sick sinus syndrome: Secondary | ICD-10-CM | POA: Insufficient documentation

## 2019-10-04 DIAGNOSIS — I480 Paroxysmal atrial fibrillation: Secondary | ICD-10-CM | POA: Diagnosis present

## 2019-10-04 DIAGNOSIS — Z6831 Body mass index (BMI) 31.0-31.9, adult: Secondary | ICD-10-CM | POA: Insufficient documentation

## 2019-10-04 DIAGNOSIS — Z87891 Personal history of nicotine dependence: Secondary | ICD-10-CM | POA: Insufficient documentation

## 2019-10-04 DIAGNOSIS — Z7982 Long term (current) use of aspirin: Secondary | ICD-10-CM | POA: Insufficient documentation

## 2019-10-04 DIAGNOSIS — I4892 Unspecified atrial flutter: Secondary | ICD-10-CM | POA: Insufficient documentation

## 2019-10-04 DIAGNOSIS — I701 Atherosclerosis of renal artery: Secondary | ICD-10-CM | POA: Insufficient documentation

## 2019-10-04 DIAGNOSIS — E669 Obesity, unspecified: Secondary | ICD-10-CM | POA: Diagnosis not present

## 2019-10-04 DIAGNOSIS — I10 Essential (primary) hypertension: Secondary | ICD-10-CM | POA: Insufficient documentation

## 2019-10-04 DIAGNOSIS — D6869 Other thrombophilia: Secondary | ICD-10-CM | POA: Diagnosis not present

## 2019-10-04 DIAGNOSIS — Z7901 Long term (current) use of anticoagulants: Secondary | ICD-10-CM | POA: Insufficient documentation

## 2019-10-04 NOTE — Progress Notes (Signed)
Primary Care Physician: Charleston Poot, MD Primary Cardiologist: Dr Bettina Gavia Primary Electrophysiologist: Dr Curt Bears Referring Physician: Dr Minus Liberty is a 69 y.o. female with a history of SSS s/p PPM, CAD s/p CABG, HTN, renal artery stenosis, HLD, and paroxysmal atrial fibrillation who presents for follow up in the [facility]. Patient has a a previous cryoablation in 2019 and has been maintained on sotalol. She had more episodes of afib with symptoms of weakness, fatigue, and SOB and underwent repeat ablation with Dr Curt Bears on 03/22/19. She is on Eliquis for a CHADS2VASC score of 4.   On follow up today, patient reports she has done well since her last visit. The device clinic received an alert for an ongoing afib episode since 10/02/19. Patient was unaware of her arrhythmia. She is rate controlled today. She denies any specific triggers that she could identify.   Today, she denies symptoms of palpitations, chest pain, shortness of breath, orthopnea, PND, lower extremity edema, dizziness, presyncope, syncope, snoring, daytime somnolence, bleeding, or neurologic sequela. The patient is tolerating medications without difficulties and is otherwise without complaint today.    Atrial Fibrillation Risk Factors:  she does not have symptoms or diagnosis of sleep apnea. she does not have a history of rheumatic fever.  she has a BMI of Body mass index is 31.55 kg/m.Marland Kitchen Filed Weights   10/04/19 1147  Weight: 86 kg    Family History  Problem Relation Age of Onset   Hypertension Mother    Heart attack Father 9   Hypertension Sister    Hypertension Sister    Hypertension Daughter    Hypertension Son      Atrial Fibrillation Management history:  Previous antiarrhythmic drugs: sotalol  Previous cardioversions: remotely Previous ablations: cryoablation 2019, 03/22/19 (fib and flutter) CHADS2VASC score: 4 Anticoagulation history:  Eliquis   Past Medical History:  Diagnosis Date   CAD in native artery 01/02/2019   Carotid stenosis, bilateral 01/02/2019   Essential hypertension 01/02/2019   History of amiodarone therapy 01/02/2019   Hx of CABG 01/02/2019   Hyperlipidemia 01/02/2019   PAF (paroxysmal atrial fibrillation) ([place]) 01/02/2019   Renal artery stenosis ([place]) 01/02/2019   Sick sinus syndrome ([place]) 01/02/2019   Past Surgical History:  Procedure Laterality Date   ATRIAL FIBRILLATION ABLATION N/A 03/22/2019   Procedure: ATRIAL FIBRILLATION ABLATION;  Surgeon: Constance Haw, MD;  Location: [place] [facility];  Service: Cardiovascular;  Laterality: N/A;   BOWEL RESECTION     CARDIOVERSION     CORONARY ANGIOPLASTY WITH STENT PLACEMENT     CORONARY ARTERY BYPASS GRAFT  2016   x 3 vessels   CRYOABLATION     INSERT / REPLACE / REMOVE PACEMAKER      Current Outpatient Medications  Medication Sig Dispense Refill   acetaminophen (TYLENOL) 325 MG tablet Take 2 tablets (650 mg total) by mouth every 4 (four) hours as needed for headache or mild pain.     albuterol (VENTOLIN HFA) 108 (90 Base) MCG/ACT inhaler Inhale 2 puffs into the lungs every 6 (six) hours as needed for wheezing or shortness of breath.      allopurinol (ZYLOPRIM) 100 MG tablet Take 100 mg by mouth daily.     ALPRAZolam (XANAX) 0.25 MG tablet Take 0.25 mg by mouth at bedtime as needed for anxiety.     apixaban (ELIQUIS) 5 MG TABS tablet Take 1 tablet (5 mg total) by mouth 2 (two) times daily. 180 tablet 2  aspirin EC 81 MG tablet Take 1 tablet by mouth daily.     atorvastatin (LIPITOR) 40 MG tablet TAKE 1 TABLET EVERY DAY     carvedilol (COREG) 12.5 MG tablet Take 1 tablet (12.5 mg total) by mouth 2 (two) times daily. 180 tablet 1   cloNIDine (CATAPRES) 0.1 MG tablet Take 0.1 mg by mouth 2 (two) times daily.     furosemide (LASIX) 40 MG tablet Take 1 tablet in the morning and 0.5 tablet in the evening     levothyroxine  (SYNTHROID) 75 MCG tablet Take 75 mcg by mouth daily before breakfast.      losartan (COZAAR) 100 MG tablet Take 100 mg by mouth daily.     Multiple Vitamins-Minerals (HAIR/SKIN/NAILS/BIOTIN PO) Take by mouth.     nitroGLYCERIN (NITROSTAT) 0.4 MG SL tablet Place 0.4 mg under the tongue every 5 (five) minutes x 3 doses as needed.     potassium chloride (MICRO-K) 10 MEQ CR capsule Take 10 mEq by mouth daily.     pramipexole (MIRAPEX) 0.25 MG tablet Take 0.25 mg by mouth every evening.      sotalol (BETAPACE) 80 MG tablet Take 1 tablet (80 mg total) by mouth 2 (two) times daily. 60 tablet 0   vitamin B-12 (CYANOCOBALAMIN) 1000 MCG tablet Take 1,000 mcg by mouth daily.     No current facility-administered medications for this encounter.    Allergies  Allergen Reactions   Tizanidine Itching and Other (See Comments)    "didnt like the way it made her feel" -- weakness "didnt like the way it made her feel" -- weakness    Ace Inhibitors Other (See Comments)    unknown Cannot recall    Hydrocodone-Homatropine Nausea And Vomiting   Rofecoxib Nausea Only    MADE ME FEEL WEIRD     Social History   Socioeconomic History   Marital status: Married    Spouse name: Not on file   Number of children: Not on file   Years of education: Not on file   Highest education level: Not on file  Occupational History   Not on file  Tobacco Use   Smoking status: Former Smoker    Packs/day: 0.75    Years: 45.00    Pack years: 33.75    Types: Cigarettes    Quit date: 2009    Years since quitting: 12.3   Smokeless tobacco: Never Used  Substance and Sexual Activity   Alcohol use: Yes    Alcohol/week: 2.0 standard drinks    Types: 2 Cans of beer per week    Comment: 2 beers daily   Drug use: Never   Sexual activity: Not on file  Other Topics Concern   Not on file  Social History Narrative   Not on file   Social Determinants of Health   Financial Resource Strain:     Difficulty of Paying Living Expenses:   Food Insecurity:    Worried About Programme researcher, broadcasting/film/video in the Last Year:    Barista in the Last Year:   Transportation Needs:    Freight forwarder (Medical):    Lack of Transportation (Non-Medical):   Physical Activity:    Days of Exercise per Week:    Minutes of Exercise per Session:   Stress:    Feeling of Stress :   Social Connections:    Frequency of Communication with Friends and Family:    Frequency of Social Gatherings with Friends and Family:  Attends Religious Services:    Active Member of Clubs or Organizations:    Attends Engineer, structural:    Marital Status:   Intimate Partner Violence:    Fear of Current or Ex-Partner:    Emotionally Abused:    Physically Abused:    Sexually Abused:      ROS- All systems are reviewed and negative except as per the HPI above.  Physical Exam: Vitals:   10/04/19 1147  BP: 128/82  Pulse: 81  Weight: 86 kg  Height: 5\' 5"  (1.651 m)    GEN- The patient is well appearing obese female, alert and oriented x 3 today.   HEENT-head normocephalic, atraumatic, sclera clear, conjunctiva pink, hearing intact, trachea midline. Lungs- Clear to ausculation bilaterally, normal work of breathing Heart- irregular rate and rhythm, no murmurs, rubs or gallops  GI- soft, NT, ND, + BS Extremities- no clubbing, cyanosis, or edema MS- no significant deformity or atrophy Skin- no rash or lesion Psych- euthymic mood, full affect Neuro- strength and sensation are intact   Wt Readings from Last 3 Encounters:  10/04/19 86 kg  07/18/19 86.6 kg  07/04/19 84.8 kg    EKG today demonstrates afib HR 81, NST (baseline), inc RBBB, QRS 104, QTc 466  Echo 01/10/19 demonstrated   1. Mild hypokinesis of the left ventricular, basal-mid inferoseptal wall.  2. The left ventricle has low normal systolic function, with an ejection fraction of 50-55%. The cavity size was normal.  Left ventricular diastolic Doppler parameters are consistent with pseudonormalization.  3. The right ventricle has normal systolic function. The cavity was normal. There is no increase in right ventricular wall thickness.  4. Left atrial size was mildly dilated.  5. Mild thickening of the aortic valve. Mild calcification of the aortic valve. Aortic valve regurgitation was not assessed by color flow Doppler.  6. The aorta is normal in size and structure.   Epic records are reviewed at length today  Assessment and Plan:  1. Paroxysmal atrial fibrillation/typical atrial flutter S/p cryoablation 2019 and repeat ablation 03/22/19 with Dr 05/22/19. We discussed therapeutic options today including DCCV. Given her paucity of symptoms, patient prefers to wait until after her beach trip. If she remains persistent on follow up, will consider DCCV. We also briefly discussed changing AAD if she continues to have recurrent afib. Continue Eliquis 5 mg BID  Continue sotalol 80 mg BID. QT stable. Continue Coreg 12.5 mg BID  This patients CHA2DS2-VASc Score and unadjusted Ischemic Stroke Rate (% per year) is equal to 4.8 % stroke rate/year from a score of 4  Above score calculated as 1 point each if present [CHF, HTN, DM, Vascular=MI/PAD/Aortic Plaque, Age if 65-74, or Female] Above score calculated as 2 points each if present [Age > 75, or Stroke/TIA/TE]  2. Obesity Body mass index is 31.55 kg/m. Lifestyle modification was discussed and encouraged including regular physical activity and weight reduction.  3. CAD S/p CABG. No anginal symptoms.  4. HTN Stable, no changes today.  5. Sick sinus syndrome S/p PPM, followed by Dr 08-27-2000 and the device clinic.   Follow up in the AF clinic in 2 weeks.    Elberta Fortis PA-C Afib Clinic [facility] [address] [place], [place] [phone] 10/04/2019 12:33 PM

## 2019-10-19 ENCOUNTER — Encounter (HOSPITAL_COMMUNITY): Payer: Self-pay | Admitting: Physician Assistant

## 2019-10-19 ENCOUNTER — Encounter (HOSPITAL_COMMUNITY): Payer: Self-pay

## 2019-10-19 ENCOUNTER — Ambulatory Visit (HOSPITAL_COMMUNITY)
Admission: RE | Admit: 2019-10-19 | Discharge: 2019-10-19 | Disposition: A | Discharge status: Home / Self Care | Payer: Medicare Other | Source: Ambulatory Visit | Attending: Physician Assistant | Admitting: Physician Assistant

## 2019-10-19 ENCOUNTER — Other Ambulatory Visit: Payer: Self-pay

## 2019-10-19 VITALS — BP 140/80 | HR 60 | Ht 65.0 in | Wt 186.2 lb

## 2019-10-19 DIAGNOSIS — Z7901 Long term (current) use of anticoagulants: Secondary | ICD-10-CM | POA: Diagnosis not present

## 2019-10-19 DIAGNOSIS — D6869 Other thrombophilia: Secondary | ICD-10-CM | POA: Diagnosis not present

## 2019-10-19 DIAGNOSIS — E669 Obesity, unspecified: Secondary | ICD-10-CM | POA: Diagnosis not present

## 2019-10-19 DIAGNOSIS — Z7982 Long term (current) use of aspirin: Secondary | ICD-10-CM | POA: Insufficient documentation

## 2019-10-19 DIAGNOSIS — I495 Sick sinus syndrome: Secondary | ICD-10-CM | POA: Insufficient documentation

## 2019-10-19 DIAGNOSIS — Z951 Presence of aortocoronary bypass graft: Secondary | ICD-10-CM | POA: Diagnosis not present

## 2019-10-19 DIAGNOSIS — I10 Essential (primary) hypertension: Secondary | ICD-10-CM | POA: Insufficient documentation

## 2019-10-19 DIAGNOSIS — Z6830 Body mass index (BMI) 30.0-30.9, adult: Secondary | ICD-10-CM | POA: Insufficient documentation

## 2019-10-19 DIAGNOSIS — Z8249 Family history of ischemic heart disease and other diseases of the circulatory system: Secondary | ICD-10-CM | POA: Diagnosis not present

## 2019-10-19 DIAGNOSIS — I2510 Atherosclerotic heart disease of native coronary artery without angina pectoris: Secondary | ICD-10-CM | POA: Insufficient documentation

## 2019-10-19 DIAGNOSIS — E785 Hyperlipidemia, unspecified: Secondary | ICD-10-CM | POA: Insufficient documentation

## 2019-10-19 DIAGNOSIS — Z87891 Personal history of nicotine dependence: Secondary | ICD-10-CM | POA: Diagnosis not present

## 2019-10-19 DIAGNOSIS — Z79899 Other long term (current) drug therapy: Secondary | ICD-10-CM | POA: Diagnosis not present

## 2019-10-19 DIAGNOSIS — I4892 Unspecified atrial flutter: Secondary | ICD-10-CM | POA: Diagnosis not present

## 2019-10-19 DIAGNOSIS — I480 Paroxysmal atrial fibrillation: Secondary | ICD-10-CM | POA: Insufficient documentation

## 2019-10-19 LAB — BASIC METABOLIC PANEL
Anion gap: 11 (ref 5–15)
BUN: 26 mg/dL — ABNORMAL HIGH (ref 8–23)
CO2: 27 mmol/L (ref 22–32)
Calcium: 10.5 mg/dL — ABNORMAL HIGH (ref 8.9–10.3)
Chloride: 91 mmol/L — ABNORMAL LOW (ref 98–111)
Creatinine, Ser: 1.6 mg/dL — ABNORMAL HIGH (ref 0.44–1.00)
GFR calc Af Amer: 37 mL/min — ABNORMAL LOW (ref >60)
GFR calc non Af Amer: 32 mL/min — ABNORMAL LOW (ref >60)
Glucose, Bld: 121 mg/dL — ABNORMAL HIGH (ref 70–99)
Potassium: 5 mmol/L (ref 3.5–5.1)
Sodium: 129 mmol/L — ABNORMAL LOW (ref 135–145)

## 2019-10-19 LAB — MAGNESIUM: Magnesium: 2.2 mg/dL (ref 1.7–2.4)

## 2019-10-19 NOTE — Progress Notes (Signed)
Primary Care Physician: Forrest Moron, MD Primary Cardiologist: Dr Dulce Sellar Primary Electrophysiologist: Dr Elberta Fortis Referring Physician: Dr Lenice Pressman is a 69 y.o. female with a history of SSS s/p PPM, CAD s/p CABG, HTN, renal artery stenosis, HLD, and paroxysmal atrial fibrillation who presents for follow up in the [facility]. Patient has a a previous cryoablation in 2019 and has been maintained on sotalol. She had more episodes of afib with symptoms of weakness, fatigue, and SOB and underwent repeat ablation with Dr Elberta Fortis on 03/22/19. She is on Eliquis for a CHADS2VASC score of 4. The device clinic received an alert for an ongoing afib episode since 10/02/19. Patient was unaware of her arrhythmia.   On follow up today, patient reports she has done well since her last visit. She is back in SR. She doesn't feel when she is in afib. She denies bleeding issues on anticoagulation.   Today, she denies symptoms of palpitations, chest pain, shortness of breath, orthopnea, PND, lower extremity edema, dizziness, presyncope, syncope, snoring, daytime somnolence, bleeding, or neurologic sequela. The patient is tolerating medications without difficulties and is otherwise without complaint today.    Atrial Fibrillation Risk Factors:  she does not have symptoms or diagnosis of sleep apnea. she does not have a history of rheumatic fever.  she has a BMI of Body mass index is 30.99 kg/m.Marland Kitchen Filed Weights   10/19/19 1356  Weight: 84.5 kg    Family History  Problem Relation Age of Onset  . Hypertension Mother   . Heart attack Father 71  . Hypertension Sister   . Hypertension Sister   . Hypertension Daughter   . Hypertension Son      Atrial Fibrillation Management history:  Previous antiarrhythmic drugs: sotalol  Previous cardioversions: remotely Previous ablations: cryoablation 2019, 03/22/19 (fib and flutter) CHADS2VASC score: 4 Anticoagulation  history: Eliquis   Past Medical History:  Diagnosis Date  . CAD in native artery 01/02/2019  . Carotid stenosis, bilateral 01/02/2019  . Essential hypertension 01/02/2019  . History of amiodarone therapy 01/02/2019  . Hx of CABG 01/02/2019  . Hyperlipidemia 01/02/2019  . PAF (paroxysmal atrial fibrillation) ([facility]) 01/02/2019  . Renal artery stenosis ([facility]) 01/02/2019  . Sick sinus syndrome ([facility]) 01/02/2019   Past Surgical History:  Procedure Laterality Date  . ATRIAL FIBRILLATION ABLATION N/A 03/22/2019   Procedure: ATRIAL FIBRILLATION ABLATION;  Surgeon: Regan Lemming, MD;  Location: [facility];  Service: Cardiovascular;  Laterality: N/A;  . BOWEL RESECTION    . CARDIOVERSION    . CORONARY ANGIOPLASTY WITH STENT PLACEMENT    . CORONARY ARTERY BYPASS GRAFT  2016   x 3 vessels  . CRYOABLATION    . INSERT / REPLACE / REMOVE PACEMAKER      Current Outpatient Medications  Medication Sig Dispense Refill  . acetaminophen (TYLENOL) 325 MG tablet Take 2 tablets (650 mg total) by mouth every 4 (four) hours as needed for headache or mild pain.    Marland Kitchen albuterol (VENTOLIN HFA) 108 (90 Base) MCG/ACT inhaler Inhale 2 puffs into the lungs every 6 (six) hours as needed for wheezing or shortness of breath.     . allopurinol (ZYLOPRIM) 100 MG tablet Take 100 mg by mouth daily.    Marland Kitchen ALPRAZolam (XANAX) 0.25 MG tablet Take 0.25 mg by mouth at bedtime as needed for anxiety.    Marland Kitchen apixaban (ELIQUIS) 5 MG TABS tablet Take 1 tablet (5 mg total) by mouth 2 (two)  times daily. 180 tablet 2  . aspirin EC 81 MG tablet Take 1 tablet by mouth daily.    Marland Kitchen atorvastatin (LIPITOR) 40 MG tablet TAKE 1 TABLET EVERY DAY    . carvedilol (COREG) 12.5 MG tablet Take 1 tablet (12.5 mg total) by mouth 2 (two) times daily. 180 tablet 1  . cloNIDine (CATAPRES) 0.1 MG tablet Take 0.1 mg by mouth 2 (two) times daily.    . furosemide (LASIX) 40 MG tablet Take 1 tablet in the morning and 0.5 tablet in the evening    .  levothyroxine (SYNTHROID) 75 MCG tablet Take 75 mcg by mouth daily before breakfast.     . losartan (COZAAR) 100 MG tablet Take 100 mg by mouth daily.    . Multiple Vitamins-Minerals (HAIR/SKIN/NAILS/BIOTIN PO) Take by mouth.    . nitroGLYCERIN (NITROSTAT) 0.4 MG SL tablet Place 0.4 mg under the tongue every 5 (five) minutes x 3 doses as needed.    . potassium chloride (MICRO-K) 10 MEQ CR capsule Take 10 mEq by mouth daily.    . pramipexole (MIRAPEX) 0.25 MG tablet Take 0.25 mg by mouth every evening.     . sotalol (BETAPACE) 80 MG tablet Take 1 tablet (80 mg total) by mouth 2 (two) times daily. 60 tablet 0  . vitamin B-12 (CYANOCOBALAMIN) 1000 MCG tablet Take 1,000 mcg by mouth daily.     No current facility-administered medications for this encounter.    Allergies  Allergen Reactions  . Tizanidine Itching and Other (See Comments)    "didnt like the way it made her feel" -- weakness "didnt like the way it made her feel" -- weakness   . Ace Inhibitors Other (See Comments)    unknown Cannot recall   . Hydrocodone-Homatropine Nausea And Vomiting  . Rofecoxib Nausea Only    MADE ME FEEL WEIRD     Social History   Socioeconomic History  . Marital status: Married    Spouse name: Not on file  . Number of children: Not on file  . Years of education: Not on file  . Highest education level: Not on file  Occupational History  . Not on file  Tobacco Use  . Smoking status: Former Smoker    Packs/day: 0.75    Years: 45.00    Pack years: 33.75    Types: Cigarettes    Quit date: 2009    Years since quitting: 12.3  . Smokeless tobacco: Never Used  Substance and Sexual Activity  . Alcohol use: Yes    Alcohol/week: 2.0 standard drinks    Types: 2 Cans of beer per week    Comment: 2 beers daily  . Drug use: Never  . Sexual activity: Not on file  Other Topics Concern  . Not on file  Social History Narrative  . Not on file   Social Determinants of Health   Financial Resource  Strain:   . Difficulty of Paying Living Expenses:   Food Insecurity:   . Worried About Charity fundraiser in the Last Year:   . Arboriculturist in the Last Year:   Transportation Needs:   . Film/video editor (Medical):   Marland Kitchen Lack of Transportation (Non-Medical):   Physical Activity:   . Days of Exercise per Week:   . Minutes of Exercise per Session:   Stress:   . Feeling of Stress :   Social Connections:   . Frequency of Communication with Friends and Family:   . Frequency of  Social Gatherings with Friends and Family:   . Attends Religious Services:   . Active Member of Clubs or Organizations:   . Attends Banker Meetings:   Marland Kitchen Marital Status:   Intimate Partner Violence:   . Fear of Current or Ex-Partner:   . Emotionally Abused:   Marland Kitchen Physically Abused:   . Sexually Abused:      ROS- All systems are reviewed and negative except as per the HPI above.  Physical Exam: Vitals:   10/19/19 1356  BP: 140/80  Pulse: 60  Weight: 84.5 kg  Height: 5\' 5"  (1.651 m)    GEN- The patient is well appearing obese female, alert and oriented x 3 today.   HEENT-head normocephalic, atraumatic, sclera clear, conjunctiva pink, hearing intact, trachea midline. Lungs- Clear to ausculation bilaterally, normal work of breathing Heart- Regular rate and rhythm, no murmurs, rubs or gallops  GI- soft, NT, ND, + BS Extremities- no clubbing, cyanosis, or edema MS- no significant deformity or atrophy Skin- no rash or lesion Psych- euthymic mood, full affect Neuro- strength and sensation are intact   Wt Readings from Last 3 Encounters:  10/19/19 84.5 kg  10/04/19 86 kg  07/18/19 86.6 kg    EKG today demonstrates AV dual pacing HR 60, PR 202, QRS 192, QTc 528  Echo 01/10/19 demonstrated   1. Mild hypokinesis of the left ventricular, basal-mid inferoseptal wall.  2. The left ventricle has low normal systolic function, with an ejection fraction of 50-55%. The cavity size was  normal. Left ventricular diastolic Doppler parameters are consistent with pseudonormalization.  3. The right ventricle has normal systolic function. The cavity was normal. There is no increase in right ventricular wall thickness.  4. Left atrial size was mildly dilated.  5. Mild thickening of the aortic valve. Mild calcification of the aortic valve. Aortic valve regurgitation was not assessed by color flow Doppler.  6. The aorta is normal in size and structure.   Epic records are reviewed at length today  Assessment and Plan:  1. Paroxysmal atrial fibrillation/typical atrial flutter S/p cryoablation 2019 and repeat ablation 03/22/19 with Dr 05/22/19. Patient back in SR. Will not pursue DCCV. Could consider changing AAD if afib episodes become more persistent.  Continue Eliquis 5 mg BID  Continue sotalol 80 mg BID. QT stable. Check bmet/mag today. Continue Coreg 12.5 mg BID  This patients CHA2DS2-VASc Score and unadjusted Ischemic Stroke Rate (% per year) is equal to 4.8 % stroke rate/year from a score of 4  Above score calculated as 1 point each if present [CHF, HTN, DM, Vascular=MI/PAD/Aortic Plaque, Age if 65-74, or Female] Above score calculated as 2 points each if present [Age > 75, or Stroke/TIA/TE]  2. Obesity Body mass index is 30.99 kg/m. Lifestyle modification was discussed and encouraged including regular physical activity and weight reduction.  3. CAD S/p CABG. No anginal symptoms.  4. HTN Stable, no changes today.  5. Sick sinus syndrome S/p PPM, followed by Dr 08-27-2000 and the device clinic.   Follow up with Dr Elberta Fortis per recall. AF clinic in 6 months.     Elberta Fortis PA-C Afib Clinic [facility] [address] [place], [place] [phone] 10/19/2019 2:15 PM

## 2019-11-22 ENCOUNTER — Other Ambulatory Visit (HOSPITAL_COMMUNITY): Payer: Self-pay | Admitting: Cardiology

## 2019-11-23 MED ORDER — SOTALOL HCL 80 MG PO TABS: 80.0000 mg | ORAL_TABLET | Freq: Two times a day (BID) | ORAL | 3 refills | Status: DC

## 2019-11-23 NOTE — Telephone Encounter (Signed)
Eliquis 5mg  refill request received. Patient is 69 years old, weight-84.5kg, Crea-1.60 on 10/19/2019, Diagnosis-Afib, and last seen by Clint PA on 10/19/2019. Dose is appropriate based on dosing criteria. Will send in refill to requested pharmacy.

## 2019-12-07 ENCOUNTER — Ambulatory Visit (INDEPENDENT_AMBULATORY_CARE_PROVIDER_SITE_OTHER): Payer: Medicare Other | Admitting: *Deleted

## 2019-12-07 DIAGNOSIS — I495 Sick sinus syndrome: Secondary | ICD-10-CM

## 2019-12-07 LAB — CUP PACEART REMOTE DEVICE CHECK
Battery Remaining Longevity: 121 mo
Battery Remaining Percentage: 95.5 %
Battery Voltage: 3.01 V
Brady Statistic AP VP Percent: 88 %
Brady Statistic AP VS Percent: 1.7 %
Brady Statistic AS VP Percent: 2.8 %
Brady Statistic AS VS Percent: 7.3 %
Brady Statistic RA Percent Paced: 85 %
Brady Statistic RV Percent Paced: 89 %
Date Time Interrogation Session: 20210623020014
Implantable Lead Implant Date: 20190909
Implantable Lead Implant Date: 20190909
Implantable Lead Location: 753859
Implantable Lead Location: 753860
Implantable Pulse Generator Implant Date: 20190909
Lead Channel Impedance Value: 450 Ohm
Lead Channel Impedance Value: 540 Ohm
Lead Channel Pacing Threshold Amplitude: 0.875 V
Lead Channel Pacing Threshold Amplitude: 1 V
Lead Channel Pacing Threshold Pulse Width: 0.4 ms
Lead Channel Pacing Threshold Pulse Width: 0.4 ms
Lead Channel Sensing Intrinsic Amplitude: 12 mV
Lead Channel Sensing Intrinsic Amplitude: 2.1 mV
Lead Channel Setting Pacing Amplitude: 1.25 V
Lead Channel Setting Pacing Amplitude: 1.875
Lead Channel Setting Pacing Pulse Width: 0.4 ms
Lead Channel Setting Sensing Sensitivity: 2 mV
Pulse Gen Model: 2272
Pulse Gen Serial Number: 9053085

## 2019-12-08 NOTE — Progress Notes (Signed)
Remote pacemaker transmission.   

## 2020-01-09 ENCOUNTER — Encounter: Payer: Self-pay | Admitting: Cardiology

## 2020-01-09 ENCOUNTER — Ambulatory Visit (INDEPENDENT_AMBULATORY_CARE_PROVIDER_SITE_OTHER): Payer: Medicare Other | Admitting: Cardiology

## 2020-01-09 ENCOUNTER — Other Ambulatory Visit: Payer: Self-pay

## 2020-01-09 VITALS — BP 132/84 | HR 60 | Ht 66.0 in | Wt 187.0 lb

## 2020-01-09 DIAGNOSIS — I2510 Atherosclerotic heart disease of native coronary artery without angina pectoris: Secondary | ICD-10-CM | POA: Diagnosis not present

## 2020-01-09 DIAGNOSIS — I480 Paroxysmal atrial fibrillation: Secondary | ICD-10-CM

## 2020-01-09 DIAGNOSIS — Z45018 Encounter for adjustment and management of other part of cardiac pacemaker: Secondary | ICD-10-CM

## 2020-01-09 DIAGNOSIS — I495 Sick sinus syndrome: Secondary | ICD-10-CM | POA: Diagnosis not present

## 2020-01-09 NOTE — Progress Notes (Signed)
Electrophysiology Office Note   Date:  01/09/2020   ID:  Diane Valentine, DOB Oct 11, 1950, MRN [id number]  PCP:  Forrest Moron, MD  Cardiologist:  Dulce Sellar Primary Electrophysiologist:  Cashton Hosley Jorja Loa, MD    No chief complaint on file.    History of Present Illness: Diane Valentine is a 69 y.o. female who is being seen today for the evaluation of pacemaker at the request of Forrest Moron, MD. Presenting today for electrophysiology evaluation.  She has a history significant for paroxysmal atrial fibrillation, sick sinus syndrome status post Jude pacemaker, coronary artery disease status post CABG, hypertension, renal artery stenosis, hyperlipidemia.  She has had a cryoablation in 2019 for atrial fibrillation and is currently on sotalol.  Her main symptoms are weakness, fatigue, and mild shortness of breath.  She also has palpitations at times.  She feels that she has been out of rhythm for quite some time.  She is now status post repeat AF ablation 03/22/2019.  Today, denies symptoms of palpitations, chest pain, shortness of breath, orthopnea, PND, lower extremity edema, claudication, dizziness, presyncope, syncope, bleeding, or neurologic sequela. The patient is tolerating medications without difficulties.  Overall she is doing well.  She has no chest pain or shortness of breath.  She is able to do all her daily activities.  She has noted no further episodes of atrial fibrillation.   Past Medical History:  Diagnosis Date   CAD in native artery 01/02/2019   Carotid stenosis, bilateral 01/02/2019   Essential hypertension 01/02/2019   History of amiodarone therapy 01/02/2019   Hx of CABG 01/02/2019   Hyperlipidemia 01/02/2019   PAF (paroxysmal atrial fibrillation) (HCC) 01/02/2019   Renal artery stenosis (HCC) 01/02/2019   Sick sinus syndrome (HCC) 01/02/2019   Past Surgical History:  Procedure Laterality Date   ATRIAL FIBRILLATION ABLATION N/A 03/22/2019   Procedure: ATRIAL  FIBRILLATION ABLATION;  Surgeon: Regan Lemming, MD;  Location: [facility];  Service: Cardiovascular;  Laterality: N/A;   BOWEL RESECTION     CARDIOVERSION     CORONARY ANGIOPLASTY WITH STENT PLACEMENT     CORONARY ARTERY BYPASS GRAFT  2016   x 3 vessels   CRYOABLATION     INSERT / REPLACE / REMOVE PACEMAKER       Current Outpatient Medications  Medication Sig Dispense Refill   acetaminophen (TYLENOL) 325 MG tablet Take 2 tablets (650 mg total) by mouth every 4 (four) hours as needed for headache or mild pain.     albuterol (VENTOLIN HFA) 108 (90 Base) MCG/ACT inhaler Inhale 2 puffs into the lungs every 6 (six) hours as needed for wheezing or shortness of breath.      allopurinol (ZYLOPRIM) 100 MG tablet Take 100 mg by mouth daily.     ALPRAZolam (XANAX) 0.25 MG tablet Take 0.25 mg by mouth at bedtime as needed for anxiety.     aspirin EC 81 MG tablet Take 1 tablet by mouth daily.     atorvastatin (LIPITOR) 40 MG tablet TAKE 1 TABLET EVERY DAY     carvedilol (COREG) 12.5 MG tablet Take 1 tablet (12.5 mg total) by mouth 2 (two) times daily. 180 tablet 1   cloNIDine (CATAPRES) 0.1 MG tablet Take 0.1 mg by mouth 2 (two) times daily.     ELIQUIS 5 MG TABS tablet TAKE 1 TABLET BY MOUTH TWICE A DAY 180 tablet 2   furosemide (LASIX) 40 MG tablet Take 1 tablet in the morning and 0.5 tablet in the evening  levothyroxine (SYNTHROID) 75 MCG tablet Take 75 mcg by mouth daily before breakfast.      losartan (COZAAR) 100 MG tablet Take 100 mg by mouth daily.     Multiple Vitamins-Minerals (HAIR/SKIN/NAILS/BIOTIN PO) Take by mouth.     nitroGLYCERIN (NITROSTAT) 0.4 MG SL tablet Place 0.4 mg under the tongue every 5 (five) minutes x 3 doses as needed.     potassium chloride (KLOR-CON) 10 MEQ tablet Take 10 mEq by mouth every other day.     pramipexole (MIRAPEX) 0.25 MG tablet Take 0.25 mg by mouth every evening.      sotalol (BETAPACE) 80 MG tablet Take 1 tablet  (80 mg total) by mouth 2 (two) times daily. 180 tablet 3   vitamin B-12 (CYANOCOBALAMIN) 1000 MCG tablet Take 1,000 mcg by mouth daily.     No current facility-administered medications for this visit.    Allergies:   Tizanidine, Ace inhibitors, Hydrocodone-homatropine, and Rofecoxib   Social History:  The patient  reports that she quit smoking about 12 years ago. Her smoking use included cigarettes. She has a 33.75 pack-year smoking history. She has never used smokeless tobacco. She reports current alcohol use of about 2.0 standard drinks of alcohol per week. She reports that she does not use drugs.   Family History:  The patient's family history includes Heart attack (age of onset: 72) in her father; Hypertension in her daughter, mother, sister, sister, and son.    ROS:  Please see the history of present illness.   Otherwise, review of systems is positive for none.   All other systems are reviewed and negative.   PHYSICAL EXAM: VS:  BP (!) 132/84    Pulse 60    Ht 5\' 6"  (1.676 m)    Wt 187 lb (84.8 kg)    SpO2 97%    BMI 30.18 kg/m  , BMI Body mass index is 30.18 kg/m. GEN: Well nourished, well developed, in no acute distress  HEENT: normal  Neck: no JVD, carotid bruits, or masses Cardiac: RRR; no murmurs, rubs, or gallops,no edema  Respiratory:  clear to auscultation bilaterally, normal work of breathing GI: soft, nontender, nondistended, + BS MS: no deformity or atrophy  Skin: warm and dry, device site well healed Neuro:  Strength and sensation are intact Psych: euthymic mood, full affect  EKG:  EKG is ordered today. Personal review of the ekg ordered shows AV paced  Personal review of the device interrogation today. Results in [facility]    Recent Labs: 03/08/2019: Hemoglobin 13.9; Platelets 159 10/19/2019: BUN 26; Creatinine, Ser 1.60; Magnesium 2.2; Potassium 5.0; Sodium 129    Lipid Panel     Component Value Date/Time   CHOL 167 01/03/2019 1713   TRIG 133 01/03/2019  1713   HDL 61 01/03/2019 1713   CHOLHDL 2.7 01/03/2019 1713   LDLCALC 79 01/03/2019 1713     Wt Readings from Last 3 Encounters:  01/09/20 187 lb (84.8 kg)  10/19/19 186 lb 3.2 oz (84.5 kg)  10/04/19 189 lb 9.6 oz (86 kg)      Other studies Reviewed: Additional studies/ records that were reviewed today include: TTE 01/10/19  Review of the above records today demonstrates:   1. Mild hypokinesis of the left ventricular, basal-mid inferoseptal wall.  2. The left ventricle has low normal systolic function, with an ejection fraction of 50-55%. The cavity size was normal. Left ventricular diastolic Doppler parameters are consistent with pseudonormalization.  3. The right ventricle has normal systolic function.  The cavity was normal. There is no increase in right ventricular wall thickness.  4. Left atrial size was mildly dilated.  5. Mild thickening of the aortic valve. Mild calcification of the aortic valve. Aortic valve regurgitation was not assessed by color flow Doppler.  6. The aorta is normal in size and structure.   ASSESSMENT AND PLAN:  1.  Sick sinus syndrome: Status post Saint Jude dual-chamber pacemaker.  Device functioning appropriately.  No changes at this time.  Her pacemaker Giovonni Poirier use when she is laying on her right side.  She is now putting a pillow when she sleeps on that side which is a new symptom.  Is never gone 180 degrees.  It is certainly notable on physical exam, although I cannot get it to go more than 70 degrees.  I feel that it is scarred down stable and I do not need a pocket revision at this time.  2.  Atrial fibrillation, paroxysmal: Status post cryoablation.  Is now status post repeat ablation 03/22/2019.  Currently on sotalol and Eliquis.  CHA2DS2-VASc of 4.  This patients CHA2DS2-VASc Score and unadjusted Ischemic Stroke Rate (% per year) is equal to 4.8 % stroke rate/year from a score of 4  Above score calculated as 1 point each if present [CHF, HTN, DM,  Vascular=MI/PAD/Aortic Plaque, Age if 65-74, or Female] Above score calculated as 2 points each if present [Age > 75, or Stroke/TIA/TE]  3.  Hypertension: Currently well controlled  4.  Hyperlipidemia: Continue statin per primary cardiology   Current medicines are reviewed at length with the patient today.   The patient does not have concerns regarding her medicines.  The following changes were made today: None  Labs/ tests ordered today include:  Orders Placed This Encounter  Procedures   EKG 12-Lead    Disposition:   FU with Letica Giaimo 6 months  Signed, Rosalynd Mcwright Jorja Loa, MD  01/09/2020 2:50 PM     [facility] [address] [place] [address] [phone] (office) [phone] (fax)

## 2020-01-09 NOTE — Patient Instructions (Signed)
Medication Instructions:  Your physician recommends that you continue on your current medications as directed. Please refer to the Current Medication list given to you today. *If you need a refill on your cardiac medications before your next appointment, please call your pharmacy*   Lab Work: None ordered.  If you have labs (blood work) drawn today and your tests are completely normal, you will receive your results only by: . MyChart Message (if you have MyChart) OR . A paper copy in the mail If you have any lab test that is abnormal or we need to change your treatment, we will call you to review the results.   Testing/Procedures: None ordered.    Follow-Up: At [facility], you and your health needs are our priority.  As part of our continuing mission to provide you with exceptional heart care, we have created designated Provider Care Teams.  These Care Teams include your primary Cardiologist (physician) and Advanced Practice Providers (APPs -  Physician Assistants and Nurse Practitioners) who all work together to provide you with the care you need, when you need it.  We recommend signing up for the patient portal called "MyChart".  Sign up information is provided on this After Visit Summary.  MyChart is used to connect with patients for Virtual Visits (Telemedicine).  Patients are able to view lab/test results, encounter notes, upcoming appointments, etc.  Non-urgent messages can be sent to your provider as well.   To learn more about what you can do with MyChart, go to [email or web address].    Your next appointment:   6 month(s)  The format for your next appointment:   In Person  Provider:   Will Camnitz, MD     

## 2020-02-10 ENCOUNTER — Telehealth: Payer: Self-pay | Admitting: Student

## 2020-02-10 NOTE — Telephone Encounter (Signed)
Merlin alert with increase AF burden, including 21 hr episode yesterday, 8/26.  Pt states she had a very stressful day yesterday, but denies any symptoms. No palpitations, dizziness, or SOB.   Forward to Dr. Elberta Fortis as an FYI with her history of ablations. (2019 and repeat 03/22/19) and on sotalol.    Per AF clinic note 10/19/2019 could consider alternate AAD if afib becomes more persistent.   Pt is content with monitoring for now due to lack of symptoms.   Casimiro Needle [address]" [place], PA-C  02/10/2020 9:31 AM

## 2020-02-22 ENCOUNTER — Encounter: Payer: Self-pay | Admitting: Cardiology

## 2020-02-22 ENCOUNTER — Other Ambulatory Visit: Payer: Self-pay

## 2020-02-22 ENCOUNTER — Ambulatory Visit (INDEPENDENT_AMBULATORY_CARE_PROVIDER_SITE_OTHER): Payer: Medicare Other | Admitting: Cardiology

## 2020-02-22 VITALS — BP 142/84 | HR 72 | Ht 66.0 in | Wt 190.4 lb

## 2020-02-22 DIAGNOSIS — I480 Paroxysmal atrial fibrillation: Secondary | ICD-10-CM

## 2020-02-22 DIAGNOSIS — I495 Sick sinus syndrome: Secondary | ICD-10-CM

## 2020-02-22 DIAGNOSIS — I10 Essential (primary) hypertension: Secondary | ICD-10-CM

## 2020-02-22 DIAGNOSIS — I2510 Atherosclerotic heart disease of native coronary artery without angina pectoris: Secondary | ICD-10-CM | POA: Diagnosis not present

## 2020-02-22 NOTE — Progress Notes (Signed)
Cardiology Office Note:    Date:  02/22/2020   ID:  Diane Valentine, DOB 06-May-1951, MRN [id number]  PCP:  Forrest Moron, MD  Cardiologist:  Thomasene Ripple, DO  Electrophysiologist:  Will Jorja Loa, MD   Referring MD: Forrest Moron, MD   " I feel really tired"  History of Present Illness:    Diane Valentine is a 69 y.o. female with a hx of coronary artery disease, status post CABG, atrial fibrillation status post ablation and on Eliquis as well as sotalol, hyperlipidemia.  The patient is here today for follow-up visit.  Last saw the patient on July 18, 2019.  At that visit she was stable from a cardiovascular standpoint.  In the interim she has seen EP at that time her device was interrogated and it was functioning appropriately.  No changes were made.  Today she is here for follow-up visit.  She told me she has been significantly fatigue.  There is some shortness of breath but that has improved but the fatigue is getting worse.  She denies any chest pain.  Past Medical History:  Diagnosis Date  . CAD in native artery 01/02/2019  . Carotid stenosis, bilateral 01/02/2019  . Essential hypertension 01/02/2019  . History of amiodarone therapy 01/02/2019  . Hx of CABG 01/02/2019  . Hyperlipidemia 01/02/2019  . PAF (paroxysmal atrial fibrillation) (HCC) 01/02/2019  . Renal artery stenosis (HCC) 01/02/2019  . Sick sinus syndrome (HCC) 01/02/2019    Past Surgical History:  Procedure Laterality Date  . ATRIAL FIBRILLATION ABLATION N/A 03/22/2019   Procedure: ATRIAL FIBRILLATION ABLATION;  Surgeon: Regan Lemming, MD;  Location: [facility];  Service: Cardiovascular;  Laterality: N/A;  . BOWEL RESECTION    . CARDIOVERSION    . CORONARY ANGIOPLASTY WITH STENT PLACEMENT    . CORONARY ARTERY BYPASS GRAFT  2016   x 3 vessels  . CRYOABLATION    . INSERT / REPLACE / REMOVE PACEMAKER      Current Medications: Current Meds  Medication Sig  . acetaminophen (TYLENOL) 325 MG tablet  Take 2 tablets (650 mg total) by mouth every 4 (four) hours as needed for headache or mild pain.  Marland Kitchen albuterol (VENTOLIN HFA) 108 (90 Base) MCG/ACT inhaler Inhale 2 puffs into the lungs every 6 (six) hours as needed for wheezing or shortness of breath.   . allopurinol (ZYLOPRIM) 100 MG tablet Take 100 mg by mouth daily.  Marland Kitchen ALPRAZolam (XANAX) 0.25 MG tablet Take 0.25 mg by mouth at bedtime as needed for anxiety.  Marland Kitchen aspirin EC 81 MG tablet Take 1 tablet by mouth daily.  Marland Kitchen atorvastatin (LIPITOR) 40 MG tablet TAKE 1 TABLET EVERY DAY  . carvedilol (COREG) 12.5 MG tablet Take 1 tablet (12.5 mg total) by mouth 2 (two) times daily.  . cloNIDine (CATAPRES) 0.1 MG tablet Take 0.1 mg by mouth 2 (two) times daily.  Marland Kitchen ELIQUIS 5 MG TABS tablet TAKE 1 TABLET BY MOUTH TWICE A DAY  . furosemide (LASIX) 40 MG tablet Take 1 tablet in the morning and 0.5 tablet in the evening  . levothyroxine (SYNTHROID) 75 MCG tablet Take 75 mcg by mouth daily before breakfast.   . losartan (COZAAR) 100 MG tablet Take 100 mg by mouth daily.  . Multiple Vitamins-Minerals (HAIR/SKIN/NAILS/BIOTIN PO) Take by mouth.  . nitroGLYCERIN (NITROSTAT) 0.4 MG SL tablet Place 0.4 mg under the tongue every 5 (five) minutes x 3 doses as needed.  . potassium chloride (KLOR-CON) 10 MEQ tablet Take 10 mEq  by mouth every other day.  . pramipexole (MIRAPEX) 0.25 MG tablet Take 0.25 mg by mouth every evening.   . sotalol (BETAPACE) 80 MG tablet Take 1 tablet (80 mg total) by mouth 2 (two) times daily.  . vitamin B-12 (CYANOCOBALAMIN) 1000 MCG tablet Take 1,000 mcg by mouth daily.     Allergies:   Tizanidine, Ace inhibitors, Hydrocodone-homatropine, and Rofecoxib   Social History   Socioeconomic History  . Marital status: Married    Spouse name: Not on file  . Number of children: Not on file  . Years of education: Not on file  . Highest education level: Not on file  Occupational History  . Not on file  Tobacco Use  . Smoking status: Former  Smoker    Packs/day: 0.75    Years: 45.00    Pack years: 33.75    Types: Cigarettes    Quit date: 2009    Years since quitting: 12.6  . Smokeless tobacco: Never Used  Vaping Use  . Vaping Use: Never used  Substance and Sexual Activity  . Alcohol use: Yes    Alcohol/week: 2.0 standard drinks    Types: 2 Cans of beer per week    Comment: 2 beers daily  . Drug use: Never  . Sexual activity: Not on file  Other Topics Concern  . Not on file  Social History Narrative  . Not on file   Social Determinants of Health   Financial Resource Strain:   . Difficulty of Paying Living Expenses: Not on file  Food Insecurity:   . Worried About Programme researcher, broadcasting/film/video in the Last Year: Not on file  . Ran Out of Food in the Last Year: Not on file  Transportation Needs:   . Lack of Transportation (Medical): Not on file  . Lack of Transportation (Non-Medical): Not on file  Physical Activity:   . Days of Exercise per Week: Not on file  . Minutes of Exercise per Session: Not on file  Stress:   . Feeling of Stress : Not on file  Social Connections:   . Frequency of Communication with Friends and Family: Not on file  . Frequency of Social Gatherings with Friends and Family: Not on file  . Attends Religious Services: Not on file  . Active Member of Clubs or Organizations: Not on file  . Attends Banker Meetings: Not on file  . Marital Status: Not on file     Family History: The patient's family history includes Heart attack (age of onset: 53) in her father; Hypertension in her daughter, mother, sister, sister, and son.  ROS:   Review of Systems  Constitution: Negative for decreased appetite, fever and weight gain.  HENT: Negative for congestion, ear discharge, hoarse voice and sore throat.   Eyes: Negative for discharge, redness, vision loss in right eye and visual halos.  Cardiovascular: Negative for chest pain, dyspnea on exertion, leg swelling, orthopnea and palpitations.    Respiratory: Negative for cough, hemoptysis, shortness of breath and snoring.   Endocrine: Negative for heat intolerance and polyphagia.  Hematologic/Lymphatic: Negative for bleeding problem. Does not bruise/bleed easily.  Skin: Negative for flushing, nail changes, rash and suspicious lesions.  Musculoskeletal: Negative for arthritis, joint pain, muscle cramps, myalgias, neck pain and stiffness.  Gastrointestinal: Negative for abdominal pain, bowel incontinence, diarrhea and excessive appetite.  Genitourinary: Negative for decreased libido, genital sores and incomplete emptying.  Neurological: Negative for brief paralysis, focal weakness, headaches and loss of balance.  Psychiatric/Behavioral: Negative for altered mental status, depression and suicidal ideas.  Allergic/Immunologic: Negative for HIV exposure and persistent infections.    EKGs/Labs/Other Studies Reviewed:    The following studies were reviewed today:   EKG:  The ekg ordered today demonstrates ventricular paced rhythm with underlying atrial fibrillation.  Other studies Reviewed: Additional studies/ records that were reviewed today include: TTE 01/10/19  Review of the above records today demonstrates:  1. Mild hypokinesis of the left ventricular, basal-mid inferoseptal wall. 2. The left ventricle has low normal systolic function, with an ejection fraction of 50-55%. The cavity size was normal. Left ventricular diastolic Doppler parameters are consistent with pseudonormalization. 3. The right ventricle has normal systolic function. The cavity was normal. There is no increase in right ventricular wall thickness. 4. Left atrial size was mildly dilated. 5. Mild thickening of the aortic valve. Mild calcification of the aortic valve. Aortic valve regurgitation was not assessed by color flow Doppler. 6. The aorta is normal in size and structure.  Recent Labs: 03/08/2019: Hemoglobin 13.9; Platelets 159 10/19/2019: BUN 26;  Creatinine, Ser 1.60; Magnesium 2.2; Potassium 5.0; Sodium 129  Recent Lipid Panel    Component Value Date/Time   CHOL 167 01/03/2019 1713   TRIG 133 01/03/2019 1713   HDL 61 01/03/2019 1713   CHOLHDL 2.7 01/03/2019 1713   LDLCALC 79 01/03/2019 1713    Physical Exam:    VS:  BP (!) 142/84   Pulse 72   Ht 5\' 6"  (1.676 m)   Wt 190 lb 6.4 oz (86.4 kg)   SpO2 95%   BMI 30.73 kg/m     Wt Readings from Last 3 Encounters:  02/22/20 190 lb 6.4 oz (86.4 kg)  01/09/20 187 lb (84.8 kg)  10/19/19 186 lb 3.2 oz (84.5 kg)     GEN: Well nourished, well developed in no acute distress HEENT: Normal NECK: No JVD; No carotid bruits LYMPHATICS: No lymphadenopathy CARDIAC: S1S2 noted,RRR, no murmurs, rubs, gallops RESPIRATORY:  Clear to auscultation without rales, wheezing or rhonchi  ABDOMEN: Soft, non-tender, non-distended, +bowel sounds, no guarding. EXTREMITIES: No edema, No cyanosis, no clubbing MUSCULOSKELETAL:  No deformity  SKIN: Warm and dry NEUROLOGIC:  Alert and oriented x 3, non-focal PSYCHIATRIC:  Normal affect, good insight  ASSESSMENT:    1. Paroxysmal atrial fibrillation (HCC)   2. Essential hypertension   3. CAD in native artery   4. Sick sinus syndrome (HCC)    PLAN:      She is V paced with underlying A. fib.  She has fatigue.  She has had an ablation as well as multiple cardioversions.  She is currently on sotalol.  Given her chronic kidney disease.  Would like to repeat her kidney function before changing her sotalol.  But she tells me that she is going to be getting blood work with her PCP soon and will rather wait for that.  For now I would like to have her see Dr. 12/19/19 sooner than already scheduled to see if she needs another cardioversion or assess her for a redo ablation as she is symptomatic.  The patient is in agreement with the above plan. The patient left the office in stable condition.  The patient will follow up in   Medication Adjustments/Labs and  Tests Ordered: Current medicines are reviewed at length with the patient today.  Concerns regarding medicines are outlined above.  Orders Placed This Encounter  Procedures  . Ambulatory referral to Cardiac Electrophysiology  . EKG 12-Lead  No orders of the defined types were placed in this encounter.   Patient Instructions  Medication Instructions:  Your physician recommends that you continue on your current medications as directed. Please refer to the Current Medication list given to you today.  *If you need a refill on your cardiac medications before your next appointment, please call your pharmacy*   Lab Work: None If you have labs (blood work) drawn today and your tests are completely normal, you will receive your results only by: Marland Kitchen MyChart Message (if you have MyChart) OR . A paper copy in the mail If you have any lab test that is abnormal or we need to change your treatment, we will call you to review the results.   Testing/Procedures: None   Follow-Up: At [facility], you and your health needs are our priority.  As part of our continuing mission to provide you with exceptional heart care, we have created designated Provider Care Teams.  These Care Teams include your primary Cardiologist (physician) and Advanced Practice Providers (APPs -  Physician Assistants and Nurse Practitioners) who all work together to provide you with the care you need, when you need it.  We recommend signing up for the patient portal called "MyChart".  Sign up information is provided on this After Visit Summary.  MyChart is used to connect with patients for Virtual Visits (Telemedicine).  Patients are able to view lab/test results, encounter notes, upcoming appointments, etc.  Non-urgent messages can be sent to your provider as well.   To learn more about what you can do with MyChart, go to [email or web address].    Your next appointment:   3 month(s)  The format for your next appointment:     In Person  Provider:   Thomasene Ripple, DO   Other Instructions We have put in the referral for you to see our Electrophysiology Dr. Quincy Carnes will call you to schedule this appointment     Adopting a Healthy Lifestyle.  Know what a healthy weight is for you (roughly BMI <25) and aim to maintain this   Aim for 7+ servings of fruits and vegetables daily   65-80+ fluid ounces of water or unsweet tea for healthy kidneys   Limit to max 1 drink of alcohol per day; avoid smoking/tobacco   Limit animal fats in diet for cholesterol and heart health - choose grass fed whenever available   Avoid highly processed foods, and foods high in saturated/trans fats   Aim for low stress - take time to unwind and care for your mental health   Aim for 150 min of moderate intensity exercise weekly for heart health, and weights twice weekly for bone health   Aim for 7-9 hours of sleep daily   When it comes to diets, agreement about the perfect plan isnt easy to find, even among the experts. Experts at the [facility] [employer] developed an idea known as the Healthy Eating Plate. Just imagine a plate divided into logical, healthy portions.   The emphasis is on diet quality:   Load up on vegetables and fruits - one-half of your plate: Aim for color and variety, and remember that potatoes dont count.   Go for whole grains - one-quarter of your plate: Whole wheat, barley, wheat berries, quinoa, oats, brown rice, and foods made with them. If you want pasta, go with whole wheat pasta.   Protein power - one-quarter of your plate: Fish, chicken, beans, and nuts are all healthy, versatile protein sources.  Limit red meat.   The diet, however, does go beyond the plate, offering a few other suggestions.   Use healthy plant oils, such as olive, canola, soy, corn, sunflower and peanut. Check the labels, and avoid partially hydrogenated oil, which have unhealthy trans fats.   If youre thirsty, drink  water. Coffee and tea are good in moderation, but skip sugary drinks and limit milk and dairy products to one or two daily servings.   The type of carbohydrate in the diet is more important than the amount. Some sources of carbohydrates, such as vegetables, fruits, whole grains, and beans-are healthier than others.   Finally, stay active  Signed, Thomasene RippleKardie Phelan Goers, DO  02/22/2020 3:20 PM    [facility]

## 2020-02-22 NOTE — Patient Instructions (Addendum)
Medication Instructions:  Your physician recommends that you continue on your current medications as directed. Please refer to the Current Medication list given to you today.  *If you need a refill on your cardiac medications before your next appointment, please call your pharmacy*   Lab Work: None If you have labs (blood work) drawn today and your tests are completely normal, you will receive your results only by: Marland Kitchen MyChart Message (if you have MyChart) OR . A paper copy in the mail If you have any lab test that is abnormal or we need to change your treatment, we will call you to review the results.   Testing/Procedures: None   Follow-Up: At [facility], you and your health needs are our priority.  As part of our continuing mission to provide you with exceptional heart care, we have created designated Provider Care Teams.  These Care Teams include your primary Cardiologist (physician) and Advanced Practice Providers (APPs -  Physician Assistants and Nurse Practitioners) who all work together to provide you with the care you need, when you need it.  We recommend signing up for the patient portal called "MyChart".  Sign up information is provided on this After Visit Summary.  MyChart is used to connect with patients for Virtual Visits (Telemedicine).  Patients are able to view lab/test results, encounter notes, upcoming appointments, etc.  Non-urgent messages can be sent to your provider as well.   To learn more about what you can do with MyChart, go to [email or web address].    Your next appointment:   3 month(s)  The format for your next appointment:   In Person  Provider:   Thomasene Ripple, DO   Other Instructions We have put in the referral for you to see our Electrophysiology Dr. Quincy Carnes will call you to schedule this appointment

## 2020-02-28 ENCOUNTER — Encounter: Payer: Self-pay | Admitting: Cardiology

## 2020-02-28 ENCOUNTER — Other Ambulatory Visit: Payer: Self-pay

## 2020-02-28 ENCOUNTER — Ambulatory Visit (INDEPENDENT_AMBULATORY_CARE_PROVIDER_SITE_OTHER): Payer: Medicare Other | Admitting: Cardiology

## 2020-02-28 VITALS — BP 146/92 | HR 76 | Ht 66.0 in | Wt 191.0 lb

## 2020-02-28 DIAGNOSIS — I495 Sick sinus syndrome: Secondary | ICD-10-CM | POA: Diagnosis not present

## 2020-02-28 DIAGNOSIS — I2510 Atherosclerotic heart disease of native coronary artery without angina pectoris: Secondary | ICD-10-CM

## 2020-02-28 DIAGNOSIS — I4892 Unspecified atrial flutter: Secondary | ICD-10-CM | POA: Diagnosis not present

## 2020-02-28 DIAGNOSIS — I480 Paroxysmal atrial fibrillation: Secondary | ICD-10-CM | POA: Diagnosis not present

## 2020-02-28 NOTE — Patient Instructions (Signed)
Medication Instructions:  Your physician recommends that you continue on your current medications as directed. Please refer to the Current Medication list given to you today.  *If you need a refill on your cardiac medications before your next appointment, please call your pharmacy*   Lab Work: Pre procedure labs ___________:  BMP & CBC If you have labs (blood work) drawn today and your tests are completely normal, you will receive your results only by: Marland Kitchen MyChart Message (if you have MyChart) OR . A paper copy in the mail If you have any lab test that is abnormal or we need to change your treatment, we will call you to review the results.   Testing/Procedures: Your physician has requested that you have cardiac CT within 7 days PRIOR to your ablation. Cardiac computed tomography (CT) is a painless test that uses an x-ray machine to take clear, detailed pictures of your heart.  Please follow instruction below located under "other instructions". You will get a call from our office to schedule the date for this test.  Your physician has recommended that you have an ablation. Catheter ablation is a medical procedure used to treat some cardiac arrhythmias (irregular heartbeats). During catheter ablation, a long, thin, flexible tube is put into a blood vessel in your groin (upper thigh), or neck. This tube is called an ablation catheter. It is then guided to your heart through the blood vessel. Radio frequency waves destroy small areas of heart tissue where abnormal heartbeats may cause an arrhythmia to start. Please follow instruction below located under "other instructions".   Follow-Up: At [facility], you and your health needs are our priority.  As part of our continuing mission to provide you with exceptional heart care, we have created designated Provider Care Teams.  These Care Teams include your primary Cardiologist (physician) and Advanced Practice Providers (APPs -  Physician Assistants  and Nurse Practitioners) who all work together to provide you with the care you need, when you need it.  We recommend signing up for the patient portal called "MyChart".  Sign up information is provided on this After Visit Summary.  MyChart is used to connect with patients for Virtual Visits (Telemedicine).  Patients are able to view lab/test results, encounter notes, upcoming appointments, etc.  Non-urgent messages can be sent to your provider as well.   To learn more about what you can do with MyChart, go to [email or web address].    Your next appointment:   1 month(s) after your ablation  The format for your next appointment:   In Person  Provider:   AFib clinic   Thank you for choosing [facility]!!   Trinidad Curet, RN [phone]    Other Instructions  CT INSTRUCTIONS Your cardiac CT will be scheduled at:  [facility] [address] [place], [place] [address] [phone]   Please arrive at the [facility] main entrance of [facility] at ________________ on _________________, please arrive 30 minutes prior to test start time. Proceed to the [facility] Radiology Department (first floor) to check-in and test prep.  Please follow these instructions carefully (unless otherwise directed):  On the Night Before the Test: . Be sure to Drink plenty of water. . Do not consume any caffeinated/decaffeinated beverages or chocolate 12 hours prior to your test. . Do not take any antihistamines 12 hours prior to your test.  On the Day of the Test: . Drink plenty of water. Do not drink any water within one hour  of the test. . Do not eat any food 4 hours prior to the test. . You may take your regular medications prior to the test.  . Take your Carvedilol & Sotalol  two hours prior to test. . HOLD Furosemide/Hydrochlorothiazide morning of the test. . FEMALES- please wear underwire-free bra if available       After the Test: . Drink plenty of  water. . After receiving IV contrast, you may experience a mild flushed feeling. This is normal. . On occasion, you may experience a mild rash up to 24 hours after the test. This is not dangerous. If this occurs, you can take Benadryl 25 mg and increase your fluid intake. . If you experience trouble breathing, this can be serious. If it is severe call 911 IMMEDIATELY. If it is mild, please call our office. . If you take any of these medications: Glipizide/Metformin, Avandament, Glucavance, please do not take 48 hours after completing test unless otherwise instructed.   Once we have confirmed authorization from your insurance company, we will call you to set up a date and time for your test. Based on how quickly your insurance processes prior authorizations requests, please allow up to 4 weeks to be contacted for scheduling your Cardiac CT appointment. Be advised that routine Cardiac CT appointments could be scheduled as many as 8 weeks after your provider has ordered it.  For non-scheduling related questions, please contact the cardiac imaging nurse navigator should you have any questions/concerns: Marchia Bond, Cardiac Imaging Nurse Navigator Burley Saver, Interim Cardiac Imaging Nurse [place] [facility] Direct Office Dial: [phone]   For scheduling needs, including cancellations and rescheduling, please call Vivien Rota at [phone], option 3.      Electrophysiology/Ablation Procedure Instructions   You are scheduled for a(n)  ablation on __________ with Dr. Allegra Lai.   1.   Pre procedure testing-             A.  LAB WORK --- On __________  for your pre procedure blood work.   You do NOT need to be fasting.               B. COVID TEST-- On _____________ @ __________ -  This is a Drive Up Visit at [address] [place], [place] [address].  Someone will direct you to the appropriate testing line. Stay in your car and someone will be with you shortly.    After you are tested please go home and self quarantine until the day of your procedure.     2. On the day of your procedure ______________ you will go to [facility] hospital [phone]) at ___________.  You will go to the main entrance [address] [employer]) and enter where the [employer] parking staff are.  Your driver will drop you off and you will head down the hallway to ADMITTING.  You may have one support person come in to the hospital with you.  They will be asked to wait in the waiting room.   3.   Do not eat or drink after midnight prior to your procedure.   4.   Do NOT take any medications the morning of your procedure.   5.  Plan for an overnight stay (if you go home same day, you will need someone to stay with you for 24 hours after the procedure).  If you use your phone frequently bring your phone charger.   6. You will follow up with  the AFIB clinic 4 weeks after your procedure.  You will follow up with Dr. Curt Bears  3 months after your procedure.  These appointments will be made for you.   * If you have ANY questions please call the office [phone] and ask for Marshall Kampf RN or send me a MyChart message   * Occasionally, EP Studies and ablations can become lengthy.  Please make your family aware of this before your procedure starts.  Average time ranges from 2-8 hours for EP studies/ablations.  Your physician will call your family after the procedure with the results.                                     Cardiac Ablation Cardiac ablation is a procedure to disable (ablate) a small amount of heart tissue in very specific places. The heart has many electrical connections. Sometimes these connections are abnormal and can cause the heart to beat very fast or irregularly. Ablating some of the problem areas can improve the heart rhythm or return it to normal. Ablation may be done for people who:  Have Wolff-Parkinson-White syndrome.  Have fast heart rhythms (tachycardia).  Have taken  medicines for an abnormal heart rhythm (arrhythmia) that were not effective or caused side effects.  Have a high-risk heartbeat that may be life-threatening. During the procedure, a small incision is made in the neck or the groin, and a long, thin, flexible tube (catheter) is inserted into the incision and moved to the heart. Small devices (electrodes) on the tip of the catheter will send out electrical currents. A type of X-ray (fluoroscopy) will be used to help guide the catheter and to provide images of the heart. Tell a health care provider about:  Any allergies you have.  All medicines you are taking, including vitamins, herbs, eye drops, creams, and over-the-counter medicines.  Any problems you or family members have had with anesthetic medicines.  Any blood disorders you have.  Any surgeries you have had.  Any medical conditions you have, such as kidney failure.  Whether you are pregnant or may be pregnant. What are the risks? Generally, this is a safe procedure. However, problems may occur, including:  Infection.  Bruising and bleeding at the catheter insertion site.  Bleeding into the chest, especially into the sac that surrounds the heart. This is a serious complication.  Stroke or blood clots.  Damage to other structures or organs.  Allergic reaction to medicines or dyes.  Need for a permanent pacemaker if the normal electrical system is damaged. A pacemaker is a small computer that sends electrical signals to the heart and helps your heart beat normally.  The procedure not being fully effective. This may not be recognized until months later. Repeat ablation procedures are sometimes required. What happens before the procedure?  Follow instructions from your health care provider about eating or drinking restrictions.  Ask your health care provider about: ? Changing or stopping your regular medicines. This is especially important if you are taking diabetes medicines  or blood thinners. ? Taking medicines such as aspirin and ibuprofen. These medicines can thin your blood. Do not take these medicines before your procedure if your health care provider instructs you not to.  Plan to have someone take you home from the hospital or clinic.  If you will be going home right after the procedure, plan to have someone with you for 24 hours.  What happens during the procedure?  To lower your risk of infection: ? Your health care team will wash or sanitize their hands. ? Your skin will be washed with soap. ? Hair may be removed from the incision area.  An IV tube will be inserted into one of your veins.  You will be given a medicine to help you relax (sedative).  The skin on your neck or groin will be numbed.  An incision will be made in your neck or your groin.  A needle will be inserted through the incision and into a large vein in your neck or groin.  A catheter will be inserted into the needle and moved to your heart.  Dye may be injected through the catheter to help your surgeon see the area of the heart that needs treatment.  Electrical currents will be sent from the catheter to ablate heart tissue in desired areas. There are three types of energy that may be used to ablate heart tissue: ? Heat (radiofrequency energy). ? Laser energy. ? Extreme cold (cryoablation).  When the necessary tissue has been ablated, the catheter will be removed.  Pressure will be held on the catheter insertion area to prevent excessive bleeding.  A bandage (dressing) will be placed over the catheter insertion area. The procedure may vary among health care providers and hospitals. What happens after the procedure?  Your blood pressure, heart rate, breathing rate, and blood oxygen level will be monitored until the medicines you were given have worn off.  Your catheter insertion area will be monitored for bleeding. You will need to lie still for a few hours to ensure that  you do not bleed from the catheter insertion area.  Do not drive for 24 hours or as long as directed by your health care provider. Summary  Cardiac ablation is a procedure to disable (ablate) a small amount of heart tissue in very specific places. Ablating some of the problem areas can improve the heart rhythm or return it to normal.  During the procedure, electrical currents will be sent from the catheter to ablate heart tissue in desired areas. This information is not intended to replace advice given to you by your health care provider. Make sure you discuss any questions you have with your health care provider. Document Revised: 11/23/2017 Document Reviewed: 04/21/2016 Elsevier Patient Education  [place].

## 2020-02-28 NOTE — Progress Notes (Signed)
Electrophysiology Office Note   Date:  02/28/2020   ID:  [name], DOB 1950/11/27, MRN [id number]  PCP:  Forrest Moron, MD  Cardiologist:  Dulce Sellar Primary Electrophysiologist:  Ellen Goris Jorja Loa, MD    No chief complaint on file.    History of Present Illness: Diane Valentine is a 69 y.o. female who is being seen today for the evaluation of pacemaker at the request of Tobb, Kardie, DO. Presenting today for electrophysiology evaluation.  She has a history significant for paroxysmal atrial fibrillation, sick sinus syndrome status post [facility] pacemaker, coronary artery disease status post CABG, hypertension, renal artery stenosis, and hyperlipidemia. She had a cryoablation in 2019 for atrial fibrillation and has recently had a repeat ablation 03/22/2019. Unfortunately, she has gone back into atrial fibrillation.   Today, denies symptoms of palpitations, chest pain, shortness of breath, orthopnea, PND, lower extremity edema, claudication, dizziness, presyncope, syncope, bleeding, or neurologic sequela. The patient is tolerating medications without difficulties.  Since last being seen, she is unfortunately gone into atrial flutter.  Review of the ECG pacemaker interrogation shows this is an atypical atrial flutter.  This is been going on for the last 3 weeks which correlate with her symptoms of weakness, fatigue, and shortness of breath.  Past Medical History:  Diagnosis Date  . CAD in native artery 01/02/2019  . Carotid stenosis, bilateral 01/02/2019  . Essential hypertension 01/02/2019  . History of amiodarone therapy 01/02/2019  . Hx of CABG 01/02/2019  . Hyperlipidemia 01/02/2019  . PAF (paroxysmal atrial fibrillation) (HCC) 01/02/2019  . Renal artery stenosis (HCC) 01/02/2019  . Sick sinus syndrome (HCC) 01/02/2019   Past Surgical History:  Procedure Laterality Date  . ATRIAL FIBRILLATION ABLATION N/A 03/22/2019   Procedure: ATRIAL FIBRILLATION ABLATION;  Surgeon: Regan Lemming, MD;  Location: [facility] INVASIVE CV LAB;  Service: Cardiovascular;  Laterality: N/A;  . BOWEL RESECTION    . CARDIOVERSION    . CORONARY ANGIOPLASTY WITH STENT PLACEMENT    . CORONARY ARTERY BYPASS GRAFT  2016   x 3 vessels  . CRYOABLATION    . INSERT / REPLACE / REMOVE PACEMAKER       Current Outpatient Medications  Medication Sig Dispense Refill  . acetaminophen (TYLENOL) 325 MG tablet Take 2 tablets (650 mg total) by mouth every 4 (four) hours as needed for headache or mild pain.    Marland Kitchen albuterol (VENTOLIN HFA) 108 (90 Base) MCG/ACT inhaler Inhale 2 puffs into the lungs every 6 (six) hours as needed for wheezing or shortness of breath.     . allopurinol (ZYLOPRIM) 100 MG tablet Take 100 mg by mouth daily.    Marland Kitchen ALPRAZolam (XANAX) 0.25 MG tablet Take 0.25 mg by mouth at bedtime as needed for anxiety.    Marland Kitchen aspirin EC 81 MG tablet Take 1 tablet by mouth daily.    Marland Kitchen atorvastatin (LIPITOR) 40 MG tablet TAKE 1 TABLET EVERY DAY    . carvedilol (COREG) 12.5 MG tablet Take 1 tablet (12.5 mg total) by mouth 2 (two) times daily. 180 tablet 1  . cloNIDine (CATAPRES) 0.1 MG tablet Take 0.1 mg by mouth 2 (two) times daily.    Marland Kitchen ELIQUIS 5 MG TABS tablet TAKE 1 TABLET BY MOUTH TWICE A DAY 180 tablet 2  . furosemide (LASIX) 40 MG tablet Take 1 tablet in the morning and 0.5 tablet in the evening    . levothyroxine (SYNTHROID) 75 MCG tablet Take 75 mcg by mouth daily before breakfast.     .  losartan (COZAAR) 100 MG tablet Take 100 mg by mouth daily.    . Multiple Vitamins-Minerals (HAIR/SKIN/NAILS/BIOTIN PO) Take by mouth.    . nitroGLYCERIN (NITROSTAT) 0.4 MG SL tablet Place 0.4 mg under the tongue every 5 (five) minutes x 3 doses as needed.    . potassium chloride (KLOR-CON) 10 MEQ tablet Take 10 mEq by mouth every other day.    . pramipexole (MIRAPEX) 0.25 MG tablet Take 0.25 mg by mouth every evening.     . sotalol (BETAPACE) 80 MG tablet Take 1 tablet (80 mg total) by mouth 2 (two) times daily. 180  tablet 3  . vitamin B-12 (CYANOCOBALAMIN) 1000 MCG tablet Take 1,000 mcg by mouth daily.     No current facility-administered medications for this visit.    Allergies:   Tizanidine, Ace inhibitors, Hydrocodone-homatropine, and Rofecoxib   Social History:  The patient  reports that she quit smoking about 12 years ago. Her smoking use included cigarettes. She has a 33.75 pack-year smoking history. She has never used smokeless tobacco. She reports current alcohol use of about 2.0 standard drinks of alcohol per week. She reports that she does not use drugs.   Family History:  The patient's family history includes Heart attack (age of onset: 20) in her father; Hypertension in her daughter, mother, sister, sister, and son.   ROS:  Please see the history of present illness.   Otherwise, review of systems is positive for none.   All other systems are reviewed and negative.   PHYSICAL EXAM: VS:  BP (!) 146/92   Pulse 76   Ht 5\' 6"  (1.676 m)   Wt 191 lb (86.6 kg)   SpO2 93%   BMI 30.83 kg/m  , BMI Body mass index is 30.83 kg/m. GEN: Well nourished, well developed, in no acute distress  HEENT: normal  Neck: no JVD, carotid bruits, or masses Cardiac: RRR; no murmurs, rubs, or gallops,no edema  Respiratory:  clear to auscultation bilaterally, normal work of breathing GI: soft, nontender, nondistended, + BS MS: no deformity or atrophy  Skin: warm and dry, device site well healed Neuro:  Strength and sensation are intact Psych: euthymic mood, full affect  EKG:  EKG is not ordered today. Personal review of the ekg ordered 02/22/20 shows AF, V paced  Personal review of the device interrogation today. Results in [facility]    Recent Labs: 03/08/2019: Hemoglobin 13.9; Platelets 159 10/19/2019: BUN 26; Creatinine, Ser 1.60; Magnesium 2.2; Potassium 5.0; Sodium 129    Lipid Panel     Component Value Date/Time   CHOL 167 01/03/2019 1713   TRIG 133 01/03/2019 1713   HDL 61 01/03/2019 1713    CHOLHDL 2.7 01/03/2019 1713   LDLCALC 79 01/03/2019 1713     Wt Readings from Last 3 Encounters:  02/28/20 191 lb (86.6 kg)  02/22/20 190 lb 6.4 oz (86.4 kg)  01/09/20 187 lb (84.8 kg)      Other studies Reviewed: Additional studies/ records that were reviewed today include: TTE 01/10/19  Review of the above records today demonstrates:   1. Mild hypokinesis of the left ventricular, basal-mid inferoseptal wall.  2. The left ventricle has low normal systolic function, with an ejection fraction of 50-55%. The cavity size was normal. Left ventricular diastolic Doppler parameters are consistent with pseudonormalization.  3. The right ventricle has normal systolic function. The cavity was normal. There is no increase in right ventricular wall thickness.  4. Left atrial size was mildly dilated.  5.  Mild thickening of the aortic valve. Mild calcification of the aortic valve. Aortic valve regurgitation was not assessed by color flow Doppler.  6. The aorta is normal in size and structure.   ASSESSMENT AND PLAN:  1. Sick sinus syndrome: Status post [employer] dual-chamber pacemaker. Device functioning appropriately. No changes at this time.   2. Paroxysmal atrial fibrillation: Status post cryoablation and now repeat ablation 03/22/2019. CHA2DS2-VASc of 4. Currently on sotalol and Eliquis.  She is in what appears to be an atypical atrial flutter today.  She would prefer to hold off on cardioversion and try ablation.  Risks and benefits have been discussed include bleeding, tamponade, heart block, stroke, damage to chest organs.  She understands these risks and has agreed to the procedure.  3. Hypertension: Mildly elevated today.  This may be improved when she resumes sinus rhythm.  No changes.  4. Hyperlipidemia: Continue statin per primary cardiology  Case discussed with primary cardiology  Current medicines are reviewed at length with the patient today.   The patient does not have concerns  regarding her medicines.  The following changes were made today: None  Labs/ tests ordered today include:  No orders of the defined types were placed in this encounter.   Disposition:   FU with Gee Habig 3 months  Signed, Amila Callies Jorja Loa, MD  02/28/2020 2:32 PM     [facility] [address] [place] [address] [phone] (office) [phone] (fax)

## 2020-03-07 ENCOUNTER — Ambulatory Visit (INDEPENDENT_AMBULATORY_CARE_PROVIDER_SITE_OTHER): Payer: Medicare Other | Admitting: *Deleted

## 2020-03-07 DIAGNOSIS — I495 Sick sinus syndrome: Secondary | ICD-10-CM

## 2020-03-07 LAB — CUP PACEART REMOTE DEVICE CHECK
Battery Remaining Longevity: 122 mo
Battery Remaining Percentage: 95.5 %
Battery Voltage: 3.01 V
Brady Statistic AP VP Percent: 0 %
Brady Statistic AP VS Percent: 0 %
Brady Statistic AS VP Percent: 68 %
Brady Statistic AS VS Percent: 14 %
Brady Statistic RA Percent Paced: 0 %
Brady Statistic RV Percent Paced: 61 %
Date Time Interrogation Session: 20210922020023
Implantable Lead Implant Date: 20190909
Implantable Lead Implant Date: 20190909
Implantable Lead Location: 753859
Implantable Lead Location: 753860
Implantable Pulse Generator Implant Date: 20190909
Lead Channel Impedance Value: 450 Ohm
Lead Channel Impedance Value: 540 Ohm
Lead Channel Pacing Threshold Amplitude: 0.875 V
Lead Channel Pacing Threshold Amplitude: 0.875 V
Lead Channel Pacing Threshold Pulse Width: 0.4 ms
Lead Channel Pacing Threshold Pulse Width: 0.4 ms
Lead Channel Sensing Intrinsic Amplitude: 1.3 mV
Lead Channel Sensing Intrinsic Amplitude: 12 mV
Lead Channel Setting Pacing Amplitude: 1.125
Lead Channel Setting Pacing Amplitude: 1.875
Lead Channel Setting Pacing Pulse Width: 0.4 ms
Lead Channel Setting Sensing Sensitivity: 2 mV
Pulse Gen Model: 2272
Pulse Gen Serial Number: 9053085

## 2020-03-09 NOTE — Progress Notes (Signed)
Remote pacemaker transmission.   

## 2020-03-26 DIAGNOSIS — Z01812 Encounter for preprocedural laboratory examination: Secondary | ICD-10-CM

## 2020-03-26 DIAGNOSIS — I4819 Other persistent atrial fibrillation: Secondary | ICD-10-CM

## 2020-03-28 ENCOUNTER — Telehealth: Payer: Self-pay | Admitting: *Deleted

## 2020-03-28 ENCOUNTER — Other Ambulatory Visit: Payer: Self-pay | Admitting: Cardiology

## 2020-03-28 NOTE — Telephone Encounter (Signed)
Reviewed procedure instructions w/ pt, sent via mychart. Patient verbalized understanding and agreeable to plan.

## 2020-03-28 NOTE — Telephone Encounter (Signed)
  Patient Consent for Virtual Visit         Diane Valentine has provided verbal consent on 03/28/2020 for a virtual visit (video or telephone).   CONSENT FOR VIRTUAL VISIT FOR:  Diane Valentine  By participating in this virtual visit I agree to the following:  I hereby voluntarily request, consent and authorize [facility] and its employed or contracted physicians, physician assistants, nurse practitioners or other licensed health care professionals (the Practitioner), to provide me with telemedicine health care services (the "Services") as deemed necessary by the treating Practitioner. I acknowledge and consent to receive the Services by the Practitioner via telemedicine. I understand that the telemedicine visit will involve communicating with the Practitioner through live audiovisual communication technology and the disclosure of certain medical information by electronic transmission. I acknowledge that I have been given the opportunity to request an in-person assessment or other available alternative prior to the telemedicine visit and am voluntarily participating in the telemedicine visit.  I understand that I have the right to withhold or withdraw my consent to the use of telemedicine in the course of my care at any time, without affecting my right to future care or treatment, and that the Practitioner or I may terminate the telemedicine visit at any time. I understand that I have the right to inspect all information obtained and/or recorded in the course of the telemedicine visit and may receive copies of available information for a reasonable fee.  I understand that some of the potential risks of receiving the Services via telemedicine include:  Marland Kitchen Delay or interruption in medical evaluation due to technological equipment failure or disruption; . Information transmitted may not be sufficient (e.g. poor resolution of images) to allow for appropriate medical decision making by the Practitioner;  and/or  . In rare instances, security protocols could fail, causing a breach of personal health information.  Furthermore, I acknowledge that it is my responsibility to provide information about my medical history, conditions and care that is complete and accurate to the best of my ability. I acknowledge that Practitioner's advice, recommendations, and/or decision may be based on factors not within their control, such as incomplete or inaccurate data provided by me or distortions of diagnostic images or specimens that may result from electronic transmissions. I understand that the practice of medicine is not an exact science and that Practitioner makes no warranties or guarantees regarding treatment outcomes. I acknowledge that a copy of this consent can be made available to me via my patient portal [facility] MyChart), or I can request a printed copy by calling the office of [facility].    I understand that my insurance will be billed for this visit.   I have read or had this consent read to me. . I understand the contents of this consent, which adequately explains the benefits and risks of the Services being provided via telemedicine.  . I have been provided ample opportunity to ask questions regarding this consent and the Services and have had my questions answered to my satisfaction. . I give my informed consent for the services to be provided through the use of telemedicine in my medical care

## 2020-03-29 ENCOUNTER — Other Ambulatory Visit: Payer: Self-pay

## 2020-03-29 ENCOUNTER — Telehealth (HOSPITAL_COMMUNITY): Payer: Self-pay | Admitting: *Deleted

## 2020-03-29 ENCOUNTER — Encounter: Payer: Self-pay | Admitting: Cardiology

## 2020-03-29 ENCOUNTER — Telehealth (INDEPENDENT_AMBULATORY_CARE_PROVIDER_SITE_OTHER): Payer: Medicare Other | Admitting: Cardiology

## 2020-03-29 DIAGNOSIS — I4819 Other persistent atrial fibrillation: Secondary | ICD-10-CM | POA: Diagnosis not present

## 2020-03-29 DIAGNOSIS — I2510 Atherosclerotic heart disease of native coronary artery without angina pectoris: Secondary | ICD-10-CM | POA: Diagnosis not present

## 2020-03-29 LAB — BASIC METABOLIC PANEL
BUN/Creatinine Ratio: 21 (ref 12–28)
BUN: 29 mg/dL — ABNORMAL HIGH (ref 8–27)
CO2: 27 mmol/L (ref 20–29)
Calcium: 10.8 mg/dL — ABNORMAL HIGH (ref 8.7–10.3)
Chloride: 97 mmol/L (ref 96–106)
Creatinine, Ser: 1.38 mg/dL — ABNORMAL HIGH (ref 0.57–1.00)
GFR calc Af Amer: 45 mL/min/{1.73_m2} — ABNORMAL LOW (ref >59)
GFR calc non Af Amer: 39 mL/min/{1.73_m2} — ABNORMAL LOW (ref >59)
Glucose: 107 mg/dL — ABNORMAL HIGH (ref 65–99)
Potassium: 4.4 mmol/L (ref 3.5–5.2)
Sodium: 139 mmol/L (ref 134–144)

## 2020-03-29 LAB — CBC
Hematocrit: 41.4 % (ref 34.0–46.6)
Hemoglobin: 14.1 g/dL (ref 11.1–15.9)
MCH: 32.3 pg (ref 26.6–33.0)
MCHC: 34.1 g/dL (ref 31.5–35.7)
MCV: 95 fL (ref 79–97)
Platelets: 134 10*3/uL — ABNORMAL LOW (ref 150–450)
RBC: 4.36 x10E6/uL (ref 3.77–5.28)
RDW: 13.2 % (ref 11.7–15.4)
WBC: 5.2 10*3/uL (ref 3.4–10.8)

## 2020-03-29 NOTE — H&P (View-Only) (Signed)
Electrophysiology TeleHealth Note   Due to national recommendations of social distancing due to COVID 19, an audio/video telehealth visit is felt to be most appropriate for this patient at this time.  See Epic message for the patient's consent to telehealth for [facility].   Date:  03/29/2020   ID:  Diane Valentine, DOB December 18, 1950, MRN [id number]  Location: patient's home  Provider location: [address] [place]  Evaluation Performed: Follow-up visit  PCP:  Forrest Moron, MD  Cardiologist:  Thomasene Ripple, DO  Electrophysiologist:  Dr Elberta Fortis  Chief Complaint:  AF  History of Present Illness:    Diane Valentine is a 69 y.o. female who presents via audio/video conferencing for a telehealth visit today.  Since last being seen in our clinic, the patient reports doing very well.  Today, she denies symptoms of palpitations, chest pain, shortness of breath,  lower extremity edema, dizziness, presyncope, or syncope.  The patient is otherwise without complaint today.  The patient denies symptoms of fevers, chills, cough, or new SOB worrisome for COVID 19.  She has a history of paroxysmal atrial fibrillation, sick sinus syndrome status post [facility] pacemaker, coronary artery disease status post CABG, hypertension, renal artery stenosis, and hyperlipidemia.  She had AF ablation with cryoablation in 2019 and a repeat ablation 03/22/2019.  Unfortunately she has gone back into atrial fibrillation.  Today, denies symptoms of palpitations, chest pain, shortness of breath, orthopnea, PND, lower extremity edema, claudication, dizziness, presyncope, syncope, bleeding, or neurologic sequela. The patient is tolerating medications without difficulties.    Past Medical History:  Diagnosis Date  . CAD in native artery 01/02/2019  . Carotid stenosis, bilateral 01/02/2019  . Essential hypertension 01/02/2019  . History of amiodarone therapy 01/02/2019  . Hx of CABG 01/02/2019  .  Hyperlipidemia 01/02/2019  . PAF (paroxysmal atrial fibrillation) (HCC) 01/02/2019  . Renal artery stenosis (HCC) 01/02/2019  . Sick sinus syndrome (HCC) 01/02/2019    Past Surgical History:  Procedure Laterality Date  . ATRIAL FIBRILLATION ABLATION N/A 03/22/2019   Procedure: ATRIAL FIBRILLATION ABLATION;  Surgeon: Regan Lemming, MD;  Location: [facility];  Service: Cardiovascular;  Laterality: N/A;  . BOWEL RESECTION    . CARDIOVERSION    . CORONARY ANGIOPLASTY WITH STENT PLACEMENT    . CORONARY ARTERY BYPASS GRAFT  2016   x 3 vessels  . CRYOABLATION    . INSERT / REPLACE / REMOVE PACEMAKER      Current Outpatient Medications  Medication Sig Dispense Refill  . acetaminophen (TYLENOL) 500 MG tablet Take 1,000 mg by mouth every 6 (six) hours as needed for moderate pain or headache.    . albuterol (VENTOLIN HFA) 108 (90 Base) MCG/ACT inhaler Inhale 2 puffs into the lungs every 6 (six) hours as needed for wheezing or shortness of breath.     . allopurinol (ZYLOPRIM) 100 MG tablet Take 100 mg by mouth daily.    Marland Kitchen ALPRAZolam (XANAX) 0.25 MG tablet Take 0.125-0.25 mg by mouth at bedtime.     Marland Kitchen aspirin EC 81 MG tablet Take 81 mg by mouth daily.     Marland Kitchen atorvastatin (LIPITOR) 40 MG tablet Take 40 mg by mouth daily.     . Biotin 5000 MCG CAPS Take 5,000 mcg by mouth daily.    . [place]well[place]-Glucosamine-Vit D (OSTEO BI-FLEX ONE PER DAY PO) Take 1 tablet by mouth daily.    . carvedilol (COREG) 12.5 MG tablet Take 1 tablet (12.5 mg  total) by mouth 2 (two) times daily. 180 tablet 1  . cloNIDine (CATAPRES) 0.1 MG tablet Take 0.1 mg by mouth 2 (two) times daily.    Marland Kitchen ELIQUIS 5 MG TABS tablet TAKE 1 TABLET BY MOUTH TWICE A DAY (Patient taking differently: Take 5 mg by mouth 2 (two) times daily. ) 180 tablet 2  . furosemide (LASIX) 40 MG tablet Take 20-40 mg by mouth See admin instructions. Take 40 mg in the morning and 20 mg in the evening    . levothyroxine (SYNTHROID) 75 MCG tablet Take 75  mcg by mouth daily before breakfast.     . losartan (COZAAR) 100 MG tablet Take 100 mg by mouth daily.    . nitroGLYCERIN (NITROSTAT) 0.4 MG SL tablet Place 0.4 mg under the tongue every 5 (five) minutes x 3 doses as needed for chest pain.     . potassium chloride (KLOR-CON) 10 MEQ tablet Take 10 mEq by mouth every other day.    . pramipexole (MIRAPEX) 0.25 MG tablet Take 0.25 mg by mouth every evening.     . saline (AYR) GEL Place 1 application into the nose daily as needed (dryness).    . sotalol (BETAPACE) 80 MG tablet Take 1 tablet (80 mg total) by mouth 2 (two) times daily. (Patient not taking: Reported on 03/28/2020) 180 tablet 3  . SOTALOL AF 80 MG TABS Take 80 mg by mouth 2 (two) times daily.     . vitamin B-12 (CYANOCOBALAMIN) 1000 MCG tablet Take 1,000 mcg by mouth daily.     No current facility-administered medications for this visit.    Allergies:   Tizanidine, Ace inhibitors, Hydrocodone-homatropine, and Rofecoxib   Social History:  The patient  reports that she quit smoking about 12 years ago. Her smoking use included cigarettes. She has a 33.75 pack-year smoking history. She has never used smokeless tobacco. She reports current alcohol use of about 2.0 standard drinks of alcohol per week. She reports that she does not use drugs.   Family History:  The patient's  family history includes Heart attack (age of onset: 75) in her father; Hypertension in her daughter, mother, sister, sister, and son.   ROS:  Please see the history of present illness.   All other systems are personally reviewed and negative.    Exam:    Vital Signs:  There were no vitals taken for this visit.  no acute distress, no shortness of breath.  Labs/Other Tests and Data Reviewed:    Recent Labs: 10/19/2019: BUN 26; Creatinine, Ser 1.60; Magnesium 2.2; Potassium 5.0; Sodium 129   Wt Readings from Last 3 Encounters:  02/28/20 191 lb (86.6 kg)  02/22/20 190 lb 6.4 oz (86.4 kg)  01/09/20 187 lb (84.8 kg)      Other studies personally reviewed: Additional studies/ records that were reviewed today include: ECG 02/22/20  Review of the above records today demonstrates:  V paced,  AF  Last device remote is reviewed from PaceART PDF dated 03/07/20 which reveals normal device function, no arrhythmias    ASSESSMENT & PLAN:    1.  Paroxysmal atrial fibrillation: Status post ablation x2.  CHA2DS2-VASc of 4.  On sotalol and Eliquis.  Came back in clinic with an atypical atrial flutter.  At this point she would like a repeat ablation.  Risk and benefits were discussed include bleeding, tamponade, heart block, stroke, damage to chest organs.  She understands risks and is agreed to the procedure.  2.  Sick sinus syndrome:  Status post [facility] dual-chamber pacemaker.  Device functioning appropriately.  No changes at this time.  3.  Hypertension:not checked today but usually well controlled   COVID 19 screen The patient denies symptoms of COVID 19 at this time.  The importance of social distancing was discussed today.  Follow-up:  3 months   Current medicines are reviewed at length with the patient today.   The patient does not have concerns regarding her medicines.  The following changes were made today:  none  Labs/ tests ordered today include:  No orders of the defined types were placed in this encounter.    Patient Risk:  after full review of this patients clinical status, I feel that they are at moderate risk at this time.  Today, I have spent 10 minutes with the patient with telehealth technology discussing AF .    Signed, Anayelli Lai Jorja Loa, MD  03/29/2020 11:30 AM     [facility] [address] [place] [address] [phone] (office) [phone] (fax)

## 2020-03-29 NOTE — Telephone Encounter (Signed)
Reaching out to patient to offer assistance regarding upcoming cardiac imaging study; pt verbalizes understanding of appt date/time, parking situation and where to check in, pre-test NPO status and medications ordered, and verified current allergies; name and call back number provided for further questions should they arise  [place] [phone] office [phone] cell  Pt verbalized understanding to go to Admitting to get checked in for the infusion center.

## 2020-03-29 NOTE — Progress Notes (Signed)
Electrophysiology TeleHealth Note   Due to national recommendations of social distancing due to COVID 19, an audio/video telehealth visit is felt to be most appropriate for this patient at this time.  See Epic message for the patient's consent to telehealth for [facility].   Date:  03/29/2020   ID:  Diane Valentine, DOB December 18, 1950, MRN [id number]  Location: patient's home  Provider location: [address] [place]  Evaluation Performed: Follow-up visit  PCP:  Forrest Moron, MD  Cardiologist:  Thomasene Ripple, DO  Electrophysiologist:  Dr Elberta Fortis  Chief Complaint:  AF  History of Present Illness:    Diane Valentine is a 69 y.o. female who presents via audio/video conferencing for a telehealth visit today.  Since last being seen in our clinic, the patient reports doing very well.  Today, she denies symptoms of palpitations, chest pain, shortness of breath,  lower extremity edema, dizziness, presyncope, or syncope.  The patient is otherwise without complaint today.  The patient denies symptoms of fevers, chills, cough, or new SOB worrisome for COVID 19.  She has a history of paroxysmal atrial fibrillation, sick sinus syndrome status post [facility] pacemaker, coronary artery disease status post CABG, hypertension, renal artery stenosis, and hyperlipidemia.  She had AF ablation with cryoablation in 2019 and a repeat ablation 03/22/2019.  Unfortunately she has gone back into atrial fibrillation.  Today, denies symptoms of palpitations, chest pain, shortness of breath, orthopnea, PND, lower extremity edema, claudication, dizziness, presyncope, syncope, bleeding, or neurologic sequela. The patient is tolerating medications without difficulties.    Past Medical History:  Diagnosis Date  . CAD in native artery 01/02/2019  . Carotid stenosis, bilateral 01/02/2019  . Essential hypertension 01/02/2019  . History of amiodarone therapy 01/02/2019  . Hx of CABG 01/02/2019  .  Hyperlipidemia 01/02/2019  . PAF (paroxysmal atrial fibrillation) (HCC) 01/02/2019  . Renal artery stenosis (HCC) 01/02/2019  . Sick sinus syndrome (HCC) 01/02/2019    Past Surgical History:  Procedure Laterality Date  . ATRIAL FIBRILLATION ABLATION N/A 03/22/2019   Procedure: ATRIAL FIBRILLATION ABLATION;  Surgeon: Regan Lemming, MD;  Location: [facility];  Service: Cardiovascular;  Laterality: N/A;  . BOWEL RESECTION    . CARDIOVERSION    . CORONARY ANGIOPLASTY WITH STENT PLACEMENT    . CORONARY ARTERY BYPASS GRAFT  2016   x 3 vessels  . CRYOABLATION    . INSERT / REPLACE / REMOVE PACEMAKER      Current Outpatient Medications  Medication Sig Dispense Refill  . acetaminophen (TYLENOL) 500 MG tablet Take 1,000 mg by mouth every 6 (six) hours as needed for moderate pain or headache.    . albuterol (VENTOLIN HFA) 108 (90 Base) MCG/ACT inhaler Inhale 2 puffs into the lungs every 6 (six) hours as needed for wheezing or shortness of breath.     . allopurinol (ZYLOPRIM) 100 MG tablet Take 100 mg by mouth daily.    Marland Kitchen ALPRAZolam (XANAX) 0.25 MG tablet Take 0.125-0.25 mg by mouth at bedtime.     Marland Kitchen aspirin EC 81 MG tablet Take 81 mg by mouth daily.     Marland Kitchen atorvastatin (LIPITOR) 40 MG tablet Take 40 mg by mouth daily.     . Biotin 5000 MCG CAPS Take 5,000 mcg by mouth daily.    . Boswellia-Glucosamine-Vit D (OSTEO BI-FLEX ONE PER DAY PO) Take 1 tablet by mouth daily.    . carvedilol (COREG) 12.5 MG tablet Take 1 tablet (12.5 mg  total) by mouth 2 (two) times daily. 180 tablet 1  . cloNIDine (CATAPRES) 0.1 MG tablet Take 0.1 mg by mouth 2 (two) times daily.    Marland Kitchen ELIQUIS 5 MG TABS tablet TAKE 1 TABLET BY MOUTH TWICE A DAY (Patient taking differently: Take 5 mg by mouth 2 (two) times daily. ) 180 tablet 2  . furosemide (LASIX) 40 MG tablet Take 20-40 mg by mouth See admin instructions. Take 40 mg in the morning and 20 mg in the evening    . levothyroxine (SYNTHROID) 75 MCG tablet Take 75  mcg by mouth daily before breakfast.     . losartan (COZAAR) 100 MG tablet Take 100 mg by mouth daily.    . nitroGLYCERIN (NITROSTAT) 0.4 MG SL tablet Place 0.4 mg under the tongue every 5 (five) minutes x 3 doses as needed for chest pain.     . potassium chloride (KLOR-CON) 10 MEQ tablet Take 10 mEq by mouth every other day.    . pramipexole (MIRAPEX) 0.25 MG tablet Take 0.25 mg by mouth every evening.     . saline (AYR) GEL Place 1 application into the nose daily as needed (dryness).    . sotalol (BETAPACE) 80 MG tablet Take 1 tablet (80 mg total) by mouth 2 (two) times daily. (Patient not taking: Reported on 03/28/2020) 180 tablet 3  . SOTALOL AF 80 MG TABS Take 80 mg by mouth 2 (two) times daily.     . vitamin B-12 (CYANOCOBALAMIN) 1000 MCG tablet Take 1,000 mcg by mouth daily.     No current facility-administered medications for this visit.    Allergies:   Tizanidine, Ace inhibitors, Hydrocodone-homatropine, and Rofecoxib   Social History:  The patient  reports that she quit smoking about 12 years ago. Her smoking use included cigarettes. She has a 33.75 pack-year smoking history. She has never used smokeless tobacco. She reports current alcohol use of about 2.0 standard drinks of alcohol per week. She reports that she does not use drugs.   Family History:  The patient's  family history includes Heart attack (age of onset: 75) in her father; Hypertension in her daughter, mother, sister, sister, and son.   ROS:  Please see the history of present illness.   All other systems are personally reviewed and negative.    Exam:    Vital Signs:  There were no vitals taken for this visit.  no acute distress, no shortness of breath.  Labs/Other Tests and Data Reviewed:    Recent Labs: 10/19/2019: BUN 26; Creatinine, Ser 1.60; Magnesium 2.2; Potassium 5.0; Sodium 129   Wt Readings from Last 3 Encounters:  02/28/20 191 lb (86.6 kg)  02/22/20 190 lb 6.4 oz (86.4 kg)  01/09/20 187 lb (84.8 kg)      Other studies personally reviewed: Additional studies/ records that were reviewed today include: ECG 02/22/20  Review of the above records today demonstrates:  V paced,  AF  Last device remote is reviewed from PaceART PDF dated 03/07/20 which reveals normal device function, no arrhythmias    ASSESSMENT & PLAN:    1.  Paroxysmal atrial fibrillation: Status post ablation x2.  CHA2DS2-VASc of 4.  On sotalol and Eliquis.  Came back in clinic with an atypical atrial flutter.  At this point she would like a repeat ablation.  Risk and benefits were discussed include bleeding, tamponade, heart block, stroke, damage to chest organs.  She understands risks and is agreed to the procedure.  2.  Sick sinus syndrome:  Status post [facility] dual-chamber pacemaker.  Device functioning appropriately.  No changes at this time.  3.  Hypertension:not checked today but usually well controlled   COVID 19 screen The patient denies symptoms of COVID 19 at this time.  The importance of social distancing was discussed today.  Follow-up:  3 months   Current medicines are reviewed at length with the patient today.   The patient does not have concerns regarding her medicines.  The following changes were made today:  none  Labs/ tests ordered today include:  No orders of the defined types were placed in this encounter.    Patient Risk:  after full review of this patients clinical status, I feel that they are at moderate risk at this time.  Today, I have spent 10 minutes with the patient with telehealth technology discussing AF .    Signed, Paitlyn Mcclatchey Jorja Loa, MD  03/29/2020 11:30 AM     [facility] [address] [place] [address] [phone] (office) [phone] (fax)

## 2020-03-30 ENCOUNTER — Ambulatory Visit (HOSPITAL_COMMUNITY)
Admission: RE | Admit: 2020-03-30 | Discharge: 2020-03-30 | Disposition: A | Discharge status: Home / Self Care | Payer: Medicare Other | Source: Ambulatory Visit | Attending: Cardiology | Admitting: Cardiology

## 2020-03-30 ENCOUNTER — Telehealth: Payer: Self-pay | Admitting: Cardiology

## 2020-03-30 DIAGNOSIS — I4819 Other persistent atrial fibrillation: Secondary | ICD-10-CM

## 2020-03-30 LAB — BASIC METABOLIC PANEL
Anion gap: 9 (ref 5–15)
BUN: 28 mg/dL — ABNORMAL HIGH (ref 8–23)
CO2: 28 mmol/L (ref 22–32)
Calcium: 10.6 mg/dL — ABNORMAL HIGH (ref 8.9–10.3)
Chloride: 95 mmol/L — ABNORMAL LOW (ref 98–111)
Creatinine, Ser: 1.45 mg/dL — ABNORMAL HIGH (ref 0.44–1.00)
GFR, Estimated: 36 mL/min — ABNORMAL LOW (ref >60)
Glucose, Bld: 132 mg/dL — ABNORMAL HIGH (ref 70–99)
Potassium: 4.2 mmol/L (ref 3.5–5.1)
Sodium: 132 mmol/L — ABNORMAL LOW (ref 135–145)

## 2020-03-30 MED ORDER — SODIUM CHLORIDE 0.9 % WEIGHT BASED INFUSION
3.0000 mL/kg/h | INTRAVENOUS | Status: AC
  Administered 2020-03-30: 3 mL/kg/h via INTRAVENOUS

## 2020-03-30 MED ORDER — SODIUM CHLORIDE 0.9 % WEIGHT BASED INFUSION: 1.0000 mL/kg/h | INTRAVENOUS | Status: DC

## 2020-03-30 MED ORDER — IOHEXOL 350 MG/ML SOLN
80.0000 mL | Freq: Once | INTRAVENOUS | Status: AC | PRN
  Administered 2020-03-30: 80 mL via INTRAVENOUS

## 2020-03-30 NOTE — Telephone Encounter (Signed)
Spoke with French Ana from [facility] who is calling urgent report for CT ordered by Dr Elberta Fortis.  CT shows Small volume perihepatic and perisplenic ascites, of indeterminate significance.  See report for complete details.  Will forward information to Dr Elberta Fortis and his RN.

## 2020-03-30 NOTE — Telephone Encounter (Signed)
Kennith Center from [facility] calling to give CT results

## 2020-04-02 ENCOUNTER — Other Ambulatory Visit (HOSPITAL_COMMUNITY)
Admission: RE | Admit: 2020-04-02 | Discharge: 2020-04-02 | Disposition: A | Discharge status: Home / Self Care | Payer: Medicare Other | Source: Ambulatory Visit | Attending: Cardiology | Admitting: Cardiology

## 2020-04-02 DIAGNOSIS — Z20822 Contact with and (suspected) exposure to covid-19: Secondary | ICD-10-CM | POA: Diagnosis not present

## 2020-04-02 DIAGNOSIS — Z01812 Encounter for preprocedural laboratory examination: Secondary | ICD-10-CM | POA: Diagnosis present

## 2020-04-02 LAB — SARS CORONAVIRUS 2 (TAT 6-24 HRS): SARS Coronavirus 2: NEGATIVE

## 2020-04-03 NOTE — Progress Notes (Signed)
Instructed patient on the following items: Arrival time 0630 Nothing to eat or drink after midnight No meds AM of procedure Responsible person to drive you home and stay with you for 24 hrs  Have you missed any doses of anti-coagulant on Eliquis-hasn't missed any doses

## 2020-04-04 ENCOUNTER — Ambulatory Visit (HOSPITAL_COMMUNITY): Payer: Medicare Other | Admitting: Anesthesiology

## 2020-04-04 ENCOUNTER — Ambulatory Visit (HOSPITAL_COMMUNITY)
Admission: RE | Admit: 2020-04-04 | Discharge: 2020-04-04 | Disposition: A | Discharge status: Home / Self Care | Payer: Medicare Other | Attending: Cardiology | Admitting: Cardiology

## 2020-04-04 ENCOUNTER — Other Ambulatory Visit: Payer: Self-pay

## 2020-04-04 ENCOUNTER — Encounter (HOSPITAL_COMMUNITY): Payer: Self-pay | Admitting: Cardiology

## 2020-04-04 ENCOUNTER — Ambulatory Visit (HOSPITAL_COMMUNITY): Admission: RE | Admit: 2020-04-04 | Payer: Medicare Other | Source: Home / Self Care | Admitting: Cardiology

## 2020-04-04 ENCOUNTER — Encounter (HOSPITAL_COMMUNITY)
Admission: RE | Disposition: A | Discharge status: Home / Self Care | Payer: Self-pay | Source: Home / Self Care | Attending: Cardiology

## 2020-04-04 DIAGNOSIS — Z950 Presence of cardiac pacemaker: Secondary | ICD-10-CM | POA: Diagnosis not present

## 2020-04-04 DIAGNOSIS — Z951 Presence of aortocoronary bypass graft: Secondary | ICD-10-CM | POA: Diagnosis not present

## 2020-04-04 DIAGNOSIS — Z7982 Long term (current) use of aspirin: Secondary | ICD-10-CM | POA: Insufficient documentation

## 2020-04-04 DIAGNOSIS — Z885 Allergy status to narcotic agent status: Secondary | ICD-10-CM | POA: Diagnosis not present

## 2020-04-04 DIAGNOSIS — Z79890 Hormone replacement therapy (postmenopausal): Secondary | ICD-10-CM | POA: Diagnosis not present

## 2020-04-04 DIAGNOSIS — I2510 Atherosclerotic heart disease of native coronary artery without angina pectoris: Secondary | ICD-10-CM | POA: Insufficient documentation

## 2020-04-04 DIAGNOSIS — I6523 Occlusion and stenosis of bilateral carotid arteries: Secondary | ICD-10-CM | POA: Insufficient documentation

## 2020-04-04 DIAGNOSIS — Z79899 Other long term (current) drug therapy: Secondary | ICD-10-CM | POA: Diagnosis not present

## 2020-04-04 DIAGNOSIS — I701 Atherosclerosis of renal artery: Secondary | ICD-10-CM | POA: Insufficient documentation

## 2020-04-04 DIAGNOSIS — I4819 Other persistent atrial fibrillation: Secondary | ICD-10-CM | POA: Insufficient documentation

## 2020-04-04 DIAGNOSIS — Z87891 Personal history of nicotine dependence: Secondary | ICD-10-CM | POA: Diagnosis not present

## 2020-04-04 DIAGNOSIS — I10 Essential (primary) hypertension: Secondary | ICD-10-CM | POA: Diagnosis not present

## 2020-04-04 DIAGNOSIS — E785 Hyperlipidemia, unspecified: Secondary | ICD-10-CM | POA: Insufficient documentation

## 2020-04-04 DIAGNOSIS — Z7901 Long term (current) use of anticoagulants: Secondary | ICD-10-CM | POA: Diagnosis not present

## 2020-04-04 DIAGNOSIS — I495 Sick sinus syndrome: Secondary | ICD-10-CM | POA: Insufficient documentation

## 2020-04-04 HISTORY — PX: ATRIAL FIBRILLATION ABLATION: EP1191

## 2020-04-04 LAB — POCT ACTIVATED CLOTTING TIME: Activated Clotting Time: 318 seconds

## 2020-04-04 SURGERY — ATRIAL FIBRILLATION ABLATION
Anesthesia: General

## 2020-04-04 MED ORDER — LOSARTAN POTASSIUM 50 MG PO TABS
100.0000 mg | ORAL_TABLET | ORAL | Status: AC
  Administered 2020-04-04: 100 mg via ORAL
  Filled 2020-04-04: qty 2

## 2020-04-04 MED ORDER — ROCURONIUM BROMIDE 100 MG/10ML IV SOLN
INTRAVENOUS | Status: DC | PRN
  Administered 2020-04-04: 50 mg via INTRAVENOUS
  Administered 2020-04-04: 20 mg via INTRAVENOUS

## 2020-04-04 MED ORDER — FENTANYL CITRATE (PF) 100 MCG/2ML IJ SOLN
INTRAMUSCULAR | Status: DC | PRN
  Administered 2020-04-04 (×2): 50 ug via INTRAVENOUS

## 2020-04-04 MED ORDER — SODIUM CHLORIDE 0.9 % IV SOLN: 250.0000 mL | INTRAVENOUS | Status: DC | PRN

## 2020-04-04 MED ORDER — SODIUM CHLORIDE 0.9% FLUSH: 3.0000 mL | INTRAVENOUS | Status: DC | PRN

## 2020-04-04 MED ORDER — PROTAMINE SULFATE 10 MG/ML IV SOLN
INTRAVENOUS | Status: DC | PRN
  Administered 2020-04-04: 40 mg via INTRAVENOUS
  Administered 2020-04-04: 30 mg via INTRAVENOUS

## 2020-04-04 MED ORDER — HEPARIN (PORCINE) IN NACL 1000-0.9 UT/500ML-% IV SOLN
INTRAVENOUS | Status: AC
  Filled 2020-04-04: qty 500

## 2020-04-04 MED ORDER — SUGAMMADEX SODIUM 200 MG/2ML IV SOLN
INTRAVENOUS | Status: DC | PRN
  Administered 2020-04-04: 350 mg via INTRAVENOUS

## 2020-04-04 MED ORDER — ACETAMINOPHEN 325 MG PO TABS: 650.0000 mg | ORAL_TABLET | ORAL | Status: DC | PRN

## 2020-04-04 MED ORDER — HEPARIN (PORCINE) IN NACL 1000-0.9 UT/500ML-% IV SOLN
INTRAVENOUS | Status: DC | PRN
  Administered 2020-04-04 (×5): 500 mL

## 2020-04-04 MED ORDER — HYDRALAZINE HCL 20 MG/ML IJ SOLN
INTRAMUSCULAR | Status: DC | PRN
  Administered 2020-04-04: 5 mg via INTRAVENOUS

## 2020-04-04 MED ORDER — PROPOFOL 10 MG/ML IV BOLUS
INTRAVENOUS | Status: DC | PRN
  Administered 2020-04-04: 150 mg via INTRAVENOUS

## 2020-04-04 MED ORDER — HEPARIN SODIUM (PORCINE) 1000 UNIT/ML IJ SOLN
INTRAMUSCULAR | Status: DC | PRN
  Administered 2020-04-04: 1000 [IU] via INTRAVENOUS

## 2020-04-04 MED ORDER — LIDOCAINE 2% (20 MG/ML) 5 ML SYRINGE
INTRAMUSCULAR | Status: DC | PRN
  Administered 2020-04-04: 40 mg via INTRAVENOUS

## 2020-04-04 MED ORDER — HEPARIN SODIUM (PORCINE) 1000 UNIT/ML IJ SOLN
INTRAMUSCULAR | Status: DC | PRN
  Administered 2020-04-04: 14000 [IU] via INTRAVENOUS
  Administered 2020-04-04: 2000 [IU] via INTRAVENOUS

## 2020-04-04 MED ORDER — CEFAZOLIN SODIUM-DEXTROSE 2-4 GM/100ML-% IV SOLN
INTRAVENOUS | Status: AC
  Filled 2020-04-04: qty 100

## 2020-04-04 MED ORDER — SODIUM CHLORIDE 0.9 % IV SOLN: INTRAVENOUS | Status: DC

## 2020-04-04 MED ORDER — CEFAZOLIN SODIUM-DEXTROSE 2-3 GM-%(50ML) IV SOLR
INTRAVENOUS | Status: DC | PRN
  Administered 2020-04-04: 2 g via INTRAVENOUS

## 2020-04-04 MED ORDER — PHENYLEPHRINE 40 MCG/ML (10ML) SYRINGE FOR IV PUSH (FOR BLOOD PRESSURE SUPPORT)
PREFILLED_SYRINGE | INTRAVENOUS | Status: DC | PRN
  Administered 2020-04-04: 40 ug via INTRAVENOUS
  Administered 2020-04-04: 80 ug via INTRAVENOUS

## 2020-04-04 MED ORDER — IPRATROPIUM-ALBUTEROL 0.5-2.5 (3) MG/3ML IN SOLN
RESPIRATORY_TRACT | Status: AC
  Filled 2020-04-04: qty 3

## 2020-04-04 MED ORDER — DOBUTAMINE IN D5W 4-5 MG/ML-% IV SOLN
INTRAVENOUS | Status: DC | PRN
  Administered 2020-04-04: 20 ug/kg/min via INTRAVENOUS

## 2020-04-04 MED ORDER — DEXAMETHASONE SODIUM PHOSPHATE 10 MG/ML IJ SOLN
INTRAMUSCULAR | Status: DC | PRN
  Administered 2020-04-04: 5 mg via INTRAVENOUS

## 2020-04-04 MED ORDER — HEPARIN SODIUM (PORCINE) 1000 UNIT/ML IJ SOLN
INTRAMUSCULAR | Status: AC
  Filled 2020-04-04: qty 1

## 2020-04-04 MED ORDER — MIDAZOLAM HCL 5 MG/5ML IJ SOLN
INTRAMUSCULAR | Status: DC | PRN
  Administered 2020-04-04: 2 mg via INTRAVENOUS

## 2020-04-04 MED ORDER — ONDANSETRON HCL 4 MG/2ML IJ SOLN
INTRAMUSCULAR | Status: DC | PRN
  Administered 2020-04-04: 4 mg via INTRAVENOUS

## 2020-04-04 MED ORDER — DOBUTAMINE IN D5W 4-5 MG/ML-% IV SOLN
INTRAVENOUS | Status: AC
  Filled 2020-04-04: qty 250

## 2020-04-04 MED ORDER — ONDANSETRON HCL 4 MG/2ML IJ SOLN: 4.0000 mg | Freq: Four times a day (QID) | INTRAMUSCULAR | Status: DC | PRN

## 2020-04-04 MED ORDER — IPRATROPIUM-ALBUTEROL 0.5-2.5 (3) MG/3ML IN SOLN
3.0000 mL | Freq: Once | RESPIRATORY_TRACT | Status: AC
  Administered 2020-04-04: 3 mL via RESPIRATORY_TRACT

## 2020-04-04 SURGICAL SUPPLY — 22 items
BAG SNAP BAND KOVER 36X36 (MISCELLANEOUS) ×3 IMPLANT
BLANKET WARM UNDERBOD FULL ACC (MISCELLANEOUS) ×3 IMPLANT
CATH MAPPNG PENTARAY F 2-6-2MM (CATHETERS) ×1 IMPLANT
CATH S CIRCA THERM PROBE 10F (CATHETERS) ×3 IMPLANT
CATH SMTCH THERMOCOOL SF DF (CATHETERS) ×3 IMPLANT
CATH SOUNDSTAR ECO 8FR (CATHETERS) ×3 IMPLANT
CATH WEBSTER BI DIR CS D-F CRV (CATHETERS) ×3 IMPLANT
CLOSURE PERCLOSE PROSTYLE (VASCULAR PRODUCTS) ×12 IMPLANT
COVER DOME SNAP 22 D (MISCELLANEOUS) ×3 IMPLANT
COVER SWIFTLINK CONNECTOR (BAG) ×3 IMPLANT
KIT VERSACROSS STEERABLE D1 (CATHETERS) ×3 IMPLANT
PACK EP LATEX FREE (CUSTOM PROCEDURE TRAY) ×3
PACK EP LF (CUSTOM PROCEDURE TRAY) ×1 IMPLANT
PAD PRO RADIOLUCENT 2001M-C (PAD) ×3 IMPLANT
PATCH CARTO3 (PAD) ×3 IMPLANT
PENTARAY F 2-6-2MM (CATHETERS) ×3
SHEATH CARTO VIZIGO SM CVD (SHEATH) ×3 IMPLANT
SHEATH PINNACLE 7F 10CM (SHEATH) ×3 IMPLANT
SHEATH PINNACLE 8F 10CM (SHEATH) ×6 IMPLANT
SHEATH PINNACLE 9F 10CM (SHEATH) ×3 IMPLANT
SHEATH PROBE COVER 6X72 (BAG) ×3 IMPLANT
TUBING SMART ABLATE COOLFLOW (TUBING) ×3 IMPLANT

## 2020-04-04 NOTE — Anesthesia Preprocedure Evaluation (Signed)
Anesthesia Evaluation  Patient identified by MRN, date of birth, ID band Patient awake    Reviewed: Allergy & Precautions, H&P , NPO status   Airway Mallampati: II   Neck ROM: full    Dental   Pulmonary former smoker,    breath sounds clear to auscultation       Cardiovascular hypertension, + CAD, + CABG and + Peripheral Vascular Disease  + dysrhythmias Atrial Fibrillation  Rhythm:regular Rate:Normal     Neuro/Psych    GI/Hepatic GERD  ,  Endo/Other  Hypothyroidism   Renal/GU Renal InsufficiencyRenal disease     Musculoskeletal   Abdominal   Peds  Hematology   Anesthesia Other Findings   Reproductive/Obstetrics                             Anesthesia Physical Anesthesia Plan  ASA: III  Anesthesia Plan: General   Post-op Pain Management:    Induction: Intravenous  PONV Risk Score and Plan: 3 and Ondansetron, Dexamethasone and Treatment may vary due to age or medical condition  Airway Management Planned: Oral ETT  Additional Equipment:   Intra-op Plan:   Post-operative Plan: Extubation in OR  Informed Consent: I have reviewed the patients History and Physical, chart, labs and discussed the procedure including the risks, benefits and alternatives for the proposed anesthesia with the patient or authorized representative who has indicated his/her understanding and acceptance.       Plan Discussed with: CRNA, Anesthesiologist and Surgeon  Anesthesia Plan Comments:         Anesthesia Quick Evaluation

## 2020-04-04 NOTE — Transfer of Care (Signed)
Immediate Anesthesia Transfer of Care Note  Patient: Diane Valentine  Procedure(s) Performed: ATRIAL FIBRILLATION ABLATION (N/A )  Patient Location: PACU  Anesthesia Type:General  Level of Consciousness: awake, alert  and oriented  Airway & Oxygen Therapy: Patient Spontanous Breathing and Patient connected to face mask oxygen  Post-op Assessment: Report given to RN and Post -op Vital signs reviewed and stable  Post vital signs: Reviewed and stable  Last Vitals:  Vitals Value Taken Time  BP 131/65 04/04/20 1047  Temp    Pulse 63 04/04/20 1048  Resp 18 04/04/20 1048  SpO2 92 % 04/04/20 1048  Vitals shown include unvalidated device data.  Last Pain:  Vitals:   04/04/20 0648  TempSrc:   PainSc: 0-No pain         Complications: No complications documented.

## 2020-04-04 NOTE — Progress Notes (Signed)
BP elevated, pt has not had any BP meds, Mardelle Matte PA / card, called updated and orders followed.

## 2020-04-04 NOTE — Interval H&P Note (Signed)
History and Physical Interval Note:  Diane Valentine has presented today for surgery, with the diagnosis of atrial fibrillation.  The various methods of treatment have been discussed with the patient and family. After consideration of risks, benefits and other options for treatment, the patient has consented to  Procedure(s): Catheter ablation as a surgical intervention .  Risks include but not limited to bleeding, vascular damage, tamponade, heart block, stroke, damage to surrounding organs, among others. The patient's history has been reviewed, patient examined, no change in status, stable for surgery.  I have reviewed the patient's chart and labs.  Questions were answered to the patient's satisfaction.    Henley Blyth Elberta Fortis, MD 04/04/2020 8:08 AM

## 2020-04-04 NOTE — Anesthesia Procedure Notes (Signed)
Procedure Name: Intubation Date/Time: 04/04/2020 8:48 AM Performed by: Marny Lowenstein, CRNA Pre-anesthesia Checklist: Patient identified, Emergency Drugs available, Suction available and Patient being monitored Patient Re-evaluated:Patient Re-evaluated prior to induction Oxygen Delivery Method: Circle system utilized Preoxygenation: Pre-oxygenation with 100% oxygen Induction Type: IV induction Ventilation: Mask ventilation without difficulty Laryngoscope Size: Miller and 2 Grade View: Grade I Tube type: Oral Tube size: 7.0 mm Number of attempts: 1 Airway Equipment and Method: Patient positioned with wedge pillow and Stylet Placement Confirmation: ETT inserted through vocal cords under direct vision,  positive ETCO2 and breath sounds checked- equal and bilateral Secured at: 20 cm Tube secured with: Tape Dental Injury: Teeth and Oropharynx as per pre-operative assessment

## 2020-04-04 NOTE — Discharge Instructions (Signed)

## 2020-04-05 ENCOUNTER — Encounter (HOSPITAL_COMMUNITY): Payer: Self-pay | Admitting: Cardiology

## 2020-04-06 NOTE — Anesthesia Postprocedure Evaluation (Signed)
Anesthesia Post Note  Patient: Diane Valentine  Procedure(s) Performed: ATRIAL FIBRILLATION ABLATION (N/A )     Patient location during evaluation: PACU Anesthesia Type: General Level of consciousness: awake and alert Pain management: pain level controlled Vital Signs Assessment: post-procedure vital signs reviewed and stable Respiratory status: spontaneous breathing, nonlabored ventilation, respiratory function stable and patient connected to nasal cannula oxygen Cardiovascular status: blood pressure returned to baseline and stable Postop Assessment: no apparent nausea or vomiting Anesthetic complications: no   No complications documented.  Last Vitals:  Vitals:   04/04/20 1442 04/04/20 1446  BP: (!) 181/101 (!) 173/92  Pulse: 66 65  Resp: 16 17  Temp:    SpO2: 95% 95%    Last Pain:  Vitals:   04/05/20 0920  TempSrc:   PainSc: 0-No pain                 Sirius Woodford S

## 2020-04-19 ENCOUNTER — Other Ambulatory Visit: Payer: Self-pay | Admitting: Cardiology

## 2020-04-25 ENCOUNTER — Inpatient Hospital Stay (HOSPITAL_COMMUNITY): Admission: RE | Admit: 2020-04-25 | Payer: Medicare Other | Source: Ambulatory Visit | Admitting: Physician Assistant

## 2020-04-25 ENCOUNTER — Encounter (HOSPITAL_COMMUNITY): Payer: Self-pay

## 2020-05-02 ENCOUNTER — Other Ambulatory Visit: Payer: Self-pay

## 2020-05-02 ENCOUNTER — Encounter (HOSPITAL_COMMUNITY): Payer: Self-pay | Admitting: Physician Assistant

## 2020-05-02 ENCOUNTER — Ambulatory Visit (HOSPITAL_COMMUNITY)
Admission: RE | Admit: 2020-05-02 | Discharge: 2020-05-02 | Disposition: A | Discharge status: Home / Self Care | Payer: Medicare Other | Source: Ambulatory Visit | Attending: Physician Assistant | Admitting: Physician Assistant

## 2020-05-02 VITALS — BP 138/82 | HR 63 | Ht 66.0 in | Wt 189.6 lb

## 2020-05-02 DIAGNOSIS — Z951 Presence of aortocoronary bypass graft: Secondary | ICD-10-CM | POA: Insufficient documentation

## 2020-05-02 DIAGNOSIS — Z7901 Long term (current) use of anticoagulants: Secondary | ICD-10-CM | POA: Diagnosis not present

## 2020-05-02 DIAGNOSIS — Z8249 Family history of ischemic heart disease and other diseases of the circulatory system: Secondary | ICD-10-CM | POA: Diagnosis not present

## 2020-05-02 DIAGNOSIS — E785 Hyperlipidemia, unspecified: Secondary | ICD-10-CM | POA: Insufficient documentation

## 2020-05-02 DIAGNOSIS — I10 Essential (primary) hypertension: Secondary | ICD-10-CM | POA: Diagnosis not present

## 2020-05-02 DIAGNOSIS — E669 Obesity, unspecified: Secondary | ICD-10-CM | POA: Diagnosis not present

## 2020-05-02 DIAGNOSIS — Z950 Presence of cardiac pacemaker: Secondary | ICD-10-CM | POA: Diagnosis not present

## 2020-05-02 DIAGNOSIS — I2510 Atherosclerotic heart disease of native coronary artery without angina pectoris: Secondary | ICD-10-CM | POA: Insufficient documentation

## 2020-05-02 DIAGNOSIS — Z6830 Body mass index (BMI) 30.0-30.9, adult: Secondary | ICD-10-CM | POA: Diagnosis not present

## 2020-05-02 DIAGNOSIS — D6869 Other thrombophilia: Secondary | ICD-10-CM

## 2020-05-02 DIAGNOSIS — I495 Sick sinus syndrome: Secondary | ICD-10-CM | POA: Diagnosis not present

## 2020-05-02 DIAGNOSIS — Z7982 Long term (current) use of aspirin: Secondary | ICD-10-CM | POA: Insufficient documentation

## 2020-05-02 DIAGNOSIS — I480 Paroxysmal atrial fibrillation: Secondary | ICD-10-CM | POA: Diagnosis present

## 2020-05-02 DIAGNOSIS — Z79890 Hormone replacement therapy (postmenopausal): Secondary | ICD-10-CM | POA: Diagnosis not present

## 2020-05-02 DIAGNOSIS — Z79899 Other long term (current) drug therapy: Secondary | ICD-10-CM | POA: Insufficient documentation

## 2020-05-02 NOTE — Progress Notes (Signed)
Primary Care Physician: Forrest Moron, MD Primary Cardiologist: Dr Dulce Sellar Primary Electrophysiologist: Dr Elberta Fortis Referring Physician: Dr Lenice Pressman is a 69 y.o. female with a history of SSS s/p PPM, CAD s/p CABG, HTN, renal artery stenosis, HLD, atrial flutter and atrial fibrillation who presents for follow up in the [facility]. Patient has a a previous cryoablation in 2019 and has been maintained on sotalol. She had more episodes of afib with symptoms of weakness, fatigue, and SOB and underwent repeat ablation with Dr Elberta Fortis on 03/22/19. She is on Eliquis for a CHADS2VASC score of 4. The device clinic received an alert for an ongoing afib episode since 10/02/19.   On follow up today, patient is s/p repeat ablation with Dr Elberta Fortis on 04/04/20. Patient reports she has done well since her procedure. She denies and heart racing or palpitations. She denies CP, swallowing, or groin issues. She denies any bleeding issues on anticoagulation.   Today, she denies symptoms of palpitations, chest pain, shortness of breath, orthopnea, PND, lower extremity edema, dizziness, presyncope, syncope, snoring, daytime somnolence, bleeding, or neurologic sequela. The patient is tolerating medications without difficulties and is otherwise without complaint today.    Atrial Fibrillation Risk Factors:  she does not have symptoms or diagnosis of sleep apnea. she does not have a history of rheumatic fever.  she has a BMI of Body mass index is 30.6 kg/m.Marland Kitchen Filed Weights   05/02/20 1340  Weight: 86 kg    Family History  Problem Relation Age of Onset   Hypertension Mother    Heart attack Father 64   Hypertension Sister    Hypertension Sister    Hypertension Daughter    Hypertension Son      Atrial Fibrillation Management history:  Previous antiarrhythmic drugs: sotalol  Previous cardioversions: remotely Previous ablations: cryoablation 2019, 03/22/19  (fib and flutter), 04/04/20 CHADS2VASC score: 4 Anticoagulation history: Eliquis   Past Medical History:  Diagnosis Date   CAD in native artery 01/02/2019   Carotid stenosis, bilateral 01/02/2019   Essential hypertension 01/02/2019   History of amiodarone therapy 01/02/2019   Hx of CABG 01/02/2019   Hyperlipidemia 01/02/2019   PAF (paroxysmal atrial fibrillation) (HCC) 01/02/2019   Renal artery stenosis (HCC) 01/02/2019   Sick sinus syndrome (HCC) 01/02/2019   Past Surgical History:  Procedure Laterality Date   ATRIAL FIBRILLATION ABLATION N/A 03/22/2019   Procedure: ATRIAL FIBRILLATION ABLATION;  Surgeon: Regan Lemming, MD;  Location: [facility];  Service: Cardiovascular;  Laterality: N/A;   ATRIAL FIBRILLATION ABLATION N/A 04/04/2020   Procedure: ATRIAL FIBRILLATION ABLATION;  Surgeon: Regan Lemming, MD;  Location: [facility] CV [facility];  Service: Cardiovascular;  Laterality: N/A;   BOWEL RESECTION     CARDIOVERSION     CORONARY ANGIOPLASTY WITH STENT PLACEMENT     CORONARY ARTERY BYPASS GRAFT  2016   x 3 vessels   CRYOABLATION     INSERT / REPLACE / REMOVE PACEMAKER      Current Outpatient Medications  Medication Sig Dispense Refill   acetaminophen (TYLENOL) 500 MG tablet Take 1,000 mg by mouth every 6 (six) hours as needed for moderate pain or headache.     albuterol (VENTOLIN HFA) 108 (90 Base) MCG/ACT inhaler Inhale 2 puffs into the lungs every 6 (six) hours as needed for wheezing or shortness of breath.      allopurinol (ZYLOPRIM) 100 MG tablet Take 100 mg by mouth daily.     ALPRAZolam (  XANAX) 0.25 MG tablet Take 0.125-0.25 mg by mouth at bedtime.      aspirin EC 81 MG tablet Take 81 mg by mouth daily.      atorvastatin (LIPITOR) 40 MG tablet Take 40 mg by mouth daily.      Biotin 5000 MCG CAPS Take 5,000 mcg by mouth daily.     carvedilol (COREG) 12.5 MG tablet TAKE 1 TABLET BY MOUTH 2 TIMES DAILY. 180 tablet 1   cloNIDine  (CATAPRES) 0.1 MG tablet Take 0.1 mg by mouth 2 (two) times daily.     ELIQUIS 5 MG TABS tablet TAKE 1 TABLET BY MOUTH TWICE A DAY 180 tablet 2   furosemide (LASIX) 40 MG tablet Take 20-40 mg by mouth See admin instructions. Take 40 mg in the morning and 20 mg in the evening     levothyroxine (SYNTHROID) 75 MCG tablet Take 75 mcg by mouth daily before breakfast.      losartan (COZAAR) 100 MG tablet Take 100 mg by mouth daily.     nitroGLYCERIN (NITROSTAT) 0.4 MG SL tablet Place 0.4 mg under the tongue every 5 (five) minutes x 3 doses as needed for chest pain.      potassium chloride (KLOR-CON) 10 MEQ tablet Take 10 mEq by mouth every other day.     pramipexole (MIRAPEX) 0.25 MG tablet Take 0.25 mg by mouth every evening.      saline (AYR) GEL Place 1 application into the nose daily as needed (dryness).     sotalol (BETAPACE) 80 MG tablet Take 1 tablet (80 mg total) by mouth 2 (two) times daily. 180 tablet 3   vitamin B-12 (CYANOCOBALAMIN) 1000 MCG tablet Take 1,000 mcg by mouth daily.     No current facility-administered medications for this encounter.    Allergies  Allergen Reactions   Tizanidine Itching and Other (See Comments)    "didnt like the way it made her feel" -- weakness    Ace Inhibitors Other (See Comments)    Cannot recall    Hydrocodone-Homatropine Nausea And Vomiting   Rofecoxib Nausea Only    MADE ME FEEL WEIRD     Social History   Socioeconomic History   Marital status: Married    Spouse name: Not on file   Number of children: Not on file   Years of education: Not on file   Highest education level: Not on file  Occupational History   Not on file  Tobacco Use   Smoking status: Former Smoker    Packs/day: 0.75    Years: 45.00    Pack years: 33.75    Types: Cigarettes    Quit date: 2009    Years since quitting: 12.8   Smokeless tobacco: Never Used  Building services engineerVaping Use   Vaping Use: Never used  Substance and Sexual Activity   Alcohol use:  Yes    Alcohol/week: 2.0 standard drinks    Types: 2 Cans of beer per week    Comment: 2 beers daily   Drug use: Never   Sexual activity: Not on file  Other Topics Concern   Not on file  Social History Narrative   Not on file   Social Determinants of Health   Financial Resource Strain:    Difficulty of Paying Living Expenses: Not on file  Food Insecurity:    Worried About Running Out of Food in the Last Year: Not on file   Ran Out of Food in the Last Year: Not on file  Transportation Needs:  Lack of Transportation (Medical): Not on file   Lack of Transportation (Non-Medical): Not on file  Physical Activity:    Days of Exercise per Week: Not on file   Minutes of Exercise per Session: Not on file  Stress:    Feeling of Stress : Not on file  Social Connections:    Frequency of Communication with Friends and Family: Not on file   Frequency of Social Gatherings with Friends and Family: Not on file   Attends Religious Services: Not on file   Active Member of Clubs or Organizations: Not on file   Attends Banker Meetings: Not on file   Marital Status: Not on file  Intimate Partner Violence:    Fear of Current or Ex-Partner: Not on file   Emotionally Abused: Not on file   Physically Abused: Not on file   Sexually Abused: Not on file     ROS- All systems are reviewed and negative except as per the HPI above.  Physical Exam: Vitals:   05/02/20 1340  BP: 138/82  Pulse: 63  Weight: 86 kg  Height: 5\' 6"  (1.676 m)    GEN- The patient is well appearing obese female, alert and oriented x 3 today.   HEENT-head normocephalic, atraumatic, sclera clear, conjunctiva pink, hearing intact, trachea midline. Lungs- Clear to ausculation bilaterally, normal work of breathing Heart- Regular rate and rhythm, no murmurs, rubs or gallops  GI- soft, NT, ND, + BS Extremities- no clubbing, cyanosis, or edema MS- no significant deformity or atrophy Skin-  no rash or lesion Psych- euthymic mood, full affect Neuro- strength and sensation are intact   Wt Readings from Last 3 Encounters:  05/02/20 86 kg  04/04/20 86.6 kg  03/30/20 88 kg    EKG today demonstrates AV dual paced rhythm HR 63, PR 192, QRS 196, QTc 540  Echo 01/10/19 demonstrated   1. Mild hypokinesis of the left ventricular, basal-mid inferoseptal wall.  2. The left ventricle has low normal systolic function, with an ejection fraction of 50-55%. The cavity size was normal. Left ventricular diastolic Doppler parameters are consistent with pseudonormalization.  3. The right ventricle has normal systolic function. The cavity was normal. There is no increase in right ventricular wall thickness.  4. Left atrial size was mildly dilated.  5. Mild thickening of the aortic valve. Mild calcification of the aortic valve. Aortic valve regurgitation was not assessed by color flow Doppler.  6. The aorta is normal in size and structure.   Epic records are reviewed at length today  Assessment and Plan:  1. Paroxysmal atrial fibrillation/atrial flutter S/p cryoablation 2019. S/p RF ablation 03/22/19 and 04/04/20 with Dr 04/06/20. Patient appears to be maintaining SR. Continue Eliquis 5 mg BID  Continue sotalol 80 mg BID. QT stable. Continue Coreg 12.5 mg BID  This patients CHA2DS2-VASc Score and unadjusted Ischemic Stroke Rate (% per year) is equal to 4.8 % stroke rate/year from a score of 4  Above score calculated as 1 point each if present [CHF, HTN, DM, Vascular=MI/PAD/Aortic Plaque, Age if 65-74, or Female] Above score calculated as 2 points each if present [Age > 75, or Stroke/TIA/TE]  2. Obesity Body mass index is 30.6 kg/m. Lifestyle modification was discussed and encouraged including regular physical activity and weight reduction. Patient plans to start walking regularly.   3. CAD S/p CABG. No anginal symptoms.  4. HTN Stable, no changes today.  5. Sick sinus  syndrome S/p PPM, followed by Dr 08-27-2000 and the device  clinic.   Follow up with Dr Elberta Fortis as scheduled.      Jorja Loa PA-C [facility] [address] [place], [place] [address] [phone] 05/02/2020 1:59 PM

## 2020-05-18 ENCOUNTER — Other Ambulatory Visit: Payer: Self-pay

## 2020-05-18 ENCOUNTER — Encounter: Payer: Self-pay | Admitting: Cardiology

## 2020-05-18 ENCOUNTER — Ambulatory Visit (INDEPENDENT_AMBULATORY_CARE_PROVIDER_SITE_OTHER): Payer: Medicare Other | Admitting: Cardiology

## 2020-05-18 VITALS — BP 108/70 | HR 75 | Ht 66.0 in | Wt 188.0 lb

## 2020-05-18 DIAGNOSIS — I2510 Atherosclerotic heart disease of native coronary artery without angina pectoris: Secondary | ICD-10-CM | POA: Diagnosis not present

## 2020-05-18 DIAGNOSIS — I495 Sick sinus syndrome: Secondary | ICD-10-CM

## 2020-05-18 DIAGNOSIS — I10 Essential (primary) hypertension: Secondary | ICD-10-CM

## 2020-05-18 DIAGNOSIS — I701 Atherosclerosis of renal artery: Secondary | ICD-10-CM

## 2020-05-18 DIAGNOSIS — I480 Paroxysmal atrial fibrillation: Secondary | ICD-10-CM

## 2020-05-18 NOTE — Progress Notes (Signed)
Cardiology Office Note:    Date:  05/18/2020   ID:  Diane Valentine, DOB 07-27-1950, MRN [id number]  PCP:  Forrest Moron, MD  Cardiologist:  Thomasene Ripple, DO  Electrophysiologist:  Regan Lemming, MD   Referring MD: Forrest Moron, MD   " I am doing well"  History of Present Illness:    Diane Valentine is a 69 y.o. female with a hx of SSS s/p PPM, CAD s/p CABG, HTN, renal artery stenosis, HLD, atrial flutter and atrial fibrillation status post ablation with most recent in 03/2020 and back in 2019 who presents for follow  Up visit. He is here today for a follow up visit.   She tells me that she is doing better from a CV standpoint. She denies any chest pain, or shortness of breath orthopnea, PND, lower extremity edema, dizziness, presyncope, syncope, snoring, daytime somnolence, bleeding.     Past Medical History:  Diagnosis Date  . CAD in native artery 01/02/2019  . Carotid stenosis, bilateral 01/02/2019  . Essential hypertension 01/02/2019  . History of amiodarone therapy 01/02/2019  . Hx of CABG 01/02/2019  . Hyperlipidemia 01/02/2019  . PAF (paroxysmal atrial fibrillation) (HCC) 01/02/2019  . Renal artery stenosis (HCC) 01/02/2019  . Sick sinus syndrome (HCC) 01/02/2019    Past Surgical History:  Procedure Laterality Date  . ATRIAL FIBRILLATION ABLATION N/A 03/22/2019   Procedure: ATRIAL FIBRILLATION ABLATION;  Surgeon: Regan Lemming, MD;  Location: [facility];  Service: Cardiovascular;  Laterality: N/A;  . ATRIAL FIBRILLATION ABLATION N/A 04/04/2020   Procedure: ATRIAL FIBRILLATION ABLATION;  Surgeon: Regan Lemming, MD;  Location: [facility];  Service: Cardiovascular;  Laterality: N/A;  . BOWEL RESECTION    . CARDIOVERSION    . CORONARY ANGIOPLASTY WITH STENT PLACEMENT    . CORONARY ARTERY BYPASS GRAFT  2016   x 3 vessels  . CRYOABLATION    . INSERT / REPLACE / REMOVE PACEMAKER      Current Medications: Current Meds  Medication Sig  .  acetaminophen (TYLENOL) 500 MG tablet Take 1,000 mg by mouth every 6 (six) hours as needed for moderate pain or headache.  . albuterol (VENTOLIN HFA) 108 (90 Base) MCG/ACT inhaler Inhale 2 puffs into the lungs every 6 (six) hours as needed for wheezing or shortness of breath.   . allopurinol (ZYLOPRIM) 100 MG tablet Take 100 mg by mouth daily.  Marland Kitchen ALPRAZolam (XANAX) 0.25 MG tablet Take 0.125-0.25 mg by mouth at bedtime.   Marland Kitchen aspirin EC 81 MG tablet Take 81 mg by mouth daily.   Marland Kitchen atorvastatin (LIPITOR) 40 MG tablet Take 40 mg by mouth daily.   . Biotin 5000 MCG CAPS Take 5,000 mcg by mouth daily.  . carvedilol (COREG) 12.5 MG tablet TAKE 1 TABLET BY MOUTH 2 TIMES DAILY.  . cloNIDine (CATAPRES) 0.1 MG tablet Take 0.1 mg by mouth 2 (two) times daily.  Marland Kitchen ELIQUIS 5 MG TABS tablet TAKE 1 TABLET BY MOUTH TWICE A DAY  . furosemide (LASIX) 40 MG tablet Take 20-40 mg by mouth See admin instructions. Take 40 mg in the morning and 20 mg in the evening  . levothyroxine (SYNTHROID) 75 MCG tablet Take 75 mcg by mouth daily before breakfast.   . losartan (COZAAR) 100 MG tablet Take 100 mg by mouth daily.  . nitroGLYCERIN (NITROSTAT) 0.4 MG SL tablet Place 0.4 mg under the tongue every 5 (five) minutes x 3 doses as needed for chest pain.   . potassium  chloride (KLOR-CON) 10 MEQ tablet Take 10 mEq by mouth every other day.  . pramipexole (MIRAPEX) 0.25 MG tablet Take 0.25 mg by mouth every evening.   . saline (AYR) GEL Place 1 application into the nose daily as needed (dryness).  . sotalol (BETAPACE) 80 MG tablet Take 1 tablet (80 mg total) by mouth 2 (two) times daily.  . vitamin B-12 (CYANOCOBALAMIN) 1000 MCG tablet Take 1,000 mcg by mouth daily.     Allergies:   Tizanidine, Ace inhibitors, Hydrocodone-homatropine, and Rofecoxib   Social History   Socioeconomic History  . Marital status: Married    Spouse name: Not on file  . Number of children: Not on file  . Years of education: Not on file  . Highest  education level: Not on file  Occupational History  . Not on file  Tobacco Use  . Smoking status: Former Smoker    Packs/day: 0.75    Years: 45.00    Pack years: 33.75    Types: Cigarettes    Quit date: 2009    Years since quitting: 12.9  . Smokeless tobacco: Never Used  Vaping Use  . Vaping Use: Never used  Substance and Sexual Activity  . Alcohol use: Yes    Alcohol/week: 2.0 standard drinks    Types: 2 Cans of beer per week    Comment: 2 beers daily  . Drug use: Never  . Sexual activity: Not on file  Other Topics Concern  . Not on file  Social History Narrative  . Not on file   Social Determinants of Health   Financial Resource Strain:   . Difficulty of Paying Living Expenses: Not on file  Food Insecurity:   . Worried About Programme researcher, broadcasting/film/video in the Last Year: Not on file  . Ran Out of Food in the Last Year: Not on file  Transportation Needs:   . Lack of Transportation (Medical): Not on file  . Lack of Transportation (Non-Medical): Not on file  Physical Activity:   . Days of Exercise per Week: Not on file  . Minutes of Exercise per Session: Not on file  Stress:   . Feeling of Stress : Not on file  Social Connections:   . Frequency of Communication with Friends and Family: Not on file  . Frequency of Social Gatherings with Friends and Family: Not on file  . Attends Religious Services: Not on file  . Active Member of Clubs or Organizations: Not on file  . Attends Banker Meetings: Not on file  . Marital Status: Not on file     Family History: The patient's family history includes Heart attack (age of onset: 10) in her father; Hypertension in her daughter, mother, sister, sister, and son.  ROS:   Review of Systems  Constitution: Negative for decreased appetite, fever and weight gain.  HENT: Negative for congestion, ear discharge, hoarse voice and sore throat.   Eyes: Negative for discharge, redness, vision loss in right eye and visual halos.   Cardiovascular: Negative for chest pain, dyspnea on exertion, leg swelling, orthopnea and palpitations.  Respiratory: Negative for cough, hemoptysis, shortness of breath and snoring.   Endocrine: Negative for heat intolerance and polyphagia.  Hematologic/Lymphatic: Negative for bleeding problem. Does not bruise/bleed easily.  Skin: Negative for flushing, nail changes, rash and suspicious lesions.  Musculoskeletal: Negative for arthritis, joint pain, muscle cramps, myalgias, neck pain and stiffness.  Gastrointestinal: Negative for abdominal pain, bowel incontinence, diarrhea and excessive appetite.  Genitourinary: Negative  for decreased libido, genital sores and incomplete emptying.  Neurological: Negative for brief paralysis, focal weakness, headaches and loss of balance.  Psychiatric/Behavioral: Negative for altered mental status, depression and suicidal ideas.  Allergic/Immunologic: Negative for HIV exposure and persistent infections.    EKGs/Labs/Other Studies Reviewed:    The following studies were reviewed today:   EKG:  The ekg ordered today demonstrates   Afib ablation 1. Comprehensive electrophysiologic study. 2. Coronary sinus pacing and recording. 3. Three-dimensional mapping of atrial fibrillation (with additional mapping and ablation within the left atrium due to persistence of afib) 4. Ablation of atrial fibrillation (with additional mapping and ablation within the left atrium due to persistence of afib) 5. Intracardiac echocardiography. 6. Transseptal puncture of an intact septum. 7. Arrhythmia induction with pacing  Echo 01/10/19 demonstrated  1. Mild hypokinesis of the left ventricular, basal-mid inferoseptal wall. 2. The left ventricle has low normal systolic function, with an ejection fraction of 50-55%. The cavity size was normal. Left ventricular diastolic Doppler parameters are consistent with pseudonormalization. 3. The right ventricle has normal systolic  function. The cavity was normal. There is no increase in right ventricular wall thickness. 4. Left atrial size was mildly dilated. 5. Mild thickening of the aortic valve. Mild calcification of the aortic valve. Aortic valve regurgitation was not assessed by color flow Doppler. 6. The aorta is normal in size and structure.   Epic records are reviewed at length today   Recent Labs: 10/19/2019: Magnesium 2.2 03/28/2020: Hemoglobin 14.1; Platelets 134 03/30/2020: BUN 28; Creatinine, Ser 1.45; Potassium 4.2; Sodium 132  Recent Lipid Panel    Component Value Date/Time   CHOL 167 01/03/2019 1713   TRIG 133 01/03/2019 1713   HDL 61 01/03/2019 1713   CHOLHDL 2.7 01/03/2019 1713   LDLCALC 79 01/03/2019 1713    Physical Exam:    VS:  BP 108/70   Pulse 75   Ht 5\' 6"  (1.676 m)   Wt 188 lb (85.3 kg)   SpO2 95%   BMI 30.34 kg/m     Wt Readings from Last 3 Encounters:  05/18/20 188 lb (85.3 kg)  05/02/20 189 lb 9.6 oz (86 kg)  04/04/20 191 lb (86.6 kg)     GEN: Well nourished, well developed in no acute distress HEENT: Normal NECK: No JVD; No carotid bruits LYMPHATICS: No lymphadenopathy CARDIAC: S1S2 noted,RRR, no murmurs, rubs, gallops RESPIRATORY:  Clear to auscultation without rales, wheezing or rhonchi  ABDOMEN: Soft, non-tender, non-distended, +bowel sounds, no guarding. EXTREMITIES: No edema, No cyanosis, no clubbing MUSCULOSKELETAL:  No deformity  SKIN: Warm and dry NEUROLOGIC:  Alert and oriented x 3, non-focal PSYCHIATRIC:  Normal affect, good insight  ASSESSMENT:    1. CAD in native artery   2. Essential hypertension   3. Renal artery stenosis (HCC)   4. Sick sinus syndrome (HCC)   5. PAF (paroxysmal atrial fibrillation) (HCC)    PLAN:     She has no angina symptoms continue patient on current medication regimen. She will continue on her Eliquis, sotalol and also carvedilol 12.5 mg twice daily.  We will continue to monitor her EKG on sotalol for any  signs of QT prolongation. The patient understands the need to lose weight with diet and exercise. We have discussed specific strategies for this. She follows with device clinic for her pacemaker interrogation. Her blood pressure deceptively in the office today no changes will be made to her antihypertensive regimen.  The patient is in agreement with the above plan.  The patient left the office in stable condition.  The patient will follow up in 6 months.   Medication Adjustments/Labs and Tests Ordered: Current medicines are reviewed at length with the patient today.  Concerns regarding medicines are outlined above.  No orders of the defined types were placed in this encounter.  No orders of the defined types were placed in this encounter.   Patient Instructions  Medication Instructions:  Your physician recommends that you continue on your current medications as directed. Please refer to the Current Medication list given to you today. *If you need a refill on your cardiac medications before your next appointment, please call your pharmacy*   Lab Work: NONE If you have labs (blood work) drawn today and your tests are completely normal, you will receive your results only by: Marland Kitchen MyChart Message (if you have MyChart) OR . A paper copy in the mail If you have any lab test that is abnormal or we need to change your treatment, we will call you to review the results.   Testing/Procedures: NONE   Follow-Up: At [facility], you and your health needs are our priority.  As part of our continuing mission to provide you with exceptional heart care, we have created designated Provider Care Teams.  These Care Teams include your primary Cardiologist (physician) and Advanced Practice Providers (APPs -  Physician Assistants and Nurse Practitioners) who all work together to provide you with the care you need, when you need it.  We recommend signing up for the patient portal called "MyChart".  Sign up  information is provided on this After Visit Summary.  MyChart is used to connect with patients for Virtual Visits (Telemedicine).  Patients are able to view lab/test results, encounter notes, upcoming appointments, etc.  Non-urgent messages can be sent to your provider as well.   To learn more about what you can do with MyChart, go to [email or web address].    Your next appointment:   6 month(s)  The format for your next appointment:   In Person  Provider:   Thomasene Ripple, DO       Adopting a Healthy Lifestyle.  Know what a healthy weight is for you (roughly BMI <25) and aim to maintain this   Aim for 7+ servings of fruits and vegetables daily   65-80+ fluid ounces of water or unsweet tea for healthy kidneys   Limit to max 1 drink of alcohol per day; avoid smoking/tobacco   Limit animal fats in diet for cholesterol and heart health - choose grass fed whenever available   Avoid highly processed foods, and foods high in saturated/trans fats   Aim for low stress - take time to unwind and care for your mental health   Aim for 150 min of moderate intensity exercise weekly for heart health, and weights twice weekly for bone health   Aim for 7-9 hours of sleep daily   When it comes to diets, agreement about the perfect plan isnt easy to find, even among the experts. Experts at the [facility] [employer] developed an idea known as the Healthy Eating Plate. Just imagine a plate divided into logical, healthy portions.   The emphasis is on diet quality:   Load up on vegetables and fruits - one-half of your plate: Aim for color and variety, and remember that potatoes dont count.   Go for whole grains - one-quarter of your plate: Whole wheat, barley, wheat berries, quinoa, oats, brown rice, and foods made with them.  If you want pasta, go with whole wheat pasta.   Protein power - one-quarter of your plate: Fish, chicken, beans, and nuts are all healthy, versatile protein  sources. Limit red meat.   The diet, however, does go beyond the plate, offering a few other suggestions.   Use healthy plant oils, such as olive, canola, soy, corn, sunflower and peanut. Check the labels, and avoid partially hydrogenated oil, which have unhealthy trans fats.   If youre thirsty, drink water. Coffee and tea are good in moderation, but skip sugary drinks and limit milk and dairy products to one or two daily servings.   The type of carbohydrate in the diet is more important than the amount. Some sources of carbohydrates, such as vegetables, fruits, whole grains, and beans-are healthier than others.   Finally, stay active  Signed, Thomasene RippleKardie Jaimie Redditt, DO  05/18/2020 10:25 PM    [facility]

## 2020-05-18 NOTE — Patient Instructions (Signed)
Medication Instructions:  Your physician recommends that you continue on your current medications as directed. Please refer to the Current Medication list given to you today.  *If you need a refill on your cardiac medications before your next appointment, please call your pharmacy*   Lab Work: NONE If you have labs (blood work) drawn today and your tests are completely normal, you will receive your results only by: MyChart Message (if you have MyChart) OR A paper copy in the mail If you have any lab test that is abnormal or we need to change your treatment, we will call you to review the results.   Testing/Procedures: NONE   Follow-Up: At [facility], you and your health needs are our priority.  As part of our continuing mission to provide you with exceptional heart care, we have created designated Provider Care Teams.  These Care Teams include your primary Cardiologist (physician) and Advanced Practice Providers (APPs -  Physician Assistants and Nurse Practitioners) who all work together to provide you with the care you need, when you need it.  We recommend signing up for the patient portal called "MyChart".  Sign up information is provided on this After Visit Summary.  MyChart is used to connect with patients for Virtual Visits (Telemedicine).  Patients are able to view lab/test results, encounter notes, upcoming appointments, etc.  Non-urgent messages can be sent to your provider as well.   To learn more about what you can do with MyChart, go to [email or web address].    Your next appointment:   6 month(s)  The format for your next appointment:   In Person  Provider:   Kardie Tobb, DO   

## 2020-06-06 ENCOUNTER — Ambulatory Visit (INDEPENDENT_AMBULATORY_CARE_PROVIDER_SITE_OTHER): Payer: Medicare Other

## 2020-06-06 DIAGNOSIS — I495 Sick sinus syndrome: Secondary | ICD-10-CM | POA: Diagnosis not present

## 2020-06-06 LAB — CUP PACEART REMOTE DEVICE CHECK
Battery Remaining Longevity: 121 mo
Battery Remaining Percentage: 95.5 %
Battery Voltage: 3.01 V
Brady Statistic AP VP Percent: 96 %
Brady Statistic AP VS Percent: 1 %
Brady Statistic AS VP Percent: 3.4 %
Brady Statistic AS VS Percent: 1 %
Brady Statistic RA Percent Paced: 96 %
Brady Statistic RV Percent Paced: 99 %
Date Time Interrogation Session: 20211222020017
Implantable Lead Implant Date: 20190909
Implantable Lead Implant Date: 20190909
Implantable Lead Location: 753859
Implantable Lead Location: 753860
Implantable Pulse Generator Implant Date: 20190909
Lead Channel Impedance Value: 450 Ohm
Lead Channel Impedance Value: 540 Ohm
Lead Channel Pacing Threshold Amplitude: 1 V
Lead Channel Pacing Threshold Amplitude: 1 V
Lead Channel Pacing Threshold Pulse Width: 0.4 ms
Lead Channel Pacing Threshold Pulse Width: 0.4 ms
Lead Channel Sensing Intrinsic Amplitude: 12 mV
Lead Channel Sensing Intrinsic Amplitude: 2.6 mV
Lead Channel Setting Pacing Amplitude: 1.25 V
Lead Channel Setting Pacing Amplitude: 2 V
Lead Channel Setting Pacing Pulse Width: 0.4 ms
Lead Channel Setting Sensing Sensitivity: 2 mV
Pulse Gen Model: 2272
Pulse Gen Serial Number: 9053085

## 2020-06-14 ENCOUNTER — Other Ambulatory Visit (HOSPITAL_COMMUNITY): Payer: Self-pay | Admitting: Cardiology

## 2020-06-14 NOTE — Telephone Encounter (Signed)
Prescription refill request for Eliquis received. Indication:afib Last office visit:05/18/20 Scr:1.45 Age: 69 Weight:85kg

## 2020-06-20 NOTE — Progress Notes (Signed)
Remote pacemaker transmission.   

## 2020-07-23 ENCOUNTER — Other Ambulatory Visit: Payer: Self-pay

## 2020-07-23 ENCOUNTER — Ambulatory Visit (INDEPENDENT_AMBULATORY_CARE_PROVIDER_SITE_OTHER): Payer: Medicare Other | Admitting: Cardiology

## 2020-07-23 ENCOUNTER — Encounter: Payer: Self-pay | Admitting: Cardiology

## 2020-07-23 VITALS — BP 126/78 | HR 60 | Ht 66.0 in | Wt 188.0 lb

## 2020-07-23 DIAGNOSIS — I480 Paroxysmal atrial fibrillation: Secondary | ICD-10-CM

## 2020-07-23 DIAGNOSIS — I495 Sick sinus syndrome: Secondary | ICD-10-CM

## 2020-07-23 LAB — CUP PACEART INCLINIC DEVICE CHECK
Battery Remaining Longevity: 116 mo
Battery Voltage: 2.99 V
Brady Statistic RA Percent Paced: 95 %
Brady Statistic RV Percent Paced: 99.71 %
Date Time Interrogation Session: 20220207145100
Implantable Lead Implant Date: 20190909
Implantable Lead Implant Date: 20190909
Implantable Lead Location: 753859
Implantable Lead Location: 753860
Implantable Pulse Generator Implant Date: 20190909
Lead Channel Impedance Value: 425 Ohm
Lead Channel Impedance Value: 512.5 Ohm
Lead Channel Pacing Threshold Amplitude: 0.875 V
Lead Channel Pacing Threshold Amplitude: 0.875 V
Lead Channel Pacing Threshold Pulse Width: 0.4 ms
Lead Channel Pacing Threshold Pulse Width: 0.4 ms
Lead Channel Sensing Intrinsic Amplitude: 1.9 mV
Lead Channel Sensing Intrinsic Amplitude: 12 mV
Lead Channel Setting Pacing Amplitude: 1.125
Lead Channel Setting Pacing Amplitude: 1.875
Lead Channel Setting Pacing Pulse Width: 0.4 ms
Lead Channel Setting Sensing Sensitivity: 2 mV
Pulse Gen Model: 2272
Pulse Gen Serial Number: 9053085

## 2020-07-23 NOTE — Progress Notes (Signed)
Electrophysiology Office Note   Date:  07/23/2020   ID:  Diane Valentine, DOB 1951/02/09, MRN [id number]  PCP:  Forrest Moron, MD  Cardiologist:  Dulce Sellar Primary Electrophysiologist:  Pollie Poma Jorja Loa, MD    No chief complaint on file.    History of Present Illness: Diane Valentine is a 70 y.o. female who is being seen today for the evaluation of pacemaker at the request of Forrest Moron, MD. Presenting today for electrophysiology evaluation.  She has a history significant for paroxysmal atrial fibrillation, sick sinus syndrome status post Saint Jude dual-chamber pacemaker, coronary artery disease status post CABG, hypertension, renal artery stenosis, hyperlipidemia.  She had cryoablation in 2019 for atrial fibrillation.  She had repeat AF ablation 03/22/2019.  She unfortunately went back into atrial fibrillation and now status post ablation 04/05/2020.  Today, denies symptoms of palpitations, chest pain, shortness of breath, orthopnea, PND, lower extremity edema, claudication, dizziness, presyncope, syncope, bleeding, or neurologic sequela. The patient is tolerating medications without difficulties.  Since being seen she has done well.  She has no chest pain or shortness of breath.  She is able do all of her daily activities.  She is fortunately remained in sinus rhythm and feels well.  Past Medical History:  Diagnosis Date  . CAD in native artery 01/02/2019  . Carotid stenosis, bilateral 01/02/2019  . Essential hypertension 01/02/2019  . History of amiodarone therapy 01/02/2019  . Hx of CABG 01/02/2019  . Hyperlipidemia 01/02/2019  . PAF (paroxysmal atrial fibrillation) (HCC) 01/02/2019  . Renal artery stenosis (HCC) 01/02/2019  . Sick sinus syndrome (HCC) 01/02/2019   Past Surgical History:  Procedure Laterality Date  . ATRIAL FIBRILLATION ABLATION N/A 03/22/2019   Procedure: ATRIAL FIBRILLATION ABLATION;  Surgeon: Regan Lemming, MD;  Location: [facility];  Service:  Cardiovascular;  Laterality: N/A;  . ATRIAL FIBRILLATION ABLATION N/A 04/04/2020   Procedure: ATRIAL FIBRILLATION ABLATION;  Surgeon: Regan Lemming, MD;  Location: [facility] CV [facility];  Service: Cardiovascular;  Laterality: N/A;  . BOWEL RESECTION    . CARDIOVERSION    . CORONARY ANGIOPLASTY WITH STENT PLACEMENT    . CORONARY ARTERY BYPASS GRAFT  2016   x 3 vessels  . CRYOABLATION    . INSERT / REPLACE / REMOVE PACEMAKER       Current Outpatient Medications  Medication Sig Dispense Refill  . acetaminophen (TYLENOL) 500 MG tablet Take 1,000 mg by mouth every 6 (six) hours as needed for moderate pain or headache.    . albuterol (VENTOLIN HFA) 108 (90 Base) MCG/ACT inhaler Inhale 2 puffs into the lungs every 6 (six) hours as needed for wheezing or shortness of breath.     . allopurinol (ZYLOPRIM) 100 MG tablet Take 100 mg by mouth daily.    Marland Kitchen ALPRAZolam (XANAX) 0.25 MG tablet Take 0.125-0.25 mg by mouth at bedtime.    Marland Kitchen aspirin EC 81 MG tablet Take 81 mg by mouth daily.     Marland Kitchen atorvastatin (LIPITOR) 40 MG tablet Take 40 mg by mouth daily.     . Biotin 5000 MCG CAPS Take 5,000 mcg by mouth daily.    . carvedilol (COREG) 12.5 MG tablet TAKE 1 TABLET BY MOUTH 2 TIMES DAILY. 180 tablet 1  . cloNIDine (CATAPRES) 0.1 MG tablet Take 0.1 mg by mouth 2 (two) times daily.    Marland Kitchen ELIQUIS 5 MG TABS tablet TAKE 1 TABLET BY MOUTH TWICE A DAY 180 tablet 1  . furosemide (LASIX) 40 MG tablet  Take 20-40 mg by mouth See admin instructions. Take 40 mg in the morning and 20 mg in the evening    . levothyroxine (SYNTHROID) 75 MCG tablet Take 75 mcg by mouth daily before breakfast.     . losartan (COZAAR) 100 MG tablet Take 100 mg by mouth daily.    . nitroGLYCERIN (NITROSTAT) 0.4 MG SL tablet Place 0.4 mg under the tongue every 5 (five) minutes x 3 doses as needed for chest pain.     . potassium chloride (KLOR-CON) 10 MEQ tablet Take 10 mEq by mouth every other day.    . pramipexole (MIRAPEX) 0.25 MG tablet  Take 0.25 mg by mouth every evening.     . saline (AYR) GEL Place 1 application into the nose daily as needed (dryness).    . sotalol (BETAPACE) 80 MG tablet Take 1 tablet (80 mg total) by mouth 2 (two) times daily. 180 tablet 3  . vitamin B-12 (CYANOCOBALAMIN) 1000 MCG tablet Take 1,000 mcg by mouth daily.     No current facility-administered medications for this visit.    Allergies:   Tizanidine, Ace inhibitors, Hydrocodone-homatropine, and Rofecoxib   Social History:  The patient  reports that she quit smoking about 13 years ago. Her smoking use included cigarettes. She has a 33.75 pack-year smoking history. She has never used smokeless tobacco. She reports current alcohol use of about 2.0 standard drinks of alcohol per week. She reports that she does not use drugs.   Family History:  The patient's family history includes Heart attack (age of onset: 20) in her father; Hypertension in her daughter, mother, sister, sister, and son.   ROS:  Please see the history of present illness.   Otherwise, review of systems is positive for none.   All other systems are reviewed and negative.   PHYSICAL EXAM: VS:  BP 126/78   Pulse 60   Ht 5\' 6"  (1.676 m)   Wt 188 lb (85.3 kg)   SpO2 93%   BMI 30.34 kg/m  , BMI Body mass index is 30.34 kg/m. GEN: Well nourished, well developed, in no acute distress  HEENT: normal  Neck: no JVD, carotid bruits, or masses Cardiac: RRR; no murmurs, rubs, or gallops,no edema  Respiratory:  clear to auscultation bilaterally, normal work of breathing GI: soft, nontender, nondistended, + BS MS: no deformity or atrophy  Skin: warm and dry, device site well healed Neuro:  Strength and sensation are intact Psych: euthymic mood, full affect  EKG:  EKG is ordered today. Personal review of the ekg ordered shows AV paced  Personal review of the device interrogation today. Results in Paceart   Recent Labs: 10/19/2019: Magnesium 2.2 03/28/2020: Hemoglobin 14.1;  Platelets 134 03/30/2020: BUN 28; Creatinine, Ser 1.45; Potassium 4.2; Sodium 132    Lipid Panel     Component Value Date/Time   CHOL 167 01/03/2019 1713   TRIG 133 01/03/2019 1713   HDL 61 01/03/2019 1713   CHOLHDL 2.7 01/03/2019 1713   LDLCALC 79 01/03/2019 1713     Wt Readings from Last 3 Encounters:  07/23/20 188 lb (85.3 kg)  05/18/20 188 lb (85.3 kg)  05/02/20 189 lb 9.6 oz (86 kg)      Other studies Reviewed: Additional studies/ records that were reviewed today include: TTE 01/10/19  Review of the above records today demonstrates:   1. Mild hypokinesis of the left ventricular, basal-mid inferoseptal wall.  2. The left ventricle has low normal systolic function, with an ejection fraction  of 50-55%. The cavity size was normal. Left ventricular diastolic Doppler parameters are consistent with pseudonormalization.  3. The right ventricle has normal systolic function. The cavity was normal. There is no increase in right ventricular wall thickness.  4. Left atrial size was mildly dilated.  5. Mild thickening of the aortic valve. Mild calcification of the aortic valve. Aortic valve regurgitation was not assessed by color flow Doppler.  6. The aorta is normal in size and structure.   ASSESSMENT AND PLAN:  1.  Sick sinus syndrome: Status post Saint Jude dual-chamber pacemaker.  Device functioning appropriately.  No changes.  2.  Paroxysmal atrial fibrillation: Status post cryoablation now repeat ablation 03/22/2019.  Unfortunately she has had more episodes of atrial fibrillation.  Currently on sotalol and Eliquis with a CHA2DS2-VASc of 4.  High risk medication monitoring performed.  Repeat ablation 04/05/2020.  Remains in sinus rhythm.  No changes.  3.  Hypertension: Currently well controlled  4.  Hyperlipidemia: Continue statin per primary cardiology   Current medicines are reviewed at length with the patient today.   The patient does not have concerns regarding her  medicines.  The following changes were made today: None  Labs/ tests ordered today include:  Orders Placed This Encounter  Procedures  . EKG 12-Lead    Disposition:   FU with Raiana Pharris 3 months  Signed, Valin Massie Jorja Loa, MD  07/23/2020 2:05 PM     [facility] [address] [place] [address] [phone] (office) [phone] (fax)

## 2020-07-23 NOTE — Patient Instructions (Signed)
Medication Instructions:  Your physician recommends that you continue on your current medications as directed. Please refer to the Current Medication list given to you today.  *If you need a refill on your cardiac medications before your next appointment, please call your pharmacy*   Lab Work: None ordered   Testing/Procedures: None ordered   Follow-Up: At [facility], you and your health needs are our priority.  As part of our continuing mission to provide you with exceptional heart care, we have created designated Provider Care Teams.  These Care Teams include your primary Cardiologist (physician) and Advanced Practice Providers (APPs -  Physician Assistants and Nurse Practitioners) who all work together to provide you with the care you need, when you need it.  Remote monitoring is used to monitor your Pacemaker or ICD from home. This monitoring reduces the number of office visits required to check your device to one time per year. It allows [place] to keep an eye on the functioning of your device to ensure it is working properly. You are scheduled for a device check from home on 09/05/2020. You may send your transmission at any time that day. If you have a wireless device, the transmission will be sent automatically. After your physician reviews your transmission, you will receive a postcard with your next transmission date.  Your next appointment:   3 month(s)  The format for your next appointment:   In Person  Provider:   Loman Brooklyn, MD   Thank you for choosing [facility]!!   Dory Horn, RN [phone]

## 2020-08-30 ENCOUNTER — Other Ambulatory Visit: Payer: Self-pay | Admitting: Cardiology

## 2020-09-03 MED ORDER — CARVEDILOL 12.5 MG PO TABS
12.5000 mg | ORAL_TABLET | Freq: Two times a day (BID) | ORAL | 3 refills | Status: DC
Start: 2020-09-03 — End: 2021-08-01

## 2020-09-03 MED ORDER — SOTALOL HCL (AF) 80 MG PO TABS: ORAL_TABLET | ORAL | 3 refills | Status: DC

## 2020-09-05 ENCOUNTER — Ambulatory Visit (INDEPENDENT_AMBULATORY_CARE_PROVIDER_SITE_OTHER): Payer: Medicare Other

## 2020-09-05 DIAGNOSIS — I495 Sick sinus syndrome: Secondary | ICD-10-CM | POA: Diagnosis not present

## 2020-09-05 LAB — CUP PACEART REMOTE DEVICE CHECK
Battery Remaining Longevity: 121 mo
Battery Remaining Percentage: 95.5 %
Battery Voltage: 2.99 V
Brady Statistic AP VP Percent: 92 %
Brady Statistic AP VS Percent: 1 %
Brady Statistic AS VP Percent: 7.5 %
Brady Statistic AS VS Percent: 1 %
Brady Statistic RA Percent Paced: 92 %
Brady Statistic RV Percent Paced: 99 %
Date Time Interrogation Session: 20220323020015
Implantable Lead Implant Date: 20190909
Implantable Lead Implant Date: 20190909
Implantable Lead Location: 753859
Implantable Lead Location: 753860
Implantable Pulse Generator Implant Date: 20190909
Lead Channel Impedance Value: 410 Ohm
Lead Channel Impedance Value: 530 Ohm
Lead Channel Pacing Threshold Amplitude: 0.75 V
Lead Channel Pacing Threshold Amplitude: 1 V
Lead Channel Pacing Threshold Pulse Width: 0.4 ms
Lead Channel Pacing Threshold Pulse Width: 0.4 ms
Lead Channel Sensing Intrinsic Amplitude: 12 mV
Lead Channel Sensing Intrinsic Amplitude: 2.5 mV
Lead Channel Setting Pacing Amplitude: 1.25 V
Lead Channel Setting Pacing Amplitude: 1.75 V
Lead Channel Setting Pacing Pulse Width: 0.4 ms
Lead Channel Setting Sensing Sensitivity: 2 mV
Pulse Gen Model: 2272
Pulse Gen Serial Number: 9053085

## 2020-09-17 NOTE — Progress Notes (Signed)
Remote pacemaker transmission.   

## 2020-10-10 IMAGING — CT CT HEART MORPH/PULM VEIN W/ CM & W/O CA SCORE
2 of 6 series · 10 of 20 positions shown, 12 images · non-contrast
Comparison: 08/18/2017
COMPARISON: 08/18/2017

Addendum:
EXAM:
OVER-READ INTERPRETATION  CT CHEST

The following report is an over-read performed by radiologist Dr.
Kilyan Zaz [REDACTED] on 03/15/2019. This over-read
does not include interpretation of cardiac or coronary anatomy or
pathology. The coronary CTA interpretation by the cardiologist is
attached.
CLINICAL DATA: Atrial fibrillation scheduled for an ablation.
Cardiac CT/CTA
TECHNIQUE: The patient was scanned on a Siemens Force [REDACTED]ice  scanner.

[Series 6: best diast · axial · 0.39mm/px · z∈[-101,+47]mm · 5 of 560 slices shown, 7 images]
[im 94/560  vessel]
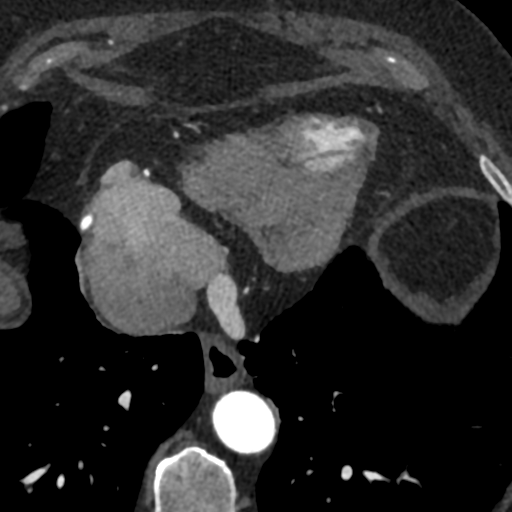
[im 94/560  lung]
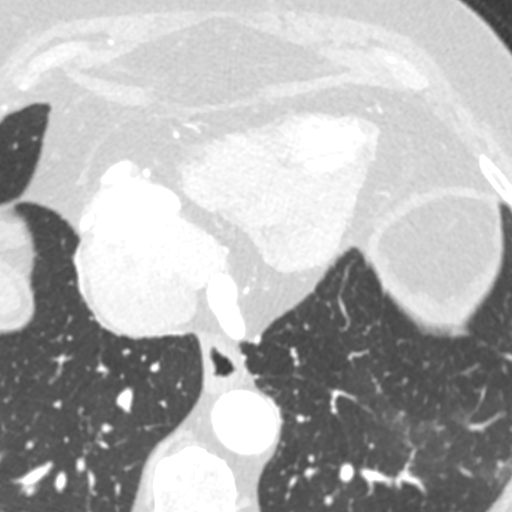
[im 187/560  vessel]
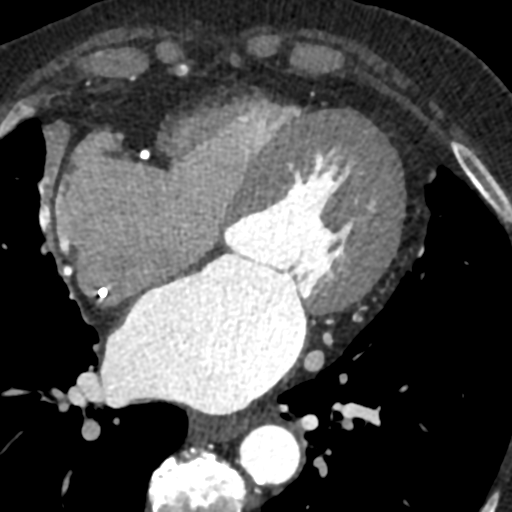
[im 280/560  vessel]
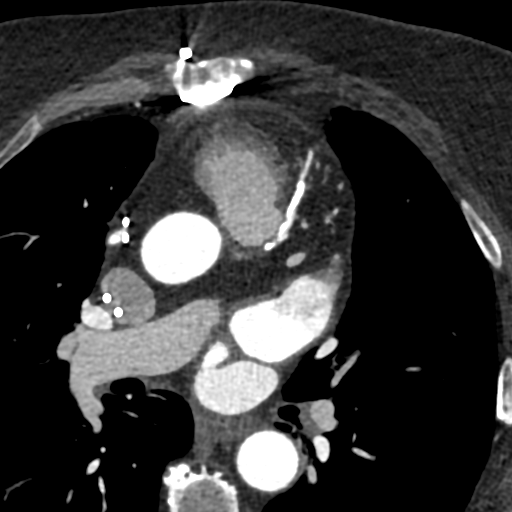
[im 373/560  vessel]
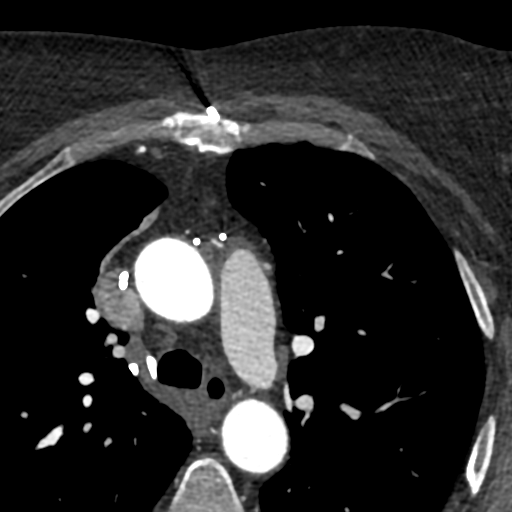
[im 466/560  vessel]
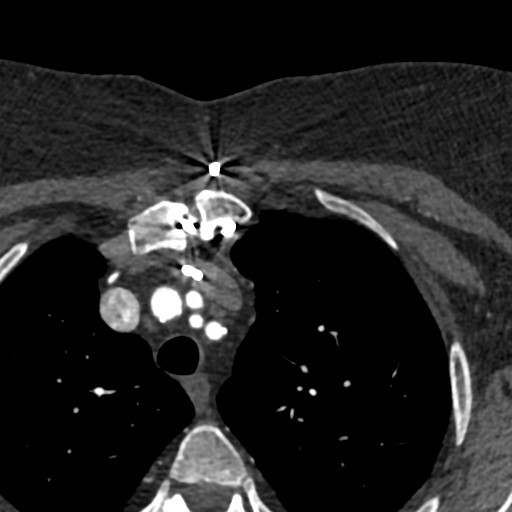
[im 466/560  lung]
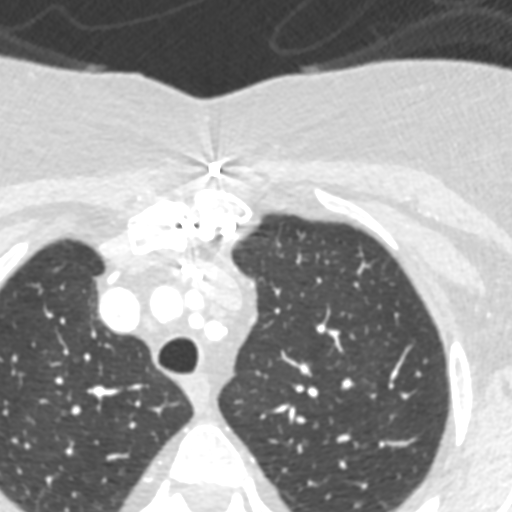

[Series 7: +300 ms · axial · 0.39mm/px · z∈[-101,+47]mm · 5 of 560 slices shown]
[im 94/560  vessel]
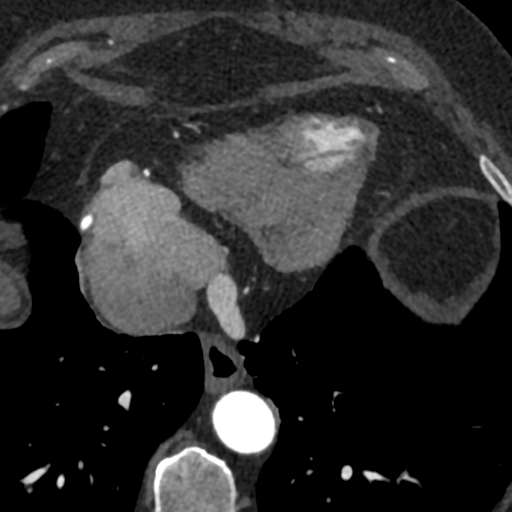
[im 187/560  vessel]
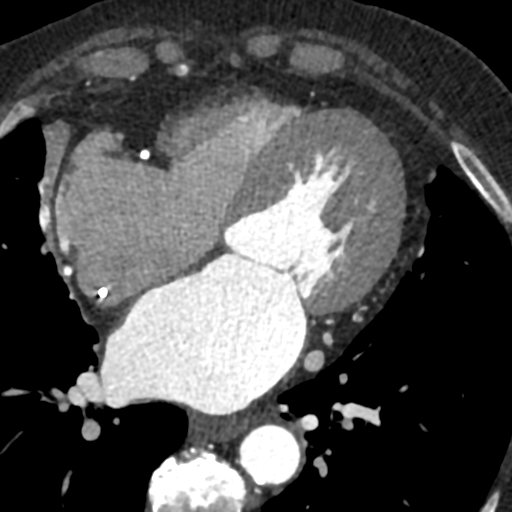
[im 280/560  vessel]
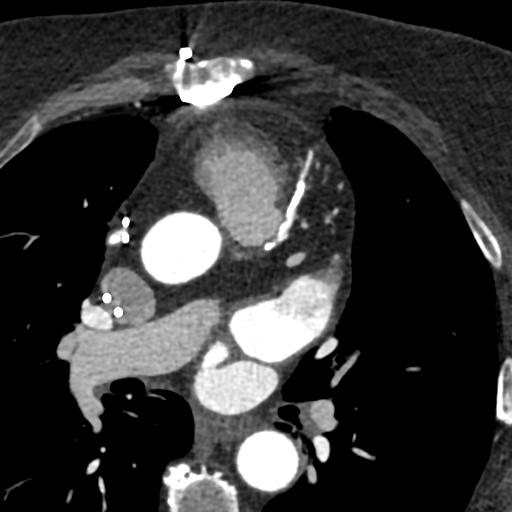
[im 373/560  vessel]
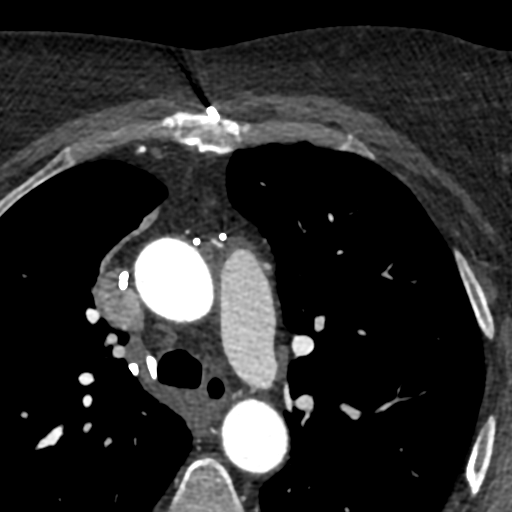
[im 466/560  vessel]
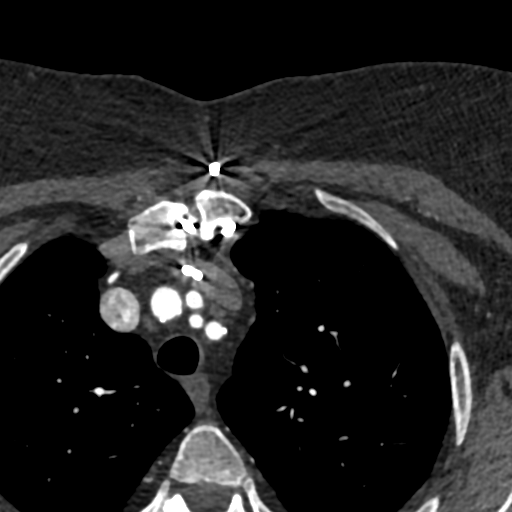

[10 of 20 positions shown; findings below may reference images not displayed]

FINDINGS: Vascular: Heart is enlarged. Pacer wires noted in the right heart.
Aortic atherosclerosis. No evidence of aortic aneurysm or
dissection.

Mediastinum/Nodes: No mediastinal, hilar, or axillary adenopathy.
Trachea and esophagus are unremarkable.

Lungs/Pleura: Platelike atelectasis medially in both the right
middle lobe and lingula. This is stable since prior study. No other
confluent opacities or effusions.

Upper Abdomen: Imaging into the upper abdomen shows no acute
findings.

Musculoskeletal: Chest wall soft tissues are unremarkable. No acute
bony abnormality.
IMPRESSION: Cardiomegaly.  Pacer wires in the right heart.

Areas of atelectasis medially around the heart in the lingula and
right middle lobe, unchanged since prior study.

No acute extra cardiac abnormality.
FINDINGS: A 120 kV prospective scan was triggered in the ascending thoracic
aorta at 140 HU's. Gantry rotation speed was 250 msecs and
collimation was .6 mm. No beta blockade and no NTG was given. The 3D
data set was reconstructed for best systolic and diastolic phases
along with delayed images of the NAZARETH Images analyzed on a dedicated
work station using MPR, MIP and VRT modes. The patient received 80
cc of contrast.

Moderate bi atrial enlargement. No NAZARETH thrombus. Residual PFO/ASD
central septum likely from previous transseptal no pericardial
effusion Normal aortic root 3.0 cm Normal PV anatomy with no
anomalies.

RUPV: Ostium 15 mm  area 1.7 cm2

RLPV:  Ostium 20.6 mm  area 3.2 cm2

LUPV:  Ostium 20.1 mm area 2.8 cm2

LLPV:  Ostium 13.7 mm  area 2.1 cm2

Grafts: Large patent HARRINGTON to PDA. Patent but diminutive HARRINGTON to OM
Atretic [REDACTED] to LAD
IMPRESSION: 1.  Moderate bi atrial enlargement No NAZARETH thrombus

2. Residual PFO/ASD central likely from previous transseptal
puncture

3.  Normal PV anatomy with no stenosis

4.  Patent HARRINGTON to RCA and OM with atretic [REDACTED]

5.  Normal aortic root 3.0 cm

6.  No pericardial effusion

Iszman Fairol

*** End of Addendum ***
EXAM:
OVER-READ INTERPRETATION  CT CHEST

The following report is an over-read performed by radiologist Dr.
Kilyan Zaz [REDACTED] on 03/15/2019. This over-read
does not include interpretation of cardiac or coronary anatomy or
pathology. The coronary CTA interpretation by the cardiologist is
attached.
FINDINGS: Vascular: Heart is enlarged. Pacer wires noted in the right heart.
Aortic atherosclerosis. No evidence of aortic aneurysm or
dissection.

Mediastinum/Nodes: No mediastinal, hilar, or axillary adenopathy.
Trachea and esophagus are unremarkable.

Lungs/Pleura: Platelike atelectasis medially in both the right
middle lobe and lingula. This is stable since prior study. No other
confluent opacities or effusions.

Upper Abdomen: Imaging into the upper abdomen shows no acute
findings.

Musculoskeletal: Chest wall soft tissues are unremarkable. No acute
bony abnormality.
IMPRESSION: Cardiomegaly.  Pacer wires in the right heart.

Areas of atelectasis medially around the heart in the lingula and
right middle lobe, unchanged since prior study.

No acute extra cardiac abnormality.

## 2020-10-29 ENCOUNTER — Encounter: Payer: Self-pay | Admitting: Cardiology

## 2020-10-29 ENCOUNTER — Other Ambulatory Visit: Payer: Self-pay

## 2020-10-29 ENCOUNTER — Ambulatory Visit (INDEPENDENT_AMBULATORY_CARE_PROVIDER_SITE_OTHER): Payer: Medicare Other | Admitting: Cardiology

## 2020-10-29 VITALS — BP 160/98 | HR 60 | Ht 66.0 in | Wt 184.0 lb

## 2020-10-29 DIAGNOSIS — I495 Sick sinus syndrome: Secondary | ICD-10-CM

## 2020-10-29 NOTE — Patient Instructions (Signed)
Medication Instructions:  Your physician recommends that you continue on your current medications as directed. Please refer to the Current Medication list given to you today.  *If you need a refill on your cardiac medications before your next appointment, please call your pharmacy*   Lab Work: None ordered   Testing/Procedures: None ordered   Follow-Up: At [facility], you and your health needs are our priority.  As part of our continuing mission to provide you with exceptional heart care, we have created designated Provider Care Teams.  These Care Teams include your primary Cardiologist (physician) and Advanced Practice Providers (APPs -  Physician Assistants and Nurse Practitioners) who all work together to provide you with the care you need, when you need it.  Remote monitoring is used to monitor your Pacemaker or ICD from home. This monitoring reduces the number of office visits required to check your device to one time per year. It allows [place] to keep an eye on the functioning of your device to ensure it is working properly. You are scheduled for a device check from home on 12/05/2020. You may send your transmission at any time that day. If you have a wireless device, the transmission will be sent automatically. After your physician reviews your transmission, you will receive a postcard with your next transmission date.  Your next appointment:   6 month(s)  The format for your next appointment:   In Person  Provider:   Loman Brooklyn, MD   Thank you for choosing [facility]!!   Dory Horn, RN [phone]    Other Instructions

## 2020-10-29 NOTE — Progress Notes (Signed)
Electrophysiology Office Note   Date:  10/29/2020   ID:  Diane Valentine, DOB 07/27/1950, MRN [id number]  PCP:  Forrest Moron, MD  Cardiologist:  Dulce Sellar Primary Electrophysiologist:  Fredrik Mogel Jorja Loa, MD    No chief complaint on file.    History of Present Illness: Diane Valentine is a 70 y.o. female who is being seen today for the evaluation of pacemaker at the request of Forrest Moron, MD. Presenting today for electrophysiology evaluation.  She has a history significant of paroxysmal atrial fibrillation, sick sinus syndrome status post Saint Jude dual-chamber pacemaker, coronary artery disease status post CABG, hypertension, renal artery stenosis, hyperlipidemia.  She had cryoablation in 2019.  She had repeat ablation 03/22/2019.  Unfortunately she went back into atrial fibrillation and is now status post repeat ablation 04/05/2020.  Today, denies symptoms of palpitations, chest pain, shortness of breath, orthopnea, PND, lower extremity edema, claudication, dizziness, presyncope, syncope, bleeding, or neurologic sequela. The patient is tolerating medications without difficulties.   Overall she is doing well.  She has no chest pain or shortness of breath.  She is able to do all of her daily activities without restriction.  She is noted no further episodes of atrial fibrillation.  She is happy with her control.  Past Medical History:  Diagnosis Date  . CAD in native artery 01/02/2019  . Carotid stenosis, bilateral 01/02/2019  . Essential hypertension 01/02/2019  . History of amiodarone therapy 01/02/2019  . Hx of CABG 01/02/2019  . Hyperlipidemia 01/02/2019  . PAF (paroxysmal atrial fibrillation) (HCC) 01/02/2019  . Renal artery stenosis (HCC) 01/02/2019  . Sick sinus syndrome (HCC) 01/02/2019   Past Surgical History:  Procedure Laterality Date  . ATRIAL FIBRILLATION ABLATION N/A 03/22/2019   Procedure: ATRIAL FIBRILLATION ABLATION;  Surgeon: Regan Lemming, MD;  [facility]: [facility]  IN[facility]IVE [facility];  Service: Cardiovascular;  Laterality: N/A;  . ATRIAL FIBRILLATION ABLATION N/A 04/04/2020   Procedure: ATRIAL FIBRILLATION ABLATION;  Surgeon: Regan Lemming, MD;  Location: [facility] INVASIVE CV [facility];  Service: Cardiovascular;  Laterality: N/A;  . BOWEL RESECTION    . CARDIOVERSION    . CORONARY ANGIOPLASTY WITH STENT PLACEMENT    . CORONARY ARTERY BYPASS GRAFT  2016   x 3 vessels  . CRYOABLATION    . INSERT / REPLACE / REMOVE PACEMAKER       Current Outpatient Medications  Medication Sig Dispense Refill  . acetaminophen (TYLENOL) 500 MG tablet Take 1,000 mg by mouth every 6 (six) hours as needed for moderate pain or headache.    . albuterol (VENTOLIN HFA) 108 (90 Base) MCG/ACT inhaler Inhale 2 puffs into the lungs every 6 (six) hours as needed for wheezing or shortness of breath.     . allopurinol (ZYLOPRIM) 100 MG tablet Take 100 mg by mouth daily.    Marland Kitchen ALPRAZolam (XANAX) 0.25 MG tablet Take 0.125-0.25 mg by mouth at bedtime.    Marland Kitchen aspirin EC 81 MG tablet Take 81 mg by mouth daily.     Marland Kitchen atorvastatin (LIPITOR) 40 MG tablet Take 40 mg by mouth daily.     . Biotin 5000 MCG CAPS Take 5,000 mcg by mouth daily.    . carvedilol (COREG) 12.5 MG tablet Take 1 tablet (12.5 mg total) by mouth 2 (two) times daily. 180 tablet 3  . cloNIDine (CATAPRES) 0.1 MG tablet Take 0.1 mg by mouth 2 (two) times daily.    Marland Kitchen ELIQUIS 5 MG TABS tablet TAKE 1 TABLET BY MOUTH TWICE A DAY  180 tablet 1  . furosemide (LASIX) 40 MG tablet Take 20-40 mg by mouth See admin instructions. Take 40 mg in the morning and 20 mg in the evening    . levothyroxine (SYNTHROID) 75 MCG tablet Take 75 mcg by mouth daily before breakfast.     . losartan (COZAAR) 100 MG tablet Take 100 mg by mouth daily.    . nitroGLYCERIN (NITROSTAT) 0.4 MG SL tablet Place 0.4 mg under the tongue every 5 (five) minutes x 3 doses as needed for chest pain.     . potassium chloride (KLOR-CON) 10 MEQ tablet Take 10 mEq by mouth every other  day.    . pramipexole (MIRAPEX) 0.25 MG tablet Take 0.25 mg by mouth every evening.     . saline (AYR) GEL Place 1 application into the nose daily as needed (dryness).    . SOTALOL AF 80 MG TABS TAKE 1 TABLET (80 MG TOTAL) BY MOUTH 2 (TWO) TIMES DAILY. 180 tablet 3  . vitamin B-12 (CYANOCOBALAMIN) 1000 MCG tablet Take 1,000 mcg by mouth daily.     No current facility-administered medications for this visit.    Allergies:   Tizanidine, Ace inhibitors, Hydrocodone bit-homatrop mbr, and Rofecoxib   Social History:  The patient  reports that she quit smoking about 13 years ago. Her smoking use included cigarettes. She has a 33.75 pack-year smoking history. She has never used smokeless tobacco. She reports current alcohol use of about 2.0 standard drinks of alcohol per week. She reports that she does not use drugs.   Family History:  The patient's family history includes Heart attack (age of onset: 43) in her father; Hypertension in her daughter, mother, sister, sister, and son.   ROS:  Please see the history of present illness.   Otherwise, review of systems is positive for none.   All other systems are reviewed and negative.   PHYSICAL EXAM: VS:  BP (!) 160/98   Pulse 60   Ht 5\' 6"  (1.676 m)   Wt 184 lb (83.5 kg)   BMI 29.70 kg/m  , BMI Body mass index is 29.7 kg/m. GEN: Well nourished, well developed, in no acute distress  HEENT: normal  Neck: no JVD, carotid bruits, or masses Cardiac: RRR; no murmurs, rubs, or gallops,no edema  Respiratory:  clear to auscultation bilaterally, normal work of breathing GI: soft, nontender, nondistended, + BS MS: no deformity or atrophy  Skin: warm and dry, device site well healed Neuro:  Strength and sensation are intact Psych: euthymic mood, full affect  EKG:  EKG is ordered today. Personal review of the ekg ordered shows AV paced  Personal review of the device interrogation today. Results in [facility]   Recent Labs: 03/28/2020: Hemoglobin 14.1;  Platelets 134 03/30/2020: BUN 28; Creatinine, Ser 1.45; Potassium 4.2; Sodium 132    Lipid Panel     Component Value Date/Time   CHOL 167 01/03/2019 1713   TRIG 133 01/03/2019 1713   HDL 61 01/03/2019 1713   CHOLHDL 2.7 01/03/2019 1713   LDLCALC 79 01/03/2019 1713     Wt Readings from Last 3 Encounters:  10/29/20 184 lb (83.5 kg)  07/23/20 188 lb (85.3 kg)  05/18/20 188 lb (85.3 kg)      Other studies Reviewed: Additional studies/ records that were reviewed today include: TTE 01/10/19  Review of the above records today demonstrates:   1. Mild hypokinesis of the left ventricular, basal-mid inferoseptal wall.  2. The left ventricle has low normal systolic function,  with an ejection fraction of 50-55%. The cavity size was normal. Left ventricular diastolic Doppler parameters are consistent with pseudonormalization.  3. The right ventricle has normal systolic function. The cavity was normal. There is no increase in right ventricular wall thickness.  4. Left atrial size was mildly dilated.  5. Mild thickening of the aortic valve. Mild calcification of the aortic valve. Aortic valve regurgitation was not assessed by color flow Doppler.  6. The aorta is normal in size and structure.   ASSESSMENT AND PLAN:  1.  Sick sinus syndrome: Status post Saint Jude dual-chamber pacemaker.  Device functioning appropriately.  She is AV pacing today.  We Vickii Volland switch her to [facility]. 2.  Paroxysmal atrial fibrillation: Status post cryoablation with repeat ablation 03/22/2019.  Unfortunately had more episodes of atrial fibrillation.  Currently on sotalol and Eliquis.  CHA2DS2-VASc of 4.  High risk medication monitoring.  Now status post repeat ablation 04/05/2020.  Remains in sinus rhythm.  No changes at this time.  3.  Hypertension: Blood pressure elevated today.  Has been normal on every other checks.  No changes.  4.  Hyperlipidemia: Continue statin per primary cardiology   Current medicines are  reviewed at length with the patient today.   The patient does not have concerns regarding her medicines.  The following changes were made today: None  Labs/ tests ordered today include:  Orders Placed This Encounter  Procedures  . EKG 12-Lead    Disposition:   FU with Korey Arroyo 6 months  Signed, Olvin Rohr Jorja Loa, MD  10/29/2020 3:11 PM     [facility] [address] [place] [address] [phone] (office) [phone] (fax)

## 2020-11-14 NOTE — Telephone Encounter (Signed)
Spoke to pt and husband over speaker phone. Aware Dr. Elberta Fortis more than happy to see husband for second opinion. Diane Valentine scheduled for 6/13. Wife reports that they don't have much faith in [facility] and not sure they will return.  They still haven't heard back from cardiology after they called yesterday. They both truly appreciate to help with this matter.

## 2020-11-20 ENCOUNTER — Ambulatory Visit (INDEPENDENT_AMBULATORY_CARE_PROVIDER_SITE_OTHER): Payer: Medicare Other | Admitting: Cardiology

## 2020-11-20 ENCOUNTER — Other Ambulatory Visit: Payer: Self-pay

## 2020-11-20 ENCOUNTER — Encounter: Payer: Self-pay | Admitting: Cardiology

## 2020-11-20 VITALS — BP 122/80 | HR 60 | Ht 65.0 in | Wt 185.0 lb

## 2020-11-20 DIAGNOSIS — E669 Obesity, unspecified: Secondary | ICD-10-CM

## 2020-11-20 DIAGNOSIS — I495 Sick sinus syndrome: Secondary | ICD-10-CM

## 2020-11-20 DIAGNOSIS — I2510 Atherosclerotic heart disease of native coronary artery without angina pectoris: Secondary | ICD-10-CM

## 2020-11-20 DIAGNOSIS — I701 Atherosclerosis of renal artery: Secondary | ICD-10-CM

## 2020-11-20 DIAGNOSIS — Z951 Presence of aortocoronary bypass graft: Secondary | ICD-10-CM

## 2020-11-20 DIAGNOSIS — Z8679 Personal history of other diseases of the circulatory system: Secondary | ICD-10-CM

## 2020-11-20 DIAGNOSIS — I10 Essential (primary) hypertension: Secondary | ICD-10-CM | POA: Diagnosis not present

## 2020-11-20 DIAGNOSIS — I480 Paroxysmal atrial fibrillation: Secondary | ICD-10-CM

## 2020-11-20 DIAGNOSIS — Z9889 Other specified postprocedural states: Secondary | ICD-10-CM

## 2020-11-20 HISTORY — DX: Obesity, unspecified: E66.9

## 2020-11-20 NOTE — Patient Instructions (Signed)

## 2020-11-20 NOTE — Progress Notes (Signed)
Cardiology Office Note:    Date:  11/20/2020   ID:  Diane Valentine, DOB July 19, 1950, MRN [id number]  PCP:  Forrest Moron, MD  Cardiologist:  Thomasene Ripple, DO  Electrophysiologist:  Regan Lemming, MD   Referring MD: Forrest Moron, MD   " I am doing better"  History of Present Illness:    Diane Valentine is a 70 y.o. female with a hx of SSS s/p PPM, CAD s/p CABG, HTN, renal artery stenosis, HLD, atrial flutter and atrial fibrillation status post ablation with most recent in 03/2020 and back in 2019 who presents for follow  Up visit. He is here today for a follow up visit.   I last saw the patient on May 18, 2020 at that time she was doing well from a cardiovascular standpoint.  We will continue all her medications.  Is also a patient she had seen Dr. Elberta Fortis which was on Oct 30, 2019 at that time she appeared to be doing well from a cardiovascular standpoint.  She was in sinus rhythm no changes were made to her medicines at that time.   Past Medical History:  Diagnosis Date  . CAD in native artery 01/02/2019  . Carotid stenosis, bilateral 01/02/2019  . Essential hypertension 01/02/2019  . History of amiodarone therapy 01/02/2019  . Hx of CABG 01/02/2019  . Hyperlipidemia 01/02/2019  . PAF (paroxysmal atrial fibrillation) (HCC) 01/02/2019  . Renal artery stenosis (HCC) 01/02/2019  . Sick sinus syndrome (HCC) 01/02/2019    Past Surgical History:  Procedure Laterality Date  . ATRIAL FIBRILLATION ABLATION N/A 03/22/2019   Procedure: ATRIAL FIBRILLATION ABLATION;  Surgeon: Regan Lemming, MD;  Location: [facility] INVASIVE CV LAB;  Service: Cardiovascular;  Laterality: N/A;  . ATRIAL FIBRILLATION ABLATION N/A 04/04/2020   Procedure: ATRIAL FIBRILLATION ABLATION;  Surgeon: Regan Lemming, MD;  Location: [facility] CV [facility];  Service: Cardiovascular;  Laterality: N/A;  . BOWEL RESECTION    . CARDIOVERSION    . CORONARY ANGIOPLASTY WITH STENT PLACEMENT    . CORONARY ARTERY BYPASS  GRAFT  2016   x 3 vessels  . CRYOABLATION    . INSERT / REPLACE / REMOVE PACEMAKER      Current Medications: Current Meds  Medication Sig  . acetaminophen (TYLENOL) 500 MG tablet Take 1,000 mg by mouth every 6 (six) hours as needed for moderate pain or headache.  . albuterol (VENTOLIN HFA) 108 (90 Base) MCG/ACT inhaler Inhale 2 puffs into the lungs every 6 (six) hours as needed for wheezing or shortness of breath.   . allopurinol (ZYLOPRIM) 100 MG tablet Take 100 mg by mouth daily.  Marland Kitchen ALPRAZolam (XANAX) 0.25 MG tablet Take 0.125-0.25 mg by mouth at bedtime.  Marland Kitchen aspirin EC 81 MG tablet Take 81 mg by mouth daily.   Marland Kitchen atorvastatin (LIPITOR) 40 MG tablet Take 40 mg by mouth daily.   . Biotin 5000 MCG CAPS Take 5,000 mcg by mouth daily.  . carvedilol (COREG) 12.5 MG tablet Take 1 tablet (12.5 mg total) by mouth 2 (two) times daily.  . cloNIDine (CATAPRES) 0.1 MG tablet Take 0.1 mg by mouth 2 (two) times daily.  Marland Kitchen ELIQUIS 5 MG TABS tablet TAKE 1 TABLET BY MOUTH TWICE A DAY  . furosemide (LASIX) 40 MG tablet Take 20-40 mg by mouth See admin instructions. Take 40 mg in the morning and 20 mg in the evening  . levothyroxine (SYNTHROID) 75 MCG tablet Take 75 mcg by mouth daily before breakfast.   . losartan (  COZAAR) 100 MG tablet Take 100 mg by mouth daily.  . nitroGLYCERIN (NITROSTAT) 0.4 MG SL tablet Place 0.4 mg under the tongue every 5 (five) minutes x 3 doses as needed for chest pain.   . potassium chloride (KLOR-CON) 10 MEQ tablet Take 10 mEq by mouth every other day.  . pramipexole (MIRAPEX) 0.25 MG tablet Take 0.25 mg by mouth every evening.   . saline (AYR) GEL Place 1 application into the nose daily as needed (dryness).  . SOTALOL AF 80 MG TABS TAKE 1 TABLET (80 MG TOTAL) BY MOUTH 2 (TWO) TIMES DAILY.  . vitamin B-12 (CYANOCOBALAMIN) 1000 MCG tablet Take 1,000 mcg by mouth daily.     Allergies:   Tizanidine, Ace inhibitors, Hydrocodone bit-homatrop mbr, and Rofecoxib   Social History    Socioeconomic History  . Marital status: Married    Spouse name: Not on file  . Number of children: Not on file  . Years of education: Not on file  . Highest education level: Not on file  Occupational History  . Not on file  Tobacco Use  . Smoking status: Former Smoker    Packs/day: 0.75    Years: 45.00    Pack years: 33.75    Types: Cigarettes    Quit date: 2009    Years since quitting: 13.4  . Smokeless tobacco: Never Used  Vaping Use  . Vaping Use: Never used  Substance and Sexual Activity  . Alcohol use: Yes    Alcohol/week: 2.0 standard drinks    Types: 2 Cans of beer per week    Comment: 2 beers daily  . Drug use: Never  . Sexual activity: Not on file  Other Topics Concern  . Not on file  Social History Narrative  . Not on file   Social Determinants of Health   Financial Resource Strain: Not on file  Food Insecurity: Not on file  Transportation Needs: Not on file  Physical Activity: Not on file  Stress: Not on file  Social Connections: Not on file     Family History: The patient's family history includes Heart attack (age of onset: 26) in her father; Hypertension in her daughter, mother, sister, sister, and son.  ROS:   Review of Systems  Constitution: Negative for decreased appetite, fever and weight gain.  HENT: Negative for congestion, ear discharge, hoarse voice and sore throat.   Eyes: Negative for discharge, redness, vision loss in right eye and visual halos.  Cardiovascular: Negative for chest pain, dyspnea on exertion, leg swelling, orthopnea and palpitations.  Respiratory: Negative for cough, hemoptysis, shortness of breath and snoring.   Endocrine: Negative for heat intolerance and polyphagia.  Hematologic/Lymphatic: Negative for bleeding problem. Does not bruise/bleed easily.  Skin: Negative for flushing, nail changes, rash and suspicious lesions.  Musculoskeletal: Negative for arthritis, joint pain, muscle cramps, myalgias, neck pain and  stiffness.  Gastrointestinal: Negative for abdominal pain, bowel incontinence, diarrhea and excessive appetite.  Genitourinary: Negative for decreased libido, genital sores and incomplete emptying.  Neurological: Negative for brief paralysis, focal weakness, headaches and loss of balance.  Psychiatric/Behavioral: Negative for altered mental status, depression and suicidal ideas.  Allergic/Immunologic: Negative for HIV exposure and persistent infections.    EKGs/Labs/Other Studies Reviewed:    The following studies were reviewed today:   EKG: None today  Echo 01/10/19 demonstrated  1. Mild hypokinesis of the left ventricular, basal-mid inferoseptal wall. 2. The left ventricle has low normal systolic function, with an ejection fraction of 50-55%. The  cavity size was normal. Left ventricular diastolic Doppler parameters are consistent with pseudonormalization. 3. The right ventricle has normal systolic function. The cavity was normal. There is no increase in right ventricular wall thickness. 4. Left atrial size was mildly dilated. 5. Mild thickening of the aortic valve. Mild calcification of the aortic valve. Aortic valve regurgitation was not assessed by color flow Doppler. 6. The aorta is normal in size and structure.   Recent Labs: 03/28/2020: Hemoglobin 14.1; Platelets 134 03/30/2020: BUN 28; Creatinine, Ser 1.45; Potassium 4.2; Sodium 132  Recent Lipid Panel    Component Value Date/Time   CHOL 167 01/03/2019 1713   TRIG 133 01/03/2019 1713   HDL 61 01/03/2019 1713   CHOLHDL 2.7 01/03/2019 1713   LDLCALC 79 01/03/2019 1713    Physical Exam:    VS:  BP 122/80   Pulse 60   Ht 5\' 5"  (1.651 m)   Wt 185 lb (83.9 kg)   SpO2 95%   BMI 30.79 kg/m     Wt Readings from Last 3 Encounters:  11/20/20 185 lb (83.9 kg)  10/29/20 184 lb (83.5 kg)  07/23/20 188 lb (85.3 kg)     GEN: Well nourished, well developed in no acute distress HEENT: Normal NECK: No JVD; No carotid  bruits LYMPHATICS: No lymphadenopathy CARDIAC: S1S2 noted,RRR, no murmurs, rubs, gallops RESPIRATORY:  Clear to auscultation without rales, wheezing or rhonchi  ABDOMEN: Soft, non-tender, non-distended, +bowel sounds, no guarding. EXTREMITIES: No edema, No cyanosis, no clubbing MUSCULOSKELETAL:  No deformity  SKIN: Warm and dry NEUROLOGIC:  Alert and oriented x 3, non-focal PSYCHIATRIC:  Normal affect, good insight  ASSESSMENT:    1. CAD in native artery   2. Essential hypertension   3. Renal artery stenosis (HCC)   4. Sick sinus syndrome (HCC)   5. PAF (paroxysmal atrial fibrillation) (HCC)   6. S/P CABG (coronary artery bypass graft)   7. S/P ablation of atrial fibrillation   8. Obesity (BMI 30-39.9)    PLAN:     She is doing well from a cardiovascular standpoint.  No changes will be made to her medication regimen.  Reviewed her most recent pacemaker interrogation.  Blood pressure is acceptable, continue with current antihypertensive regimen.  The patient understands the need to lose weight with diet and exercise. We have discussed specific strategies for this.  No angina symptoms continue her current medication regimen.  The patient is in agreement with the above plan. The patient left the office in stable condition.  The patient will follow up in   Medication Adjustments/Labs and Tests Ordered: Current medicines are reviewed at length with the patient today.  Concerns regarding medicines are outlined above.  No orders of the defined types were placed in this encounter.  No orders of the defined types were placed in this encounter.   Patient Instructions  Medication Instructions:  Your physician recommends that you continue on your current medications as directed. Please refer to the Current Medication list given to you today.  *If you need a refill on your cardiac medications before your next appointment, please call your pharmacy*   Lab Work: None If you have  labs (blood work) drawn today and your tests are completely normal, you will receive your results only by: 09/20/20 MyChart Message (if you have MyChart) OR . A paper copy in the mail If you have any lab test that is abnormal or we need to change your treatment, we will call you to review the results.  Testing/Procedures: None   Follow-Up: At [facility], you and your health needs are our priority.  As part of our continuing mission to provide you with exceptional heart care, we have created designated Provider Care Teams.  These Care Teams include your primary Cardiologist (physician) and Advanced Practice Providers (APPs -  Physician Assistants and Nurse Practitioners) who all work together to provide you with the care you need, when you need it.  We recommend signing up for the patient portal called "MyChart".  Sign up information is provided on this After Visit Summary.  MyChart is used to connect with patients for Virtual Visits (Telemedicine).  Patients are able to view lab/test results, encounter notes, upcoming appointments, etc.  Non-urgent messages can be sent to your provider as well.   To learn more about what you can do with MyChart, go to [email or web address].    Your next appointment:   1 year(s)  The format for your next appointment:   In Person  Provider:   Thomasene RippleKardie Alpa Salvo, DO   Other Instructions      Adopting a Healthy Lifestyle.  Know what a healthy weight is for you (roughly BMI <25) and aim to maintain this   Aim for 7+ servings of fruits and vegetables daily   65-80+ fluid ounces of water or unsweet tea for healthy kidneys   Limit to max 1 drink of alcohol per day; avoid smoking/tobacco   Limit animal fats in diet for cholesterol and heart health - choose grass fed whenever available   Avoid highly processed foods, and foods high in saturated/trans fats   Aim for low stress - take time to unwind and care for your mental health   Aim for 150 min of  moderate intensity exercise weekly for heart health, and weights twice weekly for bone health   Aim for 7-9 hours of sleep daily   When it comes to diets, agreement about the perfect plan isnt easy to find, even among the experts. Experts at the [employer] developed an idea known as the Healthy Eating Plate. Just imagine a plate divided into logical, healthy portions.   The emphasis is on diet quality:   Load up on vegetables and fruits - one-half of your plate: Aim for color and variety, and remember that potatoes dont count.   Go for whole grains - one-quarter of your plate: Whole wheat, barley, wheat berries, quinoa, oats, brown rice, and foods made with them. If you want pasta, go with whole wheat pasta.   Protein power - one-quarter of your plate: Fish, chicken, beans, and nuts are all healthy, versatile protein sources. Limit red meat.   The diet, however, does go beyond the plate, offering a few other suggestions.   Use healthy plant oils, such as olive, canola, soy, corn, sunflower and peanut. Check the labels, and avoid partially hydrogenated oil, which have unhealthy trans fats.   If youre thirsty, drink water. Coffee and tea are good in moderation, but skip sugary drinks and limit milk and dairy products to one or two daily servings.   The type of carbohydrate in the diet is more important than the amount. Some sources of carbohydrates, such as vegetables, fruits, whole grains, and beans-are healthier than others.   Finally, stay active  Signed, Thomasene RippleKardie Leslye Puccini, DO  11/20/2020 1:41 PM    [facility]

## 2020-12-05 ENCOUNTER — Ambulatory Visit (INDEPENDENT_AMBULATORY_CARE_PROVIDER_SITE_OTHER): Payer: Medicare Other

## 2020-12-05 DIAGNOSIS — I495 Sick sinus syndrome: Secondary | ICD-10-CM

## 2020-12-05 LAB — CUP PACEART REMOTE DEVICE CHECK
Battery Remaining Longevity: 94 mo
Battery Remaining Percentage: 72 %
Battery Voltage: 2.99 V
Brady Statistic AP VP Percent: 31 %
Brady Statistic AP VS Percent: 6.6 %
Brady Statistic AS VP Percent: 16 %
Brady Statistic AS VS Percent: 46 %
Brady Statistic RA Percent Paced: 37 %
Brady Statistic RV Percent Paced: 47 %
Date Time Interrogation Session: 20220622020013
Implantable Lead Implant Date: 20190909
Implantable Lead Implant Date: 20190909
Implantable Lead Location: 753859
Implantable Lead Location: 753860
Implantable Pulse Generator Implant Date: 20190909
Lead Channel Impedance Value: 400 Ohm
Lead Channel Impedance Value: 480 Ohm
Lead Channel Pacing Threshold Amplitude: 0.875 V
Lead Channel Pacing Threshold Amplitude: 0.875 V
Lead Channel Pacing Threshold Pulse Width: 0.4 ms
Lead Channel Pacing Threshold Pulse Width: 0.4 ms
Lead Channel Sensing Intrinsic Amplitude: 0.5 mV
Lead Channel Sensing Intrinsic Amplitude: 12 mV
Lead Channel Setting Pacing Amplitude: 1.125
Lead Channel Setting Pacing Amplitude: 1.875
Lead Channel Setting Pacing Pulse Width: 0.4 ms
Lead Channel Setting Sensing Sensitivity: 2 mV
Pulse Gen Model: 2272
Pulse Gen Serial Number: 9053085

## 2020-12-25 NOTE — Progress Notes (Signed)
Remote pacemaker transmission.   

## 2021-01-23 ENCOUNTER — Other Ambulatory Visit (HOSPITAL_COMMUNITY): Payer: Self-pay | Admitting: Cardiology

## 2021-01-23 NOTE — Telephone Encounter (Signed)
Prescription refill request for Eliquis received. Indication:atrial fib Last office visit:6/22 Scr:1.4 Age: 70 Weight:83.9 kg  Prescription refilled

## 2021-01-30 ENCOUNTER — Telehealth: Payer: Self-pay

## 2021-01-30 NOTE — Telephone Encounter (Signed)
Merlin alert for AT/AF Burden 4.7%, pt prescribed Eliquis, Coreg, Sotalol Pt is PVI x2, last 03/2020 Presenting is AF, CVR ongoing episode starting 8/16.  Additional 24 AMS for AF, longest 16hr. High atrial output secondary to rhythm. Forward to triage for recurrence of AF. LR           Remote transmission reviewed. Successful telephone encounter to patient to assess for s/s of redcurrant AF onset. Patient states she feels well. No symptoms of AF. Admits to additional stress over the last 2 weeks secondary to her husbands health and recent procedure. Also admits to not sleeping well secondary to restless leg. Prescribed xanax but states it has stopped working for her anxiety.   Assisted patient with manual transmission. Patient remains in AF. Patient noted to be in Atrial high output mode secondary to AF. She has agreed to device clinic appointment 01/31/21 at 4:00 pm to have ACap Confirm turned off. Patient continues to be compliant with all her medications including Coreg, eliquis, lasix, and sotolol. Will route to Dr. Elberta Fortis for review.      Current EGM 01/30/21 at 1300

## 2021-01-30 NOTE — Telephone Encounter (Signed)
Incoming call from patient. States she failed to mention she has recently been prescribed prednisone dose pack for orthopedic back pain. Patient started medication Friday 01/25/21 and noticed restlessness and elevated heart rate began 24 yours after starting medications. Patient took last does for steroid this am. RN thanked patient for additional information. Will continue to monitor.

## 2021-01-31 ENCOUNTER — Other Ambulatory Visit: Payer: Self-pay

## 2021-01-31 ENCOUNTER — Ambulatory Visit (INDEPENDENT_AMBULATORY_CARE_PROVIDER_SITE_OTHER): Payer: Medicare Other

## 2021-01-31 DIAGNOSIS — I495 Sick sinus syndrome: Secondary | ICD-10-CM | POA: Diagnosis not present

## 2021-01-31 LAB — CUP PACEART INCLINIC DEVICE CHECK
Battery Remaining Longevity: 87 mo
Battery Voltage: 2.99 V
Brady Statistic RA Percent Paced: 38 %
Brady Statistic RV Percent Paced: 50 %
Date Time Interrogation Session: 20220818161953
Implantable Lead Implant Date: 20190909
Implantable Lead Implant Date: 20190909
Implantable Lead Location: 753859
Implantable Lead Location: 753860
Implantable Pulse Generator Implant Date: 20190909
Lead Channel Impedance Value: 400 Ohm
Lead Channel Impedance Value: 462.5 Ohm
Lead Channel Pacing Threshold Amplitude: 0 V
Lead Channel Pacing Threshold Amplitude: 0.75 V
Lead Channel Pacing Threshold Pulse Width: 0.4 ms
Lead Channel Pacing Threshold Pulse Width: 0.4 ms
Lead Channel Sensing Intrinsic Amplitude: 0.5 mV
Lead Channel Sensing Intrinsic Amplitude: 12 mV
Lead Channel Setting Pacing Amplitude: 1 V
Lead Channel Setting Pacing Amplitude: 2 V
Lead Channel Setting Pacing Pulse Width: 0.4 ms
Lead Channel Setting Sensing Sensitivity: 2 mV
Pulse Gen Model: 2272
Pulse Gen Serial Number: 9053085

## 2021-01-31 NOTE — Patient Instructions (Signed)
Please send a manual transmission from your remote monitor on either Monday or Tuesday of next week.

## 2021-01-31 NOTE — Progress Notes (Signed)
Pacemaker check in clinic to turn off RA Cap confirm due to elevated outputs.   Normal device function. Thresholds, sensing, impedances consistent with previous measurements. Unable to test RA threshold due to AF, previous measurements were 0.75v@ 0.90ms.  At interrogation device still running in high output mode.  Auto Capconfirm programmed off and output set to 2.0V@ 0.48ms.   Device programmed to maximize longevity. Current AF Burden 6.3%, ongoing AF episode for 3 days now.  V rates are controlled.  +OAC.  No high ventricular rates noted. Device programmed at appropriate safety margins. Histogram distribution appropriate for patient activity level. Device programmed to optimize intrinsic conduction. Estimated longevity 7 years. Patient enrolled in remote follow-up, next scheduled check 03/06/21.  Pt to send f/u transmission next week. Patient education completed.

## 2021-02-05 ENCOUNTER — Telehealth: Payer: Self-pay

## 2021-02-05 NOTE — Telephone Encounter (Signed)
Pt sent follow-up transmission- Continues in AF.  V rates controlled overall.  AF Burden now at >99%.  Pt is compliant with meds as ordered including Sotalol 80mg  BID, Carvedilol 12.5mg  BID, eliquis 5 mg BID.    Pt has been off of the prednisone for at least 5 days now.    Pt indicates she is very fatigued, all she wants to do is lie in bed.  She reports she is monitoring HR and BP closely and has has some low BP results over the past coupe days.  SBP ranging from 75-100.    Pt agreeable to AF clinic follow-up, she would prefer Virtual visit since she usually sees Dr. in [place].

## 2021-02-05 NOTE — Telephone Encounter (Signed)
-----   Message from Will Jorja Loa, MD sent at 02/05/2021 12:14 PM EDT ----- In clinic interrogation reviewed. Battery and lead parameters stable. Needs remote in 7days. If still in AF needs AF clinic.

## 2021-02-05 NOTE — Telephone Encounter (Signed)
Called and spoke with patient, she is agreeable to appt 02/07/21 with [facility].

## 2021-02-07 ENCOUNTER — Emergency Department (HOSPITAL_BASED_OUTPATIENT_CLINIC_OR_DEPARTMENT_OTHER): Payer: Medicare Other

## 2021-02-07 ENCOUNTER — Ambulatory Visit (HOSPITAL_BASED_OUTPATIENT_CLINIC_OR_DEPARTMENT_OTHER)
Admission: RE | Admit: 2021-02-07 | Discharge: 2021-02-07 | Disposition: A | Discharge status: Home / Self Care | Payer: Medicare Other | Source: Ambulatory Visit | Attending: Physician Assistant | Admitting: Physician Assistant

## 2021-02-07 ENCOUNTER — Other Ambulatory Visit: Payer: Self-pay

## 2021-02-07 ENCOUNTER — Emergency Department (HOSPITAL_BASED_OUTPATIENT_CLINIC_OR_DEPARTMENT_OTHER)
Admission: EM | Admit: 2021-02-07 | Discharge: 2021-02-07 | Disposition: A | Discharge status: Home / Self Care | Payer: Medicare Other | Attending: Emergency Medicine | Admitting: Emergency Medicine

## 2021-02-07 ENCOUNTER — Encounter (HOSPITAL_BASED_OUTPATIENT_CLINIC_OR_DEPARTMENT_OTHER): Payer: Self-pay | Admitting: *Deleted

## 2021-02-07 ENCOUNTER — Encounter (HOSPITAL_COMMUNITY): Payer: Self-pay | Admitting: Physician Assistant

## 2021-02-07 VITALS — BP 94/70 | HR 74 | Ht 65.0 in | Wt 184.8 lb

## 2021-02-07 DIAGNOSIS — Z20822 Contact with and (suspected) exposure to covid-19: Secondary | ICD-10-CM | POA: Diagnosis not present

## 2021-02-07 DIAGNOSIS — Z7982 Long term (current) use of aspirin: Secondary | ICD-10-CM | POA: Insufficient documentation

## 2021-02-07 DIAGNOSIS — Z886 Allergy status to analgesic agent status: Secondary | ICD-10-CM | POA: Insufficient documentation

## 2021-02-07 DIAGNOSIS — I10 Essential (primary) hypertension: Secondary | ICD-10-CM | POA: Insufficient documentation

## 2021-02-07 DIAGNOSIS — Z951 Presence of aortocoronary bypass graft: Secondary | ICD-10-CM | POA: Insufficient documentation

## 2021-02-07 DIAGNOSIS — E785 Hyperlipidemia, unspecified: Secondary | ICD-10-CM | POA: Insufficient documentation

## 2021-02-07 DIAGNOSIS — I2510 Atherosclerotic heart disease of native coronary artery without angina pectoris: Secondary | ICD-10-CM | POA: Insufficient documentation

## 2021-02-07 DIAGNOSIS — Z79899 Other long term (current) drug therapy: Secondary | ICD-10-CM | POA: Insufficient documentation

## 2021-02-07 DIAGNOSIS — R200 Anesthesia of skin: Secondary | ICD-10-CM | POA: Diagnosis not present

## 2021-02-07 DIAGNOSIS — D649 Anemia, unspecified: Secondary | ICD-10-CM | POA: Insufficient documentation

## 2021-02-07 DIAGNOSIS — Z87891 Personal history of nicotine dependence: Secondary | ICD-10-CM | POA: Diagnosis not present

## 2021-02-07 DIAGNOSIS — E039 Hypothyroidism, unspecified: Secondary | ICD-10-CM | POA: Insufficient documentation

## 2021-02-07 DIAGNOSIS — I4510 Unspecified right bundle-branch block: Secondary | ICD-10-CM | POA: Insufficient documentation

## 2021-02-07 DIAGNOSIS — I4819 Other persistent atrial fibrillation: Secondary | ICD-10-CM | POA: Insufficient documentation

## 2021-02-07 DIAGNOSIS — R799 Abnormal finding of blood chemistry, unspecified: Secondary | ICD-10-CM | POA: Diagnosis present

## 2021-02-07 DIAGNOSIS — I495 Sick sinus syndrome: Secondary | ICD-10-CM | POA: Insufficient documentation

## 2021-02-07 DIAGNOSIS — I700 Atherosclerosis of aorta: Secondary | ICD-10-CM | POA: Insufficient documentation

## 2021-02-07 DIAGNOSIS — Z7901 Long term (current) use of anticoagulants: Secondary | ICD-10-CM | POA: Insufficient documentation

## 2021-02-07 DIAGNOSIS — I4892 Unspecified atrial flutter: Secondary | ICD-10-CM | POA: Insufficient documentation

## 2021-02-07 DIAGNOSIS — Z79890 Hormone replacement therapy (postmenopausal): Secondary | ICD-10-CM | POA: Insufficient documentation

## 2021-02-07 DIAGNOSIS — Z888 Allergy status to other drugs, medicaments and biological substances status: Secondary | ICD-10-CM | POA: Insufficient documentation

## 2021-02-07 DIAGNOSIS — Z950 Presence of cardiac pacemaker: Secondary | ICD-10-CM | POA: Insufficient documentation

## 2021-02-07 DIAGNOSIS — Z6830 Body mass index (BMI) 30.0-30.9, adult: Secondary | ICD-10-CM | POA: Insufficient documentation

## 2021-02-07 DIAGNOSIS — D6869 Other thrombophilia: Secondary | ICD-10-CM

## 2021-02-07 DIAGNOSIS — E669 Obesity, unspecified: Secondary | ICD-10-CM | POA: Insufficient documentation

## 2021-02-07 DIAGNOSIS — Z8249 Family history of ischemic heart disease and other diseases of the circulatory system: Secondary | ICD-10-CM | POA: Insufficient documentation

## 2021-02-07 LAB — CBC WITH DIFFERENTIAL/PLATELET
Abs Immature Granulocytes: 0.01 10*3/uL (ref 0.00–0.07)
Basophils Absolute: 0 10*3/uL (ref 0.0–0.1)
Basophils Relative: 1 %
Eosinophils Absolute: 0.1 10*3/uL (ref 0.0–0.5)
Eosinophils Relative: 2 %
HCT: 26 % — ABNORMAL LOW (ref 36.0–46.0)
Hemoglobin: 7.9 g/dL — ABNORMAL LOW (ref 12.0–15.0)
Immature Granulocytes: 0 %
Lymphocytes Relative: 17 %
Lymphs Abs: 1 10*3/uL (ref 0.7–4.0)
MCH: 26.7 pg (ref 26.0–34.0)
MCHC: 30.4 g/dL (ref 30.0–36.0)
MCV: 87.8 fL (ref 80.0–100.0)
Monocytes Absolute: 0.7 10*3/uL (ref 0.1–1.0)
Monocytes Relative: 11 %
Neutro Abs: 4.2 10*3/uL (ref 1.7–7.7)
Neutrophils Relative %: 69 %
Platelets: 290 10*3/uL (ref 150–400)
RBC: 2.96 MIL/uL — ABNORMAL LOW (ref 3.87–5.11)
RDW: 18.2 % — ABNORMAL HIGH (ref 11.5–15.5)
WBC: 6.1 10*3/uL (ref 4.0–10.5)
nRBC: 0.3 % — ABNORMAL HIGH (ref 0.0–0.2)

## 2021-02-07 LAB — BASIC METABOLIC PANEL
Anion gap: 6 (ref 5–15)
Anion gap: 11 (ref 5–15)
BUN: 25 mg/dL — ABNORMAL HIGH (ref 8–23)
BUN: 27 mg/dL — ABNORMAL HIGH (ref 8–23)
CO2: 28 mmol/L (ref 22–32)
CO2: 27 mmol/L (ref 22–32)
Calcium: 9.4 mg/dL (ref 8.9–10.3)
Calcium: 9.5 mg/dL (ref 8.9–10.3)
Chloride: 98 mmol/L (ref 98–111)
Chloride: 95 mmol/L — ABNORMAL LOW (ref 98–111)
Creatinine, Ser: 1.81 mg/dL — ABNORMAL HIGH (ref 0.44–1.00)
Creatinine, Ser: 1.57 mg/dL — ABNORMAL HIGH (ref 0.44–1.00)
GFR, Estimated: 30 mL/min — ABNORMAL LOW (ref >60)
GFR, Estimated: 35 mL/min — ABNORMAL LOW (ref >60)
Glucose, Bld: 119 mg/dL — ABNORMAL HIGH (ref 70–99)
Glucose, Bld: 116 mg/dL — ABNORMAL HIGH (ref 70–99)
Potassium: 4.1 mmol/L (ref 3.5–5.1)
Potassium: 3.9 mmol/L (ref 3.5–5.1)
Sodium: 132 mmol/L — ABNORMAL LOW (ref 135–145)
Sodium: 133 mmol/L — ABNORMAL LOW (ref 135–145)

## 2021-02-07 LAB — CBC
HCT: 24.6 % — ABNORMAL LOW (ref 36.0–46.0)
Hemoglobin: 7.5 g/dL — ABNORMAL LOW (ref 12.0–15.0)
MCH: 27 pg (ref 26.0–34.0)
MCHC: 30.5 g/dL (ref 30.0–36.0)
MCV: 88.5 fL (ref 80.0–100.0)
Platelets: 257 10*3/uL (ref 150–400)
RBC: 2.78 MIL/uL — ABNORMAL LOW (ref 3.87–5.11)
RDW: 18.1 % — ABNORMAL HIGH (ref 11.5–15.5)
WBC: 5.2 10*3/uL (ref 4.0–10.5)
nRBC: 0 % (ref 0.0–0.2)

## 2021-02-07 LAB — FOLATE: Folate: 9.7 ng/mL (ref >5.9)

## 2021-02-07 LAB — RETICULOCYTES
Immature Retic Fract: 36.7 % — ABNORMAL HIGH (ref 2.3–15.9)
RBC.: 2.99 MIL/uL — ABNORMAL LOW (ref 3.87–5.11)
Retic Count, Absolute: 68.8 10*3/uL (ref 19.0–186.0)
Retic Ct Pct: 2.3 % (ref 0.4–3.1)

## 2021-02-07 LAB — IRON AND TIBC
Iron: 13 ug/dL — ABNORMAL LOW (ref 28–170)
Saturation Ratios: 2 % — ABNORMAL LOW (ref 10.4–31.8)
TIBC: 573 ug/dL — ABNORMAL HIGH (ref 250–450)
UIBC: 560 ug/dL

## 2021-02-07 LAB — FERRITIN: Ferritin: 3 ng/mL — ABNORMAL LOW (ref 11–307)

## 2021-02-07 LAB — RESP PANEL BY RT-PCR (FLU A&B, COVID) ARPGX2
Influenza A by PCR: NEGATIVE
Influenza B by PCR: NEGATIVE
SARS Coronavirus 2 by RT PCR: NEGATIVE

## 2021-02-07 LAB — OCCULT BLOOD X 1 CARD TO LAB, STOOL: Fecal Occult Bld: NEGATIVE

## 2021-02-07 LAB — VITAMIN B12: Vitamin B-12: 1054 pg/mL — ABNORMAL HIGH (ref 180–914)

## 2021-02-07 LAB — MAGNESIUM: Magnesium: 2.2 mg/dL (ref 1.7–2.4)

## 2021-02-07 LAB — TROPONIN I (HIGH SENSITIVITY): Troponin I (High Sensitivity): 7 ng/L (ref <18)

## 2021-02-07 MED ORDER — FERROUS SULFATE 325 (65 FE) MG PO TABS: 325.0000 mg | ORAL_TABLET | Freq: Every day | ORAL | 0 refills | Status: DC

## 2021-02-07 NOTE — Progress Notes (Signed)
Primary Care Physician: Forrest Moron, MD Primary Cardiologist: Dr Dulce Sellar Primary Electrophysiologist: Dr Elberta Fortis Referring Physician: Dr Lenice Pressman is a 70 y.o. female with a history of SSS s/p PPM, CAD s/p CABG, HTN, renal artery stenosis, HLD, atrial flutter and atrial fibrillation who presents for follow up in the [facility]. Patient has a a previous cryoablation in 2019 and has been maintained on sotalol. She had more episodes of afib with symptoms of weakness, fatigue, and SOB and underwent repeat ablation with Dr Elberta Fortis on 03/22/19. She is on Eliquis for a CHADS2VASC score of 4. Patient is s/p repeat ablation with Dr Elberta Fortis on 04/04/20.   On follow up today, the device clinic received an alert for an ongoing episode of afib starting 01/29/21. Patient reports she had been started on prednisone for her orthopedic issues 2-3 days prior. She does have symptoms of palpitations and fatigue. She denies any bleeding issues on anticoagulation.   Today, she denies symptoms of chest pain, shortness of breath, orthopnea, PND, lower extremity edema, dizziness, presyncope, syncope, snoring, daytime somnolence, bleeding, or neurologic sequela. The patient is tolerating medications without difficulties and is otherwise without complaint today.    Atrial Fibrillation Risk Factors:  she does not have symptoms or diagnosis of sleep apnea. she does not have a history of rheumatic fever.  she has a BMI of Body mass index is 30.75 kg/m.Marland Kitchen Filed Weights   02/07/21 1319  Weight: 83.8 kg     Family History  Problem Relation Age of Onset   Hypertension Mother    Heart attack Father 66   Hypertension Sister    Hypertension Sister    Hypertension Daughter    Hypertension Son      Atrial Fibrillation Management history:  Previous antiarrhythmic drugs: sotalol  Previous cardioversions: remotely Previous ablations: cryoablation 2019, 03/22/19 (fib and  flutter), 04/04/20 CHADS2VASC score: 4 Anticoagulation history: Eliquis   Past Medical History:  Diagnosis Date   CAD in native artery 01/02/2019   Carotid stenosis, bilateral 01/02/2019   Essential hypertension 01/02/2019   History of amiodarone therapy 01/02/2019   Hx of CABG 01/02/2019   Hyperlipidemia 01/02/2019   PAF (paroxysmal atrial fibrillation) (HCC) 01/02/2019   Renal artery stenosis (HCC) 01/02/2019   Sick sinus syndrome (HCC) 01/02/2019   Past Surgical History:  Procedure Laterality Date   ATRIAL FIBRILLATION ABLATION N/A 03/22/2019   Procedure: ATRIAL FIBRILLATION ABLATION;  Surgeon: Regan Lemming, MD;  Location: [facility];  Service: Cardiovascular;  Laterality: N/A;   ATRIAL FIBRILLATION ABLATION N/A 04/04/2020   Procedure: ATRIAL FIBRILLATION ABLATION;  Surgeon: Regan Lemming, MD;  Location: [facility];  Service: Cardiovascular;  Laterality: N/A;   BOWEL RESECTION     CARDIOVERSION     CORONARY ANGIOPLASTY WITH STENT PLACEMENT     CORONARY ARTERY BYPASS GRAFT  2016   x 3 vessels   CRYOABLATION     INSERT / REPLACE / REMOVE PACEMAKER      Current Outpatient Medications  Medication Sig Dispense Refill   acetaminophen (TYLENOL) 500 MG tablet Take 1,000 mg by mouth every 6 (six) hours as needed for moderate pain or headache.     albuterol (VENTOLIN HFA) 108 (90 Base) MCG/ACT inhaler Inhale 2 puffs into the lungs every 6 (six) hours as needed for wheezing or shortness of breath.      allopurinol (ZYLOPRIM) 100 MG tablet Take 100 mg by mouth daily.  ALPRAZolam (XANAX) 0.25 MG tablet Take 0.125-0.25 mg by mouth at bedtime.     aspirin EC 81 MG tablet Take 81 mg by mouth daily.      atorvastatin (LIPITOR) 40 MG tablet Take 40 mg by mouth daily.      Biotin 5000 MCG CAPS Take 5,000 mcg by mouth daily.     carvedilol (COREG) 12.5 MG tablet Take 1 tablet (12.5 mg total) by mouth 2 (two) times daily. 180 tablet 3   cloNIDine (CATAPRES) 0.1 MG  tablet Take 0.1 mg by mouth 2 (two) times daily.     ELIQUIS 5 MG TABS tablet TAKE 1 TABLET BY MOUTH TWICE A DAY 180 tablet 1   furosemide (LASIX) 40 MG tablet Take 20-40 mg by mouth See admin instructions. Take 40 mg in the morning and 20 mg in the evening     levothyroxine (SYNTHROID) 75 MCG tablet Take 75 mcg by mouth daily before breakfast.      losartan (COZAAR) 100 MG tablet Take 100 mg by mouth daily.     nitroGLYCERIN (NITROSTAT) 0.4 MG SL tablet Place 0.4 mg under the tongue every 5 (five) minutes x 3 doses as needed for chest pain.      potassium chloride (KLOR-CON) 10 MEQ tablet Take 10 mEq by mouth every other day.     pramipexole (MIRAPEX) 0.5 MG tablet Take by mouth.     saline (AYR) GEL Place 1 application into the nose daily as needed (dryness).     SOTALOL AF 80 MG TABS TAKE 1 TABLET (80 MG TOTAL) BY MOUTH 2 (TWO) TIMES DAILY. 180 tablet 3   vitamin B-12 (CYANOCOBALAMIN) 1000 MCG tablet Take 1,000 mcg by mouth daily.     No current facility-administered medications for this encounter.    Allergies  Allergen Reactions   Tizanidine Itching and Other (See Comments)    "didnt like the way it made her feel" -- weakness    Ace Inhibitors Other (See Comments)    Cannot recall    Hydrocodone Bit-Homatrop Mbr Nausea And Vomiting   Rofecoxib Nausea Only    MADE ME FEEL WEIRD     Social History   Socioeconomic History   Marital status: Married    Spouse name: Not on file   Number of children: Not on file   Years of education: Not on file   Highest education level: Not on file  Occupational History   Not on file  Tobacco Use   Smoking status: Former    Packs/day: 0.75    Years: 45.00    Pack years: 33.75    Types: Cigarettes    Quit date: 2009    Years since quitting: 13.6   Smokeless tobacco: Never   Tobacco comments:    Former smoker 02/07/21  Vaping Use   Vaping Use: Never used  Substance and Sexual Activity   Alcohol use: Yes    Alcohol/week: 2.0  standard drinks    Types: 2 Cans of beer per week    Comment: 2 beers daily   Drug use: Never   Sexual activity: Not on file  Other Topics Concern   Not on file  Social History Narrative   Not on file   Social Determinants of Health   Financial Resource Strain: Not on file  Food Insecurity: Not on file  Transportation Needs: Not on file  Physical Activity: Not on file  Stress: Not on file  Social Connections: Not on file  Intimate Partner Violence: Not  on file     ROS- All systems are reviewed and negative except as per the HPI above.  Physical Exam: Vitals:   02/07/21 1319  BP: 94/70  Pulse: 74  Weight: 83.8 kg  Height: 5\' 5"  (1.651 m)    GEN- The patient is a well appearing obese female, alert and oriented x 3 today.   HEENT-head normocephalic, atraumatic, sclera clear, conjunctiva pink, hearing intact, trachea midline. Lungs- Clear to ausculation bilaterally, normal work of breathing Heart- irregular rate and rhythm, no murmurs, rubs or gallops  GI- soft, NT, ND, + BS Extremities- no clubbing, cyanosis, or edema MS- no significant deformity or atrophy Skin- no rash or lesion Psych- euthymic mood, full affect Neuro- strength and sensation are intact   Wt Readings from Last 3 Encounters:  02/07/21 83.8 kg  11/20/20 83.9 kg  10/29/20 83.5 kg    EKG today demonstrates  Afib with occasional V pacing Vent. rate 74 BPM PR interval * ms QRS duration 108 ms QT/QTcB 412/457 ms  Echo 01/10/19 demonstrated   1. Mild hypokinesis of the left ventricular, basal-mid inferoseptal wall.  2. The left ventricle has low normal systolic function, with an ejection fraction of 50-55%. The cavity size was normal. Left ventricular diastolic Doppler parameters are consistent with pseudonormalization.  3. The right ventricle has normal systolic function. The cavity was normal. There is no increase in right ventricular wall thickness.  4. Left atrial size was mildly dilated.  5.  Mild thickening of the aortic valve. Mild calcification of the aortic valve. Aortic valve regurgitation was not assessed by color flow Doppler.  6. The aorta is normal in size and structure.    Epic records are reviewed at length today  Assessment and Plan:  1. Persistent atrial fibrillation/atrial flutter S/p cryoablation 2019. S/p RF ablation 03/22/19 and 04/04/20 with Dr 04/06/20. Patient in symptomatic rate controlled afib. Suspect 2/2 recent steroid use.  Will plan for DCCV. Check bmet/cbc/mag Continue Eliquis 5 mg BID, patient denies any missed doses of anticoagulation in the last 3 weeks.  Continue sotalol 80 mg BID. QT stable. Continue Coreg 12.5 mg BID  This patients CHA2DS2-VASc Score and unadjusted Ischemic Stroke Rate (% per year) is equal to 4.8 % stroke rate/year from a score of 4  Above score calculated as 1 point each if present [CHF, HTN, DM, Vascular=MI/PAD/Aortic Plaque, Age if 65-74, or Female] Above score calculated as 2 points each if present [Age > 75, or Stroke/TIA/TE]  2. Obesity Body mass index is 30.75 kg/m. Lifestyle modification was discussed and encouraged including regular physical activity and weight reduction.  3. CAD S/p CABG. No anginal symptoms.  4. HTN Stable, no changes today.  5. Sick sinus syndrome S/p PPM, followed by Dr 08-27-2000 and the device clinic.   Follow up with the AF clinic for a virtual appointment post DCCV. Patient to send a manual transmission the day before visit.     Elberta Fortis PA-C Afib Clinic [facility] [address] [place], [place] [phone] 02/07/2021 1:26 PM

## 2021-02-07 NOTE — ED Notes (Signed)
Additional lab tubes provided to lab for additional lab test

## 2021-02-07 NOTE — ED Notes (Signed)
AVS provided to client, ED PA spoke with pt re: care today and recommended follow up care. Opportunity for questions provided prior to DC to home.

## 2021-02-07 NOTE — H&P (View-Only) (Signed)
Primary Care Physician: Forrest Moron, MD Primary Cardiologist: Dr Dulce Sellar Primary Electrophysiologist: Dr Elberta Fortis Referring Physician: Dr Lenice Pressman is a 70 y.o. female with a history of SSS s/p PPM, CAD s/p CABG, HTN, renal artery stenosis, HLD, atrial flutter and atrial fibrillation who presents for follow up in the [facility]. Patient has a a previous cryoablation in 2019 and has been maintained on sotalol. She had more episodes of afib with symptoms of weakness, fatigue, and SOB and underwent repeat ablation with Dr Elberta Fortis on 03/22/19. She is on Eliquis for a CHADS2VASC score of 4. Patient is s/p repeat ablation with Dr Elberta Fortis on 04/04/20.   On follow up today, the device clinic received an alert for an ongoing episode of afib starting 01/29/21. Patient reports she had been started on prednisone for her orthopedic issues 2-3 days prior. She does have symptoms of palpitations and fatigue. She denies any bleeding issues on anticoagulation.   Today, she denies symptoms of chest pain, shortness of breath, orthopnea, PND, lower extremity edema, dizziness, presyncope, syncope, snoring, daytime somnolence, bleeding, or neurologic sequela. The patient is tolerating medications without difficulties and is otherwise without complaint today.    Atrial Fibrillation Risk Factors:  she does not have symptoms or diagnosis of sleep apnea. she does not have a history of rheumatic fever.  she has a BMI of Body mass index is 30.75 kg/m.Marland Kitchen Filed Weights   02/07/21 1319  Weight: 83.8 kg     Family History  Problem Relation Age of Onset   Hypertension Mother    Heart attack Father 66   Hypertension Sister    Hypertension Sister    Hypertension Daughter    Hypertension Son      Atrial Fibrillation Management history:  Previous antiarrhythmic drugs: sotalol  Previous cardioversions: remotely Previous ablations: cryoablation 2019, 03/22/19 (fib and  flutter), 04/04/20 CHADS2VASC score: 4 Anticoagulation history: Eliquis   Past Medical History:  Diagnosis Date   CAD in native artery 01/02/2019   Carotid stenosis, bilateral 01/02/2019   Essential hypertension 01/02/2019   History of amiodarone therapy 01/02/2019   Hx of CABG 01/02/2019   Hyperlipidemia 01/02/2019   PAF (paroxysmal atrial fibrillation) (HCC) 01/02/2019   Renal artery stenosis (HCC) 01/02/2019   Sick sinus syndrome (HCC) 01/02/2019   Past Surgical History:  Procedure Laterality Date   ATRIAL FIBRILLATION ABLATION N/A 03/22/2019   Procedure: ATRIAL FIBRILLATION ABLATION;  Surgeon: Regan Lemming, MD;  Location: [facility];  Service: Cardiovascular;  Laterality: N/A;   ATRIAL FIBRILLATION ABLATION N/A 04/04/2020   Procedure: ATRIAL FIBRILLATION ABLATION;  Surgeon: Regan Lemming, MD;  Location: [facility];  Service: Cardiovascular;  Laterality: N/A;   BOWEL RESECTION     CARDIOVERSION     CORONARY ANGIOPLASTY WITH STENT PLACEMENT     CORONARY ARTERY BYPASS GRAFT  2016   x 3 vessels   CRYOABLATION     INSERT / REPLACE / REMOVE PACEMAKER      Current Outpatient Medications  Medication Sig Dispense Refill   acetaminophen (TYLENOL) 500 MG tablet Take 1,000 mg by mouth every 6 (six) hours as needed for moderate pain or headache.     albuterol (VENTOLIN HFA) 108 (90 Base) MCG/ACT inhaler Inhale 2 puffs into the lungs every 6 (six) hours as needed for wheezing or shortness of breath.      allopurinol (ZYLOPRIM) 100 MG tablet Take 100 mg by mouth daily.  ALPRAZolam (XANAX) 0.25 MG tablet Take 0.125-0.25 mg by mouth at bedtime.     aspirin EC 81 MG tablet Take 81 mg by mouth daily.      atorvastatin (LIPITOR) 40 MG tablet Take 40 mg by mouth daily.      Biotin 5000 MCG CAPS Take 5,000 mcg by mouth daily.     carvedilol (COREG) 12.5 MG tablet Take 1 tablet (12.5 mg total) by mouth 2 (two) times daily. 180 tablet 3   cloNIDine (CATAPRES) 0.1 MG  tablet Take 0.1 mg by mouth 2 (two) times daily.     ELIQUIS 5 MG TABS tablet TAKE 1 TABLET BY MOUTH TWICE A DAY 180 tablet 1   furosemide (LASIX) 40 MG tablet Take 20-40 mg by mouth See admin instructions. Take 40 mg in the morning and 20 mg in the evening     levothyroxine (SYNTHROID) 75 MCG tablet Take 75 mcg by mouth daily before breakfast.      losartan (COZAAR) 100 MG tablet Take 100 mg by mouth daily.     nitroGLYCERIN (NITROSTAT) 0.4 MG SL tablet Place 0.4 mg under the tongue every 5 (five) minutes x 3 doses as needed for chest pain.      potassium chloride (KLOR-CON) 10 MEQ tablet Take 10 mEq by mouth every other day.     pramipexole (MIRAPEX) 0.5 MG tablet Take by mouth.     saline (AYR) GEL Place 1 application into the nose daily as needed (dryness).     SOTALOL AF 80 MG TABS TAKE 1 TABLET (80 MG TOTAL) BY MOUTH 2 (TWO) TIMES DAILY. 180 tablet 3   vitamin B-12 (CYANOCOBALAMIN) 1000 MCG tablet Take 1,000 mcg by mouth daily.     No current facility-administered medications for this encounter.    Allergies  Allergen Reactions   Tizanidine Itching and Other (See Comments)    "didnt like the way it made her feel" -- weakness    Ace Inhibitors Other (See Comments)    Cannot recall    Hydrocodone Bit-Homatrop Mbr Nausea And Vomiting   Rofecoxib Nausea Only    MADE ME FEEL WEIRD     Social History   Socioeconomic History   Marital status: Married    Spouse name: Not on file   Number of children: Not on file   Years of education: Not on file   Highest education level: Not on file  Occupational History   Not on file  Tobacco Use   Smoking status: Former    Packs/day: 0.75    Years: 45.00    Pack years: 33.75    Types: Cigarettes    Quit date: 2009    Years since quitting: 13.6   Smokeless tobacco: Never   Tobacco comments:    Former smoker 02/07/21  Vaping Use   Vaping Use: Never used  Substance and Sexual Activity   Alcohol use: Yes    Alcohol/week: 2.0  standard drinks    Types: 2 Cans of beer per week    Comment: 2 beers daily   Drug use: Never   Sexual activity: Not on file  Other Topics Concern   Not on file  Social History Narrative   Not on file   Social Determinants of Health   Financial Resource Strain: Not on file  Food Insecurity: Not on file  Transportation Needs: Not on file  Physical Activity: Not on file  Stress: Not on file  Social Connections: Not on file  Intimate Partner Violence: Not  on file     ROS- All systems are reviewed and negative except as per the HPI above.  Physical Exam: Vitals:   02/07/21 1319  BP: 94/70  Pulse: 74  Weight: 83.8 kg  Height: 5\' 5"  (1.651 m)    GEN- The patient is a well appearing obese female, alert and oriented x 3 today.   HEENT-head normocephalic, atraumatic, sclera clear, conjunctiva pink, hearing intact, trachea midline. Lungs- Clear to ausculation bilaterally, normal work of breathing Heart- irregular rate and rhythm, no murmurs, rubs or gallops  GI- soft, NT, ND, + BS Extremities- no clubbing, cyanosis, or edema MS- no significant deformity or atrophy Skin- no rash or lesion Psych- euthymic mood, full affect Neuro- strength and sensation are intact   Wt Readings from Last 3 Encounters:  02/07/21 83.8 kg  11/20/20 83.9 kg  10/29/20 83.5 kg    EKG today demonstrates  Afib with occasional V pacing Vent. rate 74 BPM PR interval * ms QRS duration 108 ms QT/QTcB 412/457 ms  Echo 01/10/19 demonstrated   1. Mild hypokinesis of the left ventricular, basal-mid inferoseptal wall.  2. The left ventricle has low normal systolic function, with an ejection fraction of 50-55%. The cavity size was normal. Left ventricular diastolic Doppler parameters are consistent with pseudonormalization.  3. The right ventricle has normal systolic function. The cavity was normal. There is no increase in right ventricular wall thickness.  4. Left atrial size was mildly dilated.  5.  Mild thickening of the aortic valve. Mild calcification of the aortic valve. Aortic valve regurgitation was not assessed by color flow Doppler.  6. The aorta is normal in size and structure.    Epic records are reviewed at length today  Assessment and Plan:  1. Persistent atrial fibrillation/atrial flutter S/p cryoablation 2019. S/p RF ablation 03/22/19 and 04/04/20 with Dr 04/06/20. Patient in symptomatic rate controlled afib. Suspect 2/2 recent steroid use.  Will plan for DCCV. Check bmet/cbc/mag Continue Eliquis 5 mg BID, patient denies any missed doses of anticoagulation in the last 3 weeks.  Continue sotalol 80 mg BID. QT stable. Continue Coreg 12.5 mg BID  This patients CHA2DS2-VASc Score and unadjusted Ischemic Stroke Rate (% per year) is equal to 4.8 % stroke rate/year from a score of 4  Above score calculated as 1 point each if present [CHF, HTN, DM, Vascular=MI/PAD/Aortic Plaque, Age if 65-74, or Female] Above score calculated as 2 points each if present [Age > 75, or Stroke/TIA/TE]  2. Obesity Body mass index is 30.75 kg/m. Lifestyle modification was discussed and encouraged including regular physical activity and weight reduction.  3. CAD S/p CABG. No anginal symptoms.  4. HTN Stable, no changes today.  5. Sick sinus syndrome S/p PPM, followed by Dr 08-27-2000 and the device clinic.   Follow up with the AF clinic for a virtual appointment post DCCV. Patient to send a manual transmission the day before visit.     Elberta Fortis PA-C Afib Clinic [facility] [address] [place], [place] [phone] 02/07/2021 1:26 PM

## 2021-02-07 NOTE — Patient Instructions (Signed)
Cardioversion scheduled for Wednesday, August 31st  - Arrive at the [facility] and go to admitting at [facility]  - Do not eat or drink anything after midnight the night prior to your procedure.  - Take all your morning medication (except diabetic medications) with a sip of water prior to arrival.  - You will not be able to drive home after your procedure.  - Do NOT miss any doses of your blood thinner - if you should miss a dose please notify our office immediately.  - If you feel as if you go back into normal rhythm prior to scheduled cardioversion, please notify our office immediately. If your procedure is canceled in the cardioversion suite you will be charged a cancellation fee. Patients will be asked to: to mask in public and hand hygiene (no longer quarantine) in the 3 days prior to surgery, to report if any COVID-19-like illness or household contacts to COVID-19 to determine need for testing       Send transmission the day before your virtual appointment.

## 2021-02-07 NOTE — ED Provider Notes (Signed)
[facility] EMERGENCY DEPARTMENT Provider Note   CSN: [id number] Arrival date & time: 02/07/21  1520     History Chief Complaint  Patient presents with   abnormal labs    Diane Valentine is a 70 y.o. female.  70 year old female with history of paroxysmal A. fib, on Eliquis, hypertension, CAD, sent to the ED by her cardiologist office for anemia.  Patient states that she was contacted by her cardiologist office and told that she was in A. fib earlier this week.  Patient was seen in office today, had labs done for preop cardioversion scheduled for next Wednesday and was found to have a hemoglobin of 7.5.  Hemoglobin previously 14.110 months ago.  Patient denies dark or bloody stools, vaginal bleeding or any other sources of bleeding.  States that she has been feeling fatigued, mildly short of breath and cold for the past week and believes these are related to her anemia.  No history of anemia previously, unsure if she is ever had a blood transfusion but would except if needed.  No other complaints or concerns      Past Medical History:  Diagnosis Date   CAD in native artery 01/02/2019   Carotid stenosis, bilateral 01/02/2019   Essential hypertension 01/02/2019   History of amiodarone therapy 01/02/2019   Hx of CABG 01/02/2019   Hyperlipidemia 01/02/2019   PAF (paroxysmal atrial fibrillation) (HCC) 01/02/2019   Renal artery stenosis (HCC) 01/02/2019   Sick sinus syndrome (HCC) 01/02/2019    Patient Active Problem List   Diagnosis Date Noted   Persistent atrial fibrillation (HCC) 02/07/2021   Obesity (BMI 30-39.9) 11/20/2020   Secondary hypercoagulable state (HCC) 04/19/2019   Pseudophakia of both eyes 02/02/2019   CAD in native artery 01/02/2019   Essential hypertension 01/02/2019   Renal artery stenosis (HCC) 01/02/2019   Hyperlipidemia 01/02/2019   Carotid stenosis, bilateral 01/02/2019   Sick sinus syndrome (HCC) 01/02/2019   PAF (paroxysmal atrial fibrillation) (HCC)  01/02/2019   History of amiodarone therapy 01/02/2019   Hx of CABG 01/02/2019   Stenosis of carotid artery 08/05/2018   PAT (paroxysmal atrial tachycardia) (HCC) 07/14/2018   Pacemaker reprogramming/check 03/04/2018   Sinus bradycardia 09/22/2017   S/P ablation of atrial fibrillation 08/20/2017   AKI (acute kidney injury) (HCC) 11/22/2016   Acute deep vein thrombosis (DVT) of left lower extremity (HCC) 11/22/2016   Hypercalcemia 11/22/2016   Hyperglycemia 11/22/2016   Intestinal ischemia (HCC) 11/22/2016   Acute leg pain, left 09/23/2016   Acute on chronic renal insufficiency 09/23/2016   DVT, lower extremity, distal, acute, left (HCC) 09/23/2016   Posterior vitreous detachment of right eye 09/17/2016   Vitreous floaters of right eye 09/17/2016   History of colon polyps 08/17/2015   Esophageal reflux 08/16/2015   History of MI (myocardial infarction) 08/16/2015   Paroxysmal atrial fibrillation (HCC) 03/30/2015   S/P CABG (coronary artery bypass graft) 03/21/2015   Alcohol abuse 02/18/2015   Hypothyroid 02/18/2015   Thumb pain, left 04/20/2014    Past Surgical History:  Procedure Laterality Date   ATRIAL FIBRILLATION ABLATION N/A 03/22/2019   Procedure: ATRIAL FIBRILLATION ABLATION;  Surgeon: Regan Lemming, MD;  Location: [facility] INVASIVE CV LAB;  Service: Cardiovascular;  Laterality: N/A;   ATRIAL FIBRILLATION ABLATION N/A 04/04/2020   Procedure: ATRIAL FIBRILLATION ABLATION;  Surgeon: Regan Lemming, MD;  Location: [facility] INVASIVE CV LAB;  Service: Cardiovascular;  Laterality: N/A;   BOWEL RESECTION     CARDIOVERSION     CORONARY ANGIOPLASTY  WITH STENT PLACEMENT     CORONARY ARTERY BYPASS GRAFT  2016   x 3 vessels   CRYOABLATION     INSERT / REPLACE / REMOVE PACEMAKER       OB History   No obstetric history on file.     Family History  Problem Relation Age of Onset   Hypertension Mother    Heart attack Father 78   Hypertension Sister    Hypertension Sister     Hypertension Daughter    Hypertension Son     Social History   Tobacco Use   Smoking status: Former    Packs/day: 0.75    Years: 45.00    Pack years: 33.75    Types: Cigarettes    Quit date: 2009    Years since quitting: 13.6   Smokeless tobacco: Never   Tobacco comments:    Former smoker 02/07/21  Vaping Use   Vaping Use: Never used  Substance Use Topics   Alcohol use: Yes    Alcohol/week: 2.0 standard drinks    Types: 2 Cans of beer per week    Comment: 2 beers daily   Drug use: Never    Home Medications Prior to Admission medications   Medication Sig Start Date End Date Taking? Authorizing Provider  ferrous sulfate 325 (65 FE) MG tablet Take 1 tablet (325 mg total) by mouth daily. 02/07/21  Yes Jeannie Fend, PA-C  acetaminophen (TYLENOL) 500 MG tablet Take 1,000 mg by mouth every 6 (six) hours as needed for moderate pain or headache.    [provider]  albuterol (VENTOLIN HFA) 108 (90 Base) MCG/ACT inhaler Inhale 2 puffs into the lungs every 6 (six) hours as needed for wheezing or shortness of breath.  09/26/16   [provider]  allopurinol (ZYLOPRIM) 100 MG tablet Take 100 mg by mouth daily. 01/23/14   [provider]  ALPRAZolam Prudy Feeler) 0.25 MG tablet Take 0.125-0.25 mg by mouth at bedtime.    [provider]  aspirin EC 81 MG tablet Take 81 mg by mouth daily.     [provider]  atorvastatin (LIPITOR) 40 MG tablet Take 40 mg by mouth daily.  01/21/14   [provider]  Biotin 5000 MCG CAPS Take 5,000 mcg by mouth daily.    [provider]  carvedilol (COREG) 12.5 MG tablet Take 1 tablet (12.5 mg total) by mouth 2 (two) times daily. 09/03/20   Tobb, Kardie, DO  cloNIDine (CATAPRES) 0.1 MG tablet Take 0.1 mg by mouth 2 (two) times daily. 12/14/18   [provider]  ELIQUIS 5 MG TABS tablet TAKE 1 TABLET BY MOUTH TWICE A DAY 01/23/21   Tobb, Kardie, DO  furosemide (LASIX) 40 MG tablet Take 20-40 mg by  mouth See admin instructions. Take 40 mg in the morning and 20 mg in the evening 10/18/18   [provider]  levothyroxine (SYNTHROID) 75 MCG tablet Take 75 mcg by mouth daily before breakfast.  11/24/17   [provider]  losartan (COZAAR) 100 MG tablet Take 100 mg by mouth daily. 11/04/16   [provider]  nitroGLYCERIN (NITROSTAT) 0.4 MG SL tablet Place 0.4 mg under the tongue every 5 (five) minutes x 3 doses as needed for chest pain.     [provider]  potassium chloride (KLOR-CON) 10 MEQ tablet Take 10 mEq by mouth every other day.    [provider]  pramipexole (MIRAPEX) 0.5 MG tablet Take by mouth. 02/01/21  [provider]  saline (AYR) GEL Place 1 application into the nose daily as needed (dryness).    [provider]  SOTALOL AF 80 MG TABS TAKE 1 TABLET (80 MG TOTAL) BY MOUTH 2 (TWO) TIMES DAILY. 09/03/20   Tobb, Kardie, DO  vitamin B-12 (CYANOCOBALAMIN) 1000 MCG tablet Take 1,000 mcg by mouth daily.    [provider]    Allergies    Tizanidine, Ace inhibitors, Hydrocodone bit-homatrop mbr, and Rofecoxib  Review of Systems   Review of Systems  Constitutional:  Positive for fatigue. Negative for chills and fever.  Respiratory:  Positive for shortness of breath.   Cardiovascular:  Negative for chest pain.  Gastrointestinal:  Negative for nausea and vomiting.  Endocrine: Positive for cold intolerance.  Musculoskeletal:  Negative for arthralgias and myalgias.  Skin:  Negative for rash and wound.  Allergic/Immunologic: Negative for immunocompromised state.  Neurological:  Negative for weakness.  Hematological:  Bruises/bleeds easily.  Psychiatric/Behavioral:  Negative for confusion.   All other systems reviewed and are negative.  Physical Exam Updated Vital Signs BP (!) 168/101 (BP Location: Right Arm)   Pulse (!) 54   Temp 98.4 F (36.9 C) (Oral)   Resp 16   Ht 5\' 6"  (1.676 m)   Wt 83.5 kg   SpO2 97%    BMI 29.70 kg/m   Physical Exam Vitals and nursing note reviewed. Exam conducted with a chaperone present.  Constitutional:      General: She is not in acute distress.    Appearance: She is well-developed. She is not diaphoretic.  HENT:     Head: Normocephalic and atraumatic.  Cardiovascular:     Rate and Rhythm: Normal rate. Rhythm irregular.     Pulses: Normal pulses.     Heart sounds: Normal heart sounds.  Pulmonary:     Effort: Pulmonary effort is normal.     Breath sounds: Normal breath sounds.  Abdominal:     Palpations: Abdomen is soft.     Tenderness: There is no abdominal tenderness.  Genitourinary:    Rectum: Guaiac result negative.     Comments: Stool soft and brown Skin:    General: Skin is warm and dry.     Findings: No erythema or rash.  Neurological:     Mental Status: She is alert and oriented to person, place, and time.  Psychiatric:        Behavior: Behavior normal.    ED Results / Procedures / Treatments   Labs (all labs ordered are listed, but only abnormal results are displayed) Labs Reviewed  CBC WITH DIFFERENTIAL/PLATELET - Abnormal; Notable for the following components:      Result Value   RBC 2.96 (*)    Hemoglobin 7.9 (*)    HCT 26.0 (*)    RDW 18.2 (*)    nRBC 0.3 (*)    All other components within normal limits  BASIC METABOLIC PANEL - Abnormal; Notable for the following components:   Sodium 133 (*)    Chloride 95 (*)    Glucose, Bld 116 (*)    BUN 27 (*)    Creatinine, Ser 1.57 (*)    GFR, Estimated 35 (*)    All other components within normal limits  RESP PANEL BY RT-PCR (FLU A&B, COVID) ARPGX2  OCCULT BLOOD X 1 CARD TO LAB, STOOL  VITAMIN B12  FOLATE  IRON AND TIBC  FERRITIN  RETICULOCYTES  TROPONIN I (HIGH SENSITIVITY)    EKG EKG Interpretation  Date/Time:  Thursday February 07 2021 16:00:52 EDT Ventricular Rate:  78 PR Interval:    QRS Duration: 113 QT Interval:  393 QTC Calculation: 448 R Axis:   -15 Text  Interpretation: Atrial fibrillation Incomplete right bundle branch block Nonspecific T abnormalities, lateral leads TWI new since previous Confirmed by Richardean Canal [phone]) on 02/07/2021 4:27:33 PM  Radiology DG Chest Port 1 View  Result Date: 02/07/2021 CLINICAL DATA:  Shortness of breath EXAM: PORTABLE CHEST 1 VIEW COMPARISON:  Chest radiograph 08/16/2020 FINDINGS: A left chest wall cardiac device and associated leads, mediastinal surgical clips, and median sternotomy wires are stable. The heart is enlarged, unchanged. The mediastinal contours are stable. Reticular opacities in the left base likely reflect subsegmental atelectasis. There is no focal consolidation or pulmonary edema. There is no pleural effusion or pneumothorax There is no acute osseous abnormality. IMPRESSION: Unchanged cardiomegaly. No radiographic evidence of acute cardiopulmonary process. Electronically Signed   By: Lesia Hausen M.D.   On: 02/07/2021 17:02    Procedures Procedures   Medications Ordered in ED Medications - No data to display  ED Course  I have reviewed the triage vital signs and the nursing notes.  Pertinent labs & imaging results that were available during my care of the patient were reviewed by me and considered in my medical decision making (see chart for details).  Clinical Course as of 02/07/21 1737  Thu Feb 07, 2021  479 70 year old female sent by cardiology for a blood transfusion.  On exam, patient is well-appearing, blood pressure elevated, room air O2 sat 97%.  Found to be in A. fib but rate controlled.  On repeat, hemoglobin is 7.9.  Patient's Hemoccult is negative, her stool is soft and brown. [LM]  1737 Anemia panel sent out for outpatient follow-up with her cardiologist or primary care provider.  Started on iron supplement. Case discussed with Dr. Silverio Lay, ER attending, who has seen the patient.  Patient is pleased with plan for discharge home today and will follow up with her care team. [LM]     Clinical Course User Index [LM] Alden Hipp   MDM Rules/Calculators/A&P                           Final Clinical Impression(s) / ED Diagnoses Final diagnoses:  Anemia, unspecified type    Rx / DC Orders ED Discharge Orders          Ordered    ferrous sulfate 325 (65 FE) MG tablet  Daily        02/07/21 1727             Jeannie Fend, PA-C 02/07/21 1737    Charlynne Pander, MD 02/13/21 2124

## 2021-02-07 NOTE — ED Triage Notes (Signed)
Pt reports HGB 7.0, had blood taken today for pre op for cardioversion

## 2021-02-07 NOTE — Discharge Instructions (Addendum)
Follow up with your cardiologist and primary care regarding your send out anemia labs today. Return to the ER for worsening or concerning symptoms.

## 2021-02-13 ENCOUNTER — Encounter (HOSPITAL_COMMUNITY): Payer: Self-pay

## 2021-02-13 ENCOUNTER — Encounter (HOSPITAL_COMMUNITY)
Admission: RE | Disposition: A | Discharge status: Home / Self Care | Payer: Self-pay | Source: Home / Self Care | Attending: Cardiology

## 2021-02-13 ENCOUNTER — Other Ambulatory Visit: Payer: Self-pay

## 2021-02-13 ENCOUNTER — Encounter (HOSPITAL_COMMUNITY): Payer: Self-pay | Admitting: Cardiology

## 2021-02-13 ENCOUNTER — Ambulatory Visit (HOSPITAL_COMMUNITY)
Admission: RE | Admit: 2021-02-13 | Discharge: 2021-02-13 | Disposition: A | Discharge status: Home / Self Care | Payer: Medicare Other | Attending: Cardiology | Admitting: Cardiology

## 2021-02-13 ENCOUNTER — Encounter (HOSPITAL_COMMUNITY): Payer: Self-pay | Admitting: Anesthesiology

## 2021-02-13 DIAGNOSIS — Z79890 Hormone replacement therapy (postmenopausal): Secondary | ICD-10-CM | POA: Insufficient documentation

## 2021-02-13 DIAGNOSIS — Z885 Allergy status to narcotic agent status: Secondary | ICD-10-CM | POA: Insufficient documentation

## 2021-02-13 DIAGNOSIS — I4819 Other persistent atrial fibrillation: Secondary | ICD-10-CM | POA: Insufficient documentation

## 2021-02-13 DIAGNOSIS — E669 Obesity, unspecified: Secondary | ICD-10-CM | POA: Insufficient documentation

## 2021-02-13 DIAGNOSIS — Z5309 Procedure and treatment not carried out because of other contraindication: Secondary | ICD-10-CM | POA: Diagnosis not present

## 2021-02-13 DIAGNOSIS — Z79899 Other long term (current) drug therapy: Secondary | ICD-10-CM | POA: Diagnosis not present

## 2021-02-13 DIAGNOSIS — Z7982 Long term (current) use of aspirin: Secondary | ICD-10-CM | POA: Diagnosis not present

## 2021-02-13 DIAGNOSIS — I2510 Atherosclerotic heart disease of native coronary artery without angina pectoris: Secondary | ICD-10-CM | POA: Diagnosis not present

## 2021-02-13 DIAGNOSIS — I495 Sick sinus syndrome: Secondary | ICD-10-CM | POA: Insufficient documentation

## 2021-02-13 DIAGNOSIS — I701 Atherosclerosis of renal artery: Secondary | ICD-10-CM | POA: Diagnosis not present

## 2021-02-13 DIAGNOSIS — D649 Anemia, unspecified: Secondary | ICD-10-CM | POA: Insufficient documentation

## 2021-02-13 DIAGNOSIS — Z955 Presence of coronary angioplasty implant and graft: Secondary | ICD-10-CM | POA: Diagnosis not present

## 2021-02-13 DIAGNOSIS — Z951 Presence of aortocoronary bypass graft: Secondary | ICD-10-CM | POA: Insufficient documentation

## 2021-02-13 DIAGNOSIS — Z950 Presence of cardiac pacemaker: Secondary | ICD-10-CM | POA: Insufficient documentation

## 2021-02-13 DIAGNOSIS — Z87891 Personal history of nicotine dependence: Secondary | ICD-10-CM | POA: Insufficient documentation

## 2021-02-13 DIAGNOSIS — Z886 Allergy status to analgesic agent status: Secondary | ICD-10-CM | POA: Diagnosis not present

## 2021-02-13 DIAGNOSIS — I10 Essential (primary) hypertension: Secondary | ICD-10-CM | POA: Diagnosis not present

## 2021-02-13 DIAGNOSIS — Z888 Allergy status to other drugs, medicaments and biological substances status: Secondary | ICD-10-CM | POA: Diagnosis not present

## 2021-02-13 DIAGNOSIS — Z6830 Body mass index (BMI) 30.0-30.9, adult: Secondary | ICD-10-CM | POA: Insufficient documentation

## 2021-02-13 DIAGNOSIS — Z7901 Long term (current) use of anticoagulants: Secondary | ICD-10-CM | POA: Insufficient documentation

## 2021-02-13 DIAGNOSIS — I4892 Unspecified atrial flutter: Secondary | ICD-10-CM | POA: Diagnosis not present

## 2021-02-13 HISTORY — DX: Presence of cardiac pacemaker: Z95.0

## 2021-02-13 SURGERY — CANCELLED PROCEDURE

## 2021-02-13 NOTE — Anesthesia Preprocedure Evaluation (Deleted)
Anesthesia Evaluation  Patient identified by MRN, date of birth, ID band Patient awake    Reviewed: Allergy & Precautions, NPO status , Patient's Chart, lab work & pertinent test results  Airway        Dental   Pulmonary former smoker,           Cardiovascular hypertension, Pt. on medications and Pt. on home beta blockers + CAD, + CABG and + Peripheral Vascular Disease  + dysrhythmias Atrial Fibrillation      Neuro/Psych negative psych ROS   GI/Hepatic Neg liver ROS, GERD  Medicated,  Endo/Other  Hypothyroidism   Renal/GU      Musculoskeletal negative musculoskeletal ROS (+)   Abdominal   Peds  Hematology   Anesthesia Other Findings   Reproductive/Obstetrics                             Anesthesia Physical Anesthesia Plan Anesthesia Quick Evaluation

## 2021-02-13 NOTE — Progress Notes (Signed)
IN review of patient chart, she was found to have a Hbg of 7.5 on 8/25 and seen in ER with no source of blood loss so started on Iron.  I have discussed with Alphonzo Severance, PA.  I think we need to rule out occult GI pathology prior to DCCV and committing her to 4 weeks of continuous anticoagulation prior to being able to come off for any GI procedures.  Also with new anemia of unknown source she is at increased risk, if from a GI etiology that anemia or frank bleeding could occur.  Will cancel DCCV today as she is rate controlled and Afib clinic is referring to GI.

## 2021-02-13 NOTE — Interval H&P Note (Signed)
History and Physical Interval Note:  02/13/2021 10:46 AM  Diane Valentine  has presented today for surgery, with the diagnosis of AFIB.  The various methods of treatment have been discussed with the patient and family. After consideration of risks, benefits and other options for treatment, the patient has consented to  Procedure(s): CARDIOVERSION (N/A) as a surgical intervention.  The patient's history has been reviewed, patient examined, no change in status, stable for surgery.  I have reviewed the patient's chart and labs.  Questions were answered to the patient's satisfaction.     [name]

## 2021-02-19 ENCOUNTER — Telehealth (HOSPITAL_COMMUNITY): Payer: Self-pay | Admitting: *Deleted

## 2021-02-19 NOTE — Telephone Encounter (Signed)
Spoke to patients husband Greggory Stallion. [place] [employer] per DPR, that patient is back in normal rhythm and to have her call if she has any further questions or concerns. Verbalized understanding.

## 2021-02-19 NOTE — Telephone Encounter (Signed)
Patient called in stating she wonders if she is back in rhythm. She is going to send a transmission to confirm.

## 2021-02-19 NOTE — Telephone Encounter (Signed)
Appears last AF event occurred 02/16/21 @ 12:54 pm. Duration was 2 hours 1 minute.

## 2021-02-20 ENCOUNTER — Telehealth (HOSPITAL_COMMUNITY): Payer: Self-pay | Admitting: *Deleted

## 2021-02-20 ENCOUNTER — Telehealth (HOSPITAL_COMMUNITY): Payer: Medicare Other | Admitting: Physician Assistant

## 2021-02-20 ENCOUNTER — Telehealth: Payer: Self-pay | Admitting: Cardiology

## 2021-02-20 NOTE — Telephone Encounter (Signed)
   Pt requesting to speak with a nurse, she said it is very important it is regarding her upcoming procedure at [facility]

## 2021-02-20 NOTE — Telephone Encounter (Signed)
Attempted to return call to Pt.  No answer and call did not go to VM.  Sent mychart message to Pt.  Please see MyChart message.

## 2021-02-20 NOTE — Telephone Encounter (Signed)
Received request for clearance to hold Eliquis for colonoscopy/endoscopy from DR. Photon of [facility].  Per Jorja Loa PA pt will need to wait until 03/18/21 to hold Eliquis as pt is post spontaneous conversion to normal rhythm after several weeks of atrial fibrillation.    Pt very concerned regarding having to wait 30 days to have procedure to find source of bleed. Per Jorja Loa if GI feels pt cannot wait 30 days there is an increased risk of stroke. Pt verbalized understanding - she will discuss with GI regarding timing.

## 2021-03-06 ENCOUNTER — Ambulatory Visit (INDEPENDENT_AMBULATORY_CARE_PROVIDER_SITE_OTHER): Payer: Medicare Other

## 2021-03-06 DIAGNOSIS — I495 Sick sinus syndrome: Secondary | ICD-10-CM

## 2021-03-06 LAB — CUP PACEART REMOTE DEVICE CHECK
Battery Remaining Longevity: 87 mo
Battery Remaining Percentage: 69 %
Battery Voltage: 3.01 V
Brady Statistic AP VP Percent: 29 %
Brady Statistic AP VS Percent: 12 %
Brady Statistic AS VP Percent: 11 %
Brady Statistic AS VS Percent: 47 %
Brady Statistic RA Percent Paced: 23 %
Brady Statistic RV Percent Paced: 35 %
Date Time Interrogation Session: 20220921020012
Implantable Lead Implant Date: 20190909
Implantable Lead Implant Date: 20190909
Implantable Lead Location: 753859
Implantable Lead Location: 753860
Implantable Pulse Generator Implant Date: 20190909
Lead Channel Impedance Value: 410 Ohm
Lead Channel Impedance Value: 510 Ohm
Lead Channel Pacing Threshold Amplitude: 1.125 V
Lead Channel Pacing Threshold Pulse Width: 0.4 ms
Lead Channel Sensing Intrinsic Amplitude: 0.9 mV
Lead Channel Sensing Intrinsic Amplitude: 12 mV
Lead Channel Setting Pacing Amplitude: 1.375
Lead Channel Setting Pacing Amplitude: 2 V
Lead Channel Setting Pacing Pulse Width: 0.4 ms
Lead Channel Setting Sensing Sensitivity: 2 mV
Pulse Gen Model: 2272
Pulse Gen Serial Number: 9053085

## 2021-03-13 NOTE — Progress Notes (Signed)
Remote pacemaker transmission.   

## 2021-04-24 DIAGNOSIS — M7591 Shoulder lesion, unspecified, right shoulder: Secondary | ICD-10-CM

## 2021-04-24 DIAGNOSIS — M778 Other enthesopathies, not elsewhere classified: Secondary | ICD-10-CM | POA: Insufficient documentation

## 2021-05-07 ENCOUNTER — Telehealth: Payer: Self-pay | Admitting: Cardiology

## 2021-05-07 NOTE — Telephone Encounter (Signed)
Called patient to advise her device is compatible. Patients device is compatible. Direct phone number and fax number provided to patient to have office doing MRI send over clearance forms.

## 2021-05-07 NOTE — Telephone Encounter (Signed)
Patient called stating that she is waiting to get a MRI done but she has been unable to get it done because they would need to turn off her pace maker. If you could give her a call back at [phone] Thank you

## 2021-05-13 ENCOUNTER — Other Ambulatory Visit: Payer: Self-pay

## 2021-05-13 ENCOUNTER — Encounter: Payer: Self-pay | Admitting: Cardiology

## 2021-05-13 ENCOUNTER — Ambulatory Visit (INDEPENDENT_AMBULATORY_CARE_PROVIDER_SITE_OTHER): Payer: Medicare Other | Admitting: Cardiology

## 2021-05-13 VITALS — BP 174/90 | HR 61 | Ht 66.0 in | Wt 183.1 lb

## 2021-05-13 DIAGNOSIS — I701 Atherosclerosis of renal artery: Secondary | ICD-10-CM | POA: Diagnosis not present

## 2021-05-13 DIAGNOSIS — Z79899 Other long term (current) drug therapy: Secondary | ICD-10-CM

## 2021-05-13 DIAGNOSIS — I480 Paroxysmal atrial fibrillation: Secondary | ICD-10-CM

## 2021-05-13 MED ORDER — SOTALOL HCL 120 MG PO TABS: 120.0000 mg | ORAL_TABLET | Freq: Two times a day (BID) | ORAL | 6 refills | Status: DC

## 2021-05-13 NOTE — Patient Instructions (Addendum)
Medication Instructions:  Your physician has recommended you make the following change in your medication:  INCREASE Sotalol to 120 mg twice daily  *If you need a refill on your cardiac medications before your next appointment, please call your pharmacy*   Lab Work: Today: BMET & Magnesium  If you have labs (blood work) drawn today and your tests are completely normal, you will receive your results only by: MyChart Message (if you have MyChart) OR A paper copy in the mail If you have any lab test that is abnormal or we need to change your treatment, we will call you to review the results.   Testing/Procedures: None ordered   Follow-Up: At [facility], you and your health needs are our priority.  As part of our continuing mission to provide you with exceptional heart care, we have created designated Provider Care Teams.  These Care Teams include your primary Cardiologist (physician) and Advanced Practice Providers (APPs -  Physician Assistants and Nurse Practitioners) who all work together to provide you with the care you need, when you need it.  Remote monitoring is used to monitor your Pacemaker or ICD from home. This monitoring reduces the number of office visits required to check your device to one time per year. It allows [place] to keep an eye on the functioning of your device to ensure it is working properly. You are scheduled for a device check from home on 06/05/2021. You may send your transmission at any time that day. If you have a wireless device, the transmission will be sent automatically. After your physician reviews your transmission, you will receive a postcard with your next transmission date.  Your next appointment:   6 month(s)  The format for your next appointment:   In Person  Provider:   Loman Brooklyn, MD   Thank you for choosing [facility]!!   Dory Horn, RN [phone]   Let us know when you start the increased Sotalol dose so that we may  scheduled a follow up EKG about a week later.

## 2021-05-13 NOTE — Progress Notes (Signed)
Electrophysiology Office Note   Date:  05/13/2021   ID:  Diane Valentine, DOB [birth year], MRN [id number]  PCP:  Forrest Moron, MD  Cardiologist:  Dulce Sellar Primary Electrophysiologist:  Airyn Ellzey Jorja Loa, MD    No chief complaint on file.    History of Present Illness: Diane Valentine is a 70 y.o. female who is being seen today for the evaluation of pacemaker at the request of Forrest Moron, MD. Presenting today for electrophysiology evaluation.  She has a history significant for paroxysmal atrial fibrillation, sick sinus syndrome, status post Saint Jude dual-chamber pacemaker, coronary artery disease status post CABG, hypertension, renal artery stenosis, hyperlipidemia.  She had cryoablation 2019.  She had a repeat RF ablation 03/22/2019.  She had more frequent episodes of atrial fibrillation and had a third ablation 04/05/2020.  Today, denies symptoms of palpitations, chest pain, shortness of breath, orthopnea, PND, lower extremity edema, claudication, dizziness, presyncope, syncope, bleeding, or neurologic sequela. The patient is tolerating medications without difficulties.  Since being seen, she has done well.  She has had more frequent episodes of atrial fibrillation, though she is minimally aware.  She has had a lot of stress over this past year, and has a rotator cuff injury that may need surgery.  She has a 23% atrial fibrillation burden on device interrogation.  Past Medical History:  Diagnosis Date   CAD in native artery 01/02/2019   Carotid stenosis, bilateral 01/02/2019   Essential hypertension 01/02/2019   History of amiodarone therapy 01/02/2019   Hx of CABG 01/02/2019   Hyperlipidemia 01/02/2019   PAF (paroxysmal atrial fibrillation) (HCC) 01/02/2019   Presence of permanent cardiac pacemaker    Renal artery stenosis (HCC) 01/02/2019   Sick sinus syndrome (HCC) 01/02/2019   Past Surgical History:  Procedure Laterality Date   ATRIAL FIBRILLATION ABLATION N/A 03/22/2019    Procedure: ATRIAL FIBRILLATION ABLATION;  Surgeon: Regan Lemming, MD;  Location: [facility];  Service: Cardiovascular;  Laterality: N/A;   ATRIAL FIBRILLATION ABLATION N/A 04/04/2020   Procedure: ATRIAL FIBRILLATION ABLATION;  Surgeon: Regan Lemming, MD;  Location: [facility];  Service: Cardiovascular;  Laterality: N/A;   BOWEL RESECTION     CARDIOVERSION     CORONARY ANGIOPLASTY WITH STENT PLACEMENT     CORONARY ARTERY BYPASS GRAFT  2016   x 3 vessels   CRYOABLATION     INSERT / REPLACE / REMOVE PACEMAKER       Current Outpatient Medications  Medication Sig Dispense Refill   acetaminophen (TYLENOL) 500 MG tablet Take 1,000 mg by mouth every 6 (six) hours as needed for moderate pain or headache.     albuterol (VENTOLIN HFA) 108 (90 Base) MCG/ACT inhaler Inhale 2 puffs into the lungs every 6 (six) hours as needed for wheezing or shortness of breath.      allopurinol (ZYLOPRIM) 100 MG tablet Take 100 mg by mouth daily.     ALPRAZolam (XANAX) 0.25 MG tablet Take 0.25 mg by mouth at bedtime as needed for sleep.     aspirin EC 81 MG tablet Take 81 mg by mouth daily.      atorvastatin (LIPITOR) 40 MG tablet Take 40 mg by mouth every evening.     Biotin 19379 MCG TBDP Take 10,000 mcg by mouth at bedtime.     carvedilol (COREG) 12.5 MG tablet Take 1 tablet (12.5 mg total) by mouth 2 (two) times daily. 180 tablet 3   cloNIDine (CATAPRES) 0.1 MG tablet Take 0.1 mg by  mouth 2 (two) times daily.     ELIQUIS 5 MG TABS tablet TAKE 1 TABLET BY MOUTH TWICE A DAY 180 tablet 1   ferrous sulfate 325 (65 FE) MG tablet Take 1 tablet (325 mg total) by mouth daily. 30 tablet 0   furosemide (LASIX) 40 MG tablet Take 20-40 mg by mouth See admin instructions. Take 1 tablet (40 mg) by mouth in the morning and 0.5 tablet (20 mg) by mouth in the evening     levothyroxine (SYNTHROID) 75 MCG tablet Take 75 mcg by mouth daily before breakfast.      losartan (COZAAR) 100 MG tablet Take  100 mg by mouth daily.     Misc Natural Products (OSTEO BI-FLEX ADV TRIPLE ST) TABS Take 1 tablet by mouth at bedtime.     nitroGLYCERIN (NITROSTAT) 0.4 MG SL tablet Place 0.4 mg under the tongue every 5 (five) minutes x 3 doses as needed for chest pain.      potassium chloride (MICRO-K) 10 MEQ CR capsule Take 10 mEq by mouth every other day. In the morning     pramipexole (MIRAPEX) 0.5 MG tablet Take 0.5 mg by mouth at bedtime.     saline (AYR) GEL Place 1 application into the nose daily as needed (dryness).     SOTALOL AF 80 MG TABS TAKE 1 TABLET (80 MG TOTAL) BY MOUTH 2 (TWO) TIMES DAILY. 180 tablet 3   vitamin B-12 (CYANOCOBALAMIN) 1000 MCG tablet Take 1,000 mcg by mouth daily.     omeprazole (PRILOSEC) 20 MG capsule Take 20 mg by mouth 2 (two) times daily.     No current facility-administered medications for this visit.    Allergies:   Tizanidine, Ace inhibitors, Hydrocodone bit-homatrop mbr, and Rofecoxib   Social History:  The patient  reports that she quit smoking about 13 years ago. Her smoking use included cigarettes. She has a 33.75 pack-year smoking history. She has never used smokeless tobacco. She reports current alcohol use of about 2.0 standard drinks per week. She reports that she does not use drugs.   Family History:  The patient's family history includes Heart attack (age of onset: 82) in her father; Hypertension in her daughter, mother, sister, sister, and son.   ROS:  Please see the history of present illness.   Otherwise, review of systems is positive for none.   All other systems are reviewed and negative.   PHYSICAL EXAM: VS:  BP (!) 174/90   Pulse 61   Ht 5\' 6"  (1.676 m)   Wt 183 lb 1.9 oz (83.1 kg)   SpO2 95%   BMI 29.56 kg/m  , BMI Body mass index is 29.56 kg/m. GEN: Well nourished, well developed, in no acute distress  HEENT: normal  Neck: no JVD, carotid bruits, or masses Cardiac: RRR; no murmurs, rubs, or gallops,no edema  Respiratory:  clear to  auscultation bilaterally, normal work of breathing GI: soft, nontender, nondistended, + BS MS: no deformity or atrophy  Skin: warm and dry, device site well healed Neuro:  Strength and sensation are intact Psych: euthymic mood, full affect  EKG:  EKG is ordered today. Personal review of the ekg ordered shows ectopic atrial rhythm, rate 61, inferior inferolateral T wave inversion  Personal review of the device interrogation today. Results in [place]: 02/07/2021: BUN 27; Creatinine, Ser 1.57; Hemoglobin 7.9; Magnesium 2.2; Platelets 290; Potassium 3.9; Sodium 133    Lipid Panel     Component Value Date/Time  CHOL 167 01/03/2019 1713   TRIG 133 01/03/2019 1713   HDL 61 01/03/2019 1713   CHOLHDL 2.7 01/03/2019 1713   LDLCALC 79 01/03/2019 1713     Wt Readings from Last 3 Encounters:  05/13/21 183 lb 1.9 oz (83.1 kg)  02/07/21 184 lb (83.5 kg)  02/07/21 184 lb 12.8 oz (83.8 kg)      Other studies Reviewed: Additional studies/ records that were reviewed today include: TTE 01/10/19  Review of the above records today demonstrates:   1. Mild hypokinesis of the left ventricular, basal-mid inferoseptal wall.  2. The left ventricle has low normal systolic function, with an ejection fraction of 50-55%. The cavity size was normal. Left ventricular diastolic Doppler parameters are consistent with pseudonormalization.  3. The right ventricle has normal systolic function. The cavity was normal. There is no increase in right ventricular wall thickness.  4. Left atrial size was mildly dilated.  5. Mild thickening of the aortic valve. Mild calcification of the aortic valve. Aortic valve regurgitation was not assessed by color flow Doppler.  6. The aorta is normal in size and structure.   ASSESSMENT AND PLAN:  1.  Sick sinus syndrome: Status post Saint Jude dual-chamber pacemaker.  Device functioning appropriately.  No changes at this time.  2.  Paroxysmal atrial  fibrillation: Status post cryoablation with 2 subsequent RF ablations, most recently 04/05/2020.  She has had more frequent episodes of atrial fibrillation and is on sotalol 80 mg twice daily, Eliquis 5 mg twice daily.  High risk medication monitoring for sotalol.  We Addam Goeller check magnesium today.  CHA2DS2-VASc of 4.  Her burden of atrial fibrillation is up to 23%.  Due to that, Cristino Degroff increase sotalol to 120 mg.  We Donnielle Addison have her come back for an ECG in 1 week.  3.  Hypertension: Elevated today.  Usually well controlled.  No changes.  4.  Hyperlipidemia: Continue statin per primary cardiology  Current medicines are reviewed at length with the patient today.   The patient does not have concerns regarding her medicines.  The following changes were made today: Increase sotalol  Labs/ tests ordered today include:  Orders Placed This Encounter  Procedures   Magnesium   Basic metabolic panel     Disposition:   FU with Neala Miggins 6 months  Signed, Bedford Winsor Meredith Leeds, MD  05/13/2021 3:24 PM     [address] [place] [address] [phone] (office) [phone] (fax)

## 2021-05-14 LAB — BASIC METABOLIC PANEL
BUN/Creatinine Ratio: 27 (ref 12–28)
BUN: 39 mg/dL — ABNORMAL HIGH (ref 8–27)
CO2: 23 mmol/L (ref 20–29)
Calcium: 10.3 mg/dL (ref 8.7–10.3)
Chloride: 97 mmol/L (ref 96–106)
Creatinine, Ser: 1.44 mg/dL — ABNORMAL HIGH (ref 0.57–1.00)
Glucose: 101 mg/dL — ABNORMAL HIGH (ref 70–99)
Potassium: 4 mmol/L (ref 3.5–5.2)
Sodium: 137 mmol/L (ref 134–144)
eGFR: 39 mL/min/{1.73_m2} — ABNORMAL LOW (ref >59)

## 2021-05-14 LAB — MAGNESIUM: Magnesium: 2.4 mg/dL — ABNORMAL HIGH (ref 1.6–2.3)

## 2021-05-20 NOTE — Addendum Note (Signed)
Addended by: Solon Augusta on: 05/20/2021 12:27 PM   Modules accepted: Orders

## 2021-06-05 ENCOUNTER — Ambulatory Visit (INDEPENDENT_AMBULATORY_CARE_PROVIDER_SITE_OTHER): Payer: Medicare Other

## 2021-06-05 DIAGNOSIS — I495 Sick sinus syndrome: Secondary | ICD-10-CM | POA: Diagnosis not present

## 2021-06-05 LAB — CUP PACEART REMOTE DEVICE CHECK
Battery Remaining Longevity: 84 mo
Battery Remaining Percentage: 67 %
Battery Voltage: 3.01 V
Brady Statistic AP VP Percent: 19 %
Brady Statistic AP VS Percent: 8.7 %
Brady Statistic AS VP Percent: 14 %
Brady Statistic AS VS Percent: 57 %
Brady Statistic RA Percent Paced: 27 %
Brady Statistic RV Percent Paced: 33 %
Date Time Interrogation Session: 20221221020014
Implantable Lead Implant Date: 20190909
Implantable Lead Implant Date: 20190909
Implantable Lead Location: 753859
Implantable Lead Location: 753860
Implantable Pulse Generator Implant Date: 20190909
Lead Channel Impedance Value: 410 Ohm
Lead Channel Impedance Value: 460 Ohm
Lead Channel Pacing Threshold Amplitude: 0.75 V
Lead Channel Pacing Threshold Amplitude: 1 V
Lead Channel Pacing Threshold Pulse Width: 0.4 ms
Lead Channel Pacing Threshold Pulse Width: 0.4 ms
Lead Channel Sensing Intrinsic Amplitude: 0.4 mV
Lead Channel Sensing Intrinsic Amplitude: 12 mV
Lead Channel Setting Pacing Amplitude: 2 V
Lead Channel Setting Pacing Amplitude: 2.5 V
Lead Channel Setting Pacing Pulse Width: 0.4 ms
Lead Channel Setting Sensing Sensitivity: 2 mV
Pulse Gen Model: 2272
Pulse Gen Serial Number: 9053085

## 2021-06-14 NOTE — Progress Notes (Signed)
Remote pacemaker transmission.   

## 2021-07-08 ENCOUNTER — Encounter: Payer: Self-pay | Admitting: Cardiology

## 2021-07-08 ENCOUNTER — Ambulatory Visit (INDEPENDENT_AMBULATORY_CARE_PROVIDER_SITE_OTHER): Payer: Medicare Other | Admitting: Cardiology

## 2021-07-08 ENCOUNTER — Other Ambulatory Visit: Payer: Self-pay

## 2021-07-08 VITALS — BP 110/78 | HR 8 | Ht 66.0 in | Wt 184.0 lb

## 2021-07-08 DIAGNOSIS — I484 Atypical atrial flutter: Secondary | ICD-10-CM | POA: Diagnosis not present

## 2021-07-08 DIAGNOSIS — I495 Sick sinus syndrome: Secondary | ICD-10-CM

## 2021-07-08 NOTE — Telephone Encounter (Signed)
Pt added to [facility] scheduled today in [facility] office for further evaluation of concerns.

## 2021-07-08 NOTE — Progress Notes (Signed)
Electrophysiology Office Note   Date:  07/08/2021   ID:  Diane Valentine, DOB 18-Mar-1951, MRN [id number]  PCP:  Charleston Poot, MD  Cardiologist:  Bettina Gavia Primary Electrophysiologist:  Efrain Clauson Meredith Leeds, MD    No chief complaint on file.     History of Present Illness: Diane Valentine is a 71 y.o. female who is being seen today for the evaluation of pacemaker at the request of Charleston Poot, MD. Presenting today for electrophysiology evaluation.  She has a history significant for paroxysmal atrial fibrillation, sick sinus syndrome, coronary artery disease status post CABG, hypertension, renal artery stenosis, hyperlipidemia.  She is status post Public house manager.  She had a cryoablation in 2019.  She had repeat RF ablation 03/22/2019 and 04/05/2020.  Today, denies symptoms of palpitations, chest pain, shortness of breath, orthopnea, PND, lower extremity edema, claudication, dizziness, presyncope, syncope, bleeding, or neurologic sequela. The patient is tolerating medications without difficulties.  This morning, she woke up with palpitations.  She states that her heart rate was fast.  She had some chest discomfort and arm discomfort.  She sent in a transmission which revealed likely atrial flutter.  She is currently undergoing quite a bit of stress at home.  Past Medical History:  Diagnosis Date   CAD in native artery 01/02/2019   Carotid stenosis, bilateral 01/02/2019   Essential hypertension 01/02/2019   History of amiodarone therapy 01/02/2019   Hx of CABG 01/02/2019   Hyperlipidemia 01/02/2019   PAF (paroxysmal atrial fibrillation) ([facility]) 01/02/2019   Presence of permanent cardiac pacemaker    Renal artery stenosis ([facility]) 01/02/2019   Sick sinus syndrome ([facility]) 01/02/2019   Past Surgical History:  Procedure Laterality Date   ATRIAL FIBRILLATION ABLATION N/A 03/22/2019   Procedure: ATRIAL FIBRILLATION ABLATION;  Surgeon: Constance Haw, MD;  Location: [facility];  Service: Cardiovascular;  Laterality: N/A;   ATRIAL FIBRILLATION ABLATION N/A 04/04/2020   Procedure: ATRIAL FIBRILLATION ABLATION;  Surgeon: Constance Haw, MD;  Location: [facility];  Service: Cardiovascular;  Laterality: N/A;   BOWEL RESECTION     CARDIOVERSION     CORONARY ANGIOPLASTY WITH STENT PLACEMENT     CORONARY ARTERY BYPASS GRAFT  2016   x 3 vessels   CRYOABLATION     INSERT / REPLACE / REMOVE PACEMAKER       Current Outpatient Medications  Medication Sig Dispense Refill   acetaminophen (TYLENOL) 500 MG tablet Take 1,000 mg by mouth every 6 (six) hours as needed for moderate pain or headache.     albuterol (VENTOLIN HFA) 108 (90 Base) MCG/ACT inhaler Inhale 2 puffs into the lungs every 6 (six) hours as needed for wheezing or shortness of breath.      allopurinol (ZYLOPRIM) 100 MG tablet Take 100 mg by mouth daily.     ALPRAZolam (XANAX) 0.25 MG tablet Take 0.25 mg by mouth at bedtime as needed for sleep.     aspirin EC 81 MG tablet Take 81 mg by mouth daily.      atorvastatin (LIPITOR) 40 MG tablet Take 40 mg by mouth every evening.     Biotin 10000 MCG TBDP Take 10,000 mcg by mouth at bedtime.     carvedilol (COREG) 12.5 MG tablet Take 1 tablet (12.5 mg total) by mouth 2 (two) times daily. 180 tablet 3   cloNIDine (CATAPRES) 0.1 MG tablet Take 0.1 mg by mouth 2 (two) times daily.     ELIQUIS 5 MG TABS tablet TAKE  1 TABLET BY MOUTH TWICE A DAY 180 tablet 1   ferrous sulfate 325 (65 FE) MG tablet Take 1 tablet (325 mg total) by mouth daily. 30 tablet 0   furosemide (LASIX) 40 MG tablet Take 20-40 mg by mouth See admin instructions. Take 1 tablet (40 mg) by mouth in the morning and 0.5 tablet (20 mg) by mouth in the evening     levothyroxine (SYNTHROID) 75 MCG tablet Take 75 mcg by mouth daily before breakfast.      losartan (COZAAR) 100 MG tablet Take 100 mg by mouth daily.     Misc Natural Products (OSTEO BI-FLEX ADV TRIPLE ST) TABS Take 1  tablet by mouth at bedtime.     nitroGLYCERIN (NITROSTAT) 0.4 MG SL tablet Place 0.4 mg under the tongue every 5 (five) minutes x 3 doses as needed for chest pain.      omeprazole (PRILOSEC) 20 MG capsule Take 20 mg by mouth 2 (two) times daily.     potassium chloride (MICRO-K) 10 MEQ CR capsule Take 10 mEq by mouth every other day. In the morning     pramipexole (MIRAPEX) 0.5 MG tablet Take 0.5 mg by mouth at bedtime.     saline (AYR) GEL Place 1 application into the nose daily as needed (dryness).     sotalol (BETAPACE) 120 MG tablet Take 1 tablet (120 mg total) by mouth 2 (two) times daily. 60 tablet 6   vitamin B-12 (CYANOCOBALAMIN) 1000 MCG tablet Take 1,000 mcg by mouth daily.     No current facility-administered medications for this visit.    Allergies:   Tizanidine, Ace inhibitors, Hydrocodone bit-homatrop mbr, and Rofecoxib   Social History:  The patient  reports that she quit smoking about 14 years ago. Her smoking use included cigarettes. She has a 33.75 pack-year smoking history. She has never used smokeless tobacco. She reports current alcohol use of about 2.0 standard drinks per week. She reports that she does not use drugs.   Family History:  The patient's family history includes Heart attack (age of onset: 72) in her father; Hypertension in her daughter, mother, sister, sister, and son.   ROS:  Please see the history of present illness.   Otherwise, review of systems is positive for none.   All other systems are reviewed and negative.   PHYSICAL EXAM: VS:  BP 110/78    Pulse (!) 8    Ht 5\' 6"  (1.676 m)    Wt 184 lb (83.5 kg)    SpO2 97%    BMI 29.70 kg/m  , BMI Body mass index is 29.7 kg/m. GEN: Well nourished, well developed, in no acute distress  HEENT: normal  Neck: no JVD, carotid bruits, or masses Cardiac: RRR; no murmurs, rubs, or gallops,no edema  Respiratory:  clear to auscultation bilaterally, normal work of breathing GI: soft, nontender, nondistended, + BS MS:  no deformity or atrophy  Skin: warm and dry, device site well healed Neuro:  Strength and sensation are intact Psych: euthymic mood, full affect  EKG:  EKG is ordered today. Personal review of the ekg ordered shows ectopic atrial rhythm, rate 61, inferior inferolateral T wave inversion  Personal review of the device interrogation today. Results in [facility]: 02/07/2021: Hemoglobin 7.9; Platelets 290 05/13/2021: BUN 39; Creatinine, Ser 1.44; Magnesium 2.4; Potassium 4.0; Sodium 137    Lipid Panel     Component Value Date/Time   CHOL 167 01/03/2019 1713   TRIG 133 01/03/2019 1713  HDL 61 01/03/2019 1713   CHOLHDL 2.7 01/03/2019 1713   LDLCALC 79 01/03/2019 1713     Wt Readings from Last 3 Encounters:  07/08/21 184 lb (83.5 kg)  05/13/21 183 lb 1.9 oz (83.1 kg)  02/07/21 184 lb (83.5 kg)      Other studies Reviewed: Additional studies/ records that were reviewed today include: TTE 01/10/19  Review of the above records today demonstrates:   1. Mild hypokinesis of the left ventricular, basal-mid inferoseptal wall.  2. The left ventricle has low normal systolic function, with an ejection fraction of 50-55%. The cavity size was normal. Left ventricular diastolic Doppler parameters are consistent with pseudonormalization.  3. The right ventricle has normal systolic function. The cavity was normal. There is no increase in right ventricular wall thickness.  4. Left atrial size was mildly dilated.  5. Mild thickening of the aortic valve. Mild calcification of the aortic valve. Aortic valve regurgitation was not assessed by color flow Doppler.  6. The aorta is normal in size and structure.   ASSESSMENT AND PLAN:  1.  Sick sinus syndrome: Status post Saint Jude dual-chamber pacemaker.  Device functioning properly.  No changes at this time.  2.  Paroxysmal atrial fibrillation: Status post cryoablation with 2 subsequent RF ablations, most recently 04/05/2020.  Currently on  sotalol 120 mg twice daily.  High risk medication monitoring via ECG.  CHA2DS2-VASc of 4.  Fortunately he is in atrial flutter today.  We Diane Valentine arrange for cardioversion.  3.  Hypertension: Currently well controlled  4.  Hyperlipidemia: Continue statin per primary cardiology   Current medicines are reviewed at length with the patient today.   The patient does not have concerns regarding her medicines.  The following changes were made today: None  Labs/ tests ordered today include:  Orders Placed This Encounter  Procedures   EKG 12-Lead     Disposition:   FU with Diane Valentine 3 months  Signed, Fabion Gatson Meredith Leeds, MD  07/08/2021 3:21 PM     [address] [place] [address] [phone] (office) [phone] (fax)

## 2021-07-08 NOTE — H&P (View-Only) (Signed)
Electrophysiology Office Note   Date:  07/08/2021   ID:  Amariona Tousley, DOB 04-26-1951, MRN [id number]  PCP:  Charleston Poot, MD  Cardiologist:  Bettina Gavia Primary Electrophysiologist:  Seairra Otani Meredith Leeds, MD    No chief complaint on file.     History of Present Illness: Diane Valentine is a 71 y.o. female who is being seen today for the evaluation of pacemaker at the request of Charleston Poot, MD. Presenting today for electrophysiology evaluation.  She has a history significant for paroxysmal atrial fibrillation, sick sinus syndrome, coronary artery disease status post CABG, hypertension, renal artery stenosis, hyperlipidemia.  She is status post Public house manager.  She had a cryoablation in 2019.  She had repeat RF ablation 03/22/2019 and 04/05/2020.  Today, denies symptoms of palpitations, chest pain, shortness of breath, orthopnea, PND, lower extremity edema, claudication, dizziness, presyncope, syncope, bleeding, or neurologic sequela. The patient is tolerating medications without difficulties.  This morning, she woke up with palpitations.  She states that her heart rate was fast.  She had some chest discomfort and arm discomfort.  She sent in a transmission which revealed likely atrial flutter.  She is currently undergoing quite a bit of stress at home.  Past Medical History:  Diagnosis Date   CAD in native artery 01/02/2019   Carotid stenosis, bilateral 01/02/2019   Essential hypertension 01/02/2019   History of amiodarone therapy 01/02/2019   Hx of CABG 01/02/2019   Hyperlipidemia 01/02/2019   PAF (paroxysmal atrial fibrillation) ([place]) 01/02/2019   Presence of permanent cardiac pacemaker    Renal artery stenosis ([place]) 01/02/2019   Sick sinus syndrome ([place]) 01/02/2019   Past Surgical History:  Procedure Laterality Date   ATRIAL FIBRILLATION ABLATION N/A 03/22/2019   Procedure: ATRIAL FIBRILLATION ABLATION;  Surgeon: Constance Haw, MD;  Location: [facility];  Service: Cardiovascular;  Laterality: N/A;   ATRIAL FIBRILLATION ABLATION N/A 04/04/2020   Procedure: ATRIAL FIBRILLATION ABLATION;  Surgeon: Constance Haw, MD;  Location: [facility];  Service: Cardiovascular;  Laterality: N/A;   BOWEL RESECTION     CARDIOVERSION     CORONARY ANGIOPLASTY WITH STENT PLACEMENT     CORONARY ARTERY BYPASS GRAFT  2016   x 3 vessels   CRYOABLATION     INSERT / REPLACE / REMOVE PACEMAKER       Current Outpatient Medications  Medication Sig Dispense Refill   acetaminophen (TYLENOL) 500 MG tablet Take 1,000 mg by mouth every 6 (six) hours as needed for moderate pain or headache.     albuterol (VENTOLIN HFA) 108 (90 Base) MCG/ACT inhaler Inhale 2 puffs into the lungs every 6 (six) hours as needed for wheezing or shortness of breath.      allopurinol (ZYLOPRIM) 100 MG tablet Take 100 mg by mouth daily.     ALPRAZolam (XANAX) 0.25 MG tablet Take 0.25 mg by mouth at bedtime as needed for sleep.     aspirin EC 81 MG tablet Take 81 mg by mouth daily.      atorvastatin (LIPITOR) 40 MG tablet Take 40 mg by mouth every evening.     Biotin 10000 MCG TBDP Take 10,000 mcg by mouth at bedtime.     carvedilol (COREG) 12.5 MG tablet Take 1 tablet (12.5 mg total) by mouth 2 (two) times daily. 180 tablet 3   cloNIDine (CATAPRES) 0.1 MG tablet Take 0.1 mg by mouth 2 (two) times daily.     ELIQUIS 5 MG TABS tablet TAKE  1 TABLET BY MOUTH TWICE A DAY 180 tablet 1   ferrous sulfate 325 (65 FE) MG tablet Take 1 tablet (325 mg total) by mouth daily. 30 tablet 0   furosemide (LASIX) 40 MG tablet Take 20-40 mg by mouth See admin instructions. Take 1 tablet (40 mg) by mouth in the morning and 0.5 tablet (20 mg) by mouth in the evening     levothyroxine (SYNTHROID) 75 MCG tablet Take 75 mcg by mouth daily before breakfast.      losartan (COZAAR) 100 MG tablet Take 100 mg by mouth daily.     Misc Natural Products (OSTEO BI-FLEX ADV TRIPLE ST) TABS Take 1  tablet by mouth at bedtime.     nitroGLYCERIN (NITROSTAT) 0.4 MG SL tablet Place 0.4 mg under the tongue every 5 (five) minutes x 3 doses as needed for chest pain.      omeprazole (PRILOSEC) 20 MG capsule Take 20 mg by mouth 2 (two) times daily.     potassium chloride (MICRO-K) 10 MEQ CR capsule Take 10 mEq by mouth every other day. In the morning     pramipexole (MIRAPEX) 0.5 MG tablet Take 0.5 mg by mouth at bedtime.     saline (AYR) GEL Place 1 application into the nose daily as needed (dryness).     sotalol (BETAPACE) 120 MG tablet Take 1 tablet (120 mg total) by mouth 2 (two) times daily. 60 tablet 6   vitamin B-12 (CYANOCOBALAMIN) 1000 MCG tablet Take 1,000 mcg by mouth daily.     No current facility-administered medications for this visit.    Allergies:   Tizanidine, Ace inhibitors, Hydrocodone bit-homatrop mbr, and Rofecoxib   Social History:  The patient  reports that she quit smoking about 14 years ago. Her smoking use included cigarettes. She has a 33.75 pack-year smoking history. She has never used smokeless tobacco. She reports current alcohol use of about 2.0 standard drinks per week. She reports that she does not use drugs.   Family History:  The patient's family history includes Heart attack (age of onset: 46) in her father; Hypertension in her daughter, mother, sister, sister, and son.   ROS:  Please see the history of present illness.   Otherwise, review of systems is positive for none.   All other systems are reviewed and negative.   PHYSICAL EXAM: VS:  BP 110/78    Pulse (!) 8    Ht 5\' 6"  (1.676 m)    Wt 184 lb (83.5 kg)    SpO2 97%    BMI 29.70 kg/m  , BMI Body mass index is 29.7 kg/m. GEN: Well nourished, well developed, in no acute distress  HEENT: normal  Neck: no JVD, carotid bruits, or masses Cardiac: RRR; no murmurs, rubs, or gallops,no edema  Respiratory:  clear to auscultation bilaterally, normal work of breathing GI: soft, nontender, nondistended, + BS MS:  no deformity or atrophy  Skin: warm and dry, device site well healed Neuro:  Strength and sensation are intact Psych: euthymic mood, full affect  EKG:  EKG is ordered today. Personal review of the ekg ordered shows ectopic atrial rhythm, rate 61, inferior inferolateral T wave inversion  Personal review of the device interrogation today. Results in [facility]: 02/07/2021: Hemoglobin 7.9; Platelets 290 05/13/2021: BUN 39; Creatinine, Ser 1.44; Magnesium 2.4; Potassium 4.0; Sodium 137    Lipid Panel     Component Value Date/Time   CHOL 167 01/03/2019 1713   TRIG 133 01/03/2019 1713  HDL 61 01/03/2019 1713   CHOLHDL 2.7 01/03/2019 1713   LDLCALC 79 01/03/2019 1713     Wt Readings from Last 3 Encounters:  07/08/21 184 lb (83.5 kg)  05/13/21 183 lb 1.9 oz (83.1 kg)  02/07/21 184 lb (83.5 kg)      Other studies Reviewed: Additional studies/ records that were reviewed today include: TTE 01/10/19  Review of the above records today demonstrates:   1. Mild hypokinesis of the left ventricular, basal-mid inferoseptal wall.  2. The left ventricle has low normal systolic function, with an ejection fraction of 50-55%. The cavity size was normal. Left ventricular diastolic Doppler parameters are consistent with pseudonormalization.  3. The right ventricle has normal systolic function. The cavity was normal. There is no increase in right ventricular wall thickness.  4. Left atrial size was mildly dilated.  5. Mild thickening of the aortic valve. Mild calcification of the aortic valve. Aortic valve regurgitation was not assessed by color flow Doppler.  6. The aorta is normal in size and structure.   ASSESSMENT AND PLAN:  1.  Sick sinus syndrome: Status post Saint Jude dual-chamber pacemaker.  Device functioning properly.  No changes at this time.  2.  Paroxysmal atrial fibrillation: Status post cryoablation with 2 subsequent RF ablations, most recently 04/05/2020.  Currently on  sotalol 120 mg twice daily.  High risk medication monitoring via ECG.  CHA2DS2-VASc of 4.  Fortunately he is in atrial flutter today.  We Jenia Klepper arrange for cardioversion.  3.  Hypertension: Currently well controlled  4.  Hyperlipidemia: Continue statin per primary cardiology   Current medicines are reviewed at length with the patient today.   The patient does not have concerns regarding her medicines.  The following changes were made today: None  Labs/ tests ordered today include:  Orders Placed This Encounter  Procedures   EKG 12-Lead     Disposition:   FU with Takila Kronberg 3 months  Signed, Lyndol Vanderheiden Meredith Leeds, MD  07/08/2021 3:21 PM     [place] [address] [phone] (office) [phone] (fax)

## 2021-07-08 NOTE — Patient Instructions (Addendum)
Medication Instructions:  Your physician recommends that you continue on your current medications as directed. Please refer to the Current Medication list given to you today.  *If you need a refill on your cardiac medications before your next appointment, please call your pharmacy*   Lab Work: None ordered If you have labs (blood work) drawn today and your tests are completely normal, you will receive your results only by: [place] (if you have MyChart) OR A paper copy in the mail If you have any lab test that is abnormal or we need to change your treatment, we will call you to review the results.   Testing/Procedures: Your physician has recommended that you have a Cardioversion (DCCV). Electrical Cardioversion uses a jolt of electricity to your heart either through paddles or wired patches attached to your chest. This is a controlled, usually prescheduled, procedure. Defibrillation is done under light anesthesia in the hospital, and you usually go home the day of the procedure. This is done to get your heart back into a normal rhythm. You are not awake for the procedure. Please see the instructions below    Follow-Up: At [facility], you and your health needs are our priority.  As part of our continuing mission to provide you with exceptional heart care, we have created designated Provider Care Teams.  These Care Teams include your primary Cardiologist (physician) and Advanced Practice Providers (APPs -  Physician Assistants and Nurse Practitioners) who all work together to provide you with the care you need, when you need it.  Your next appointment:   3 month(s)  The format for your next appointment:   In Person  Provider:   Allegra Lai, MD    Thank you for choosing [facility]!!   Trinidad Curet, RN [phone]   Other Instructions    You are scheduled for a Cardioversion on 07/09/21 with Dr. Curt Bears.  Please arrive at the [facility] (Main Entrance A) at  [facility]: [address] [place], [place] [address] at 12:30 pm  DIET: Nothing to eat or drink after midnight except a sip of water with medications (see medication instructions below)  FYI: For your safety, and to allow [place] to monitor your vital signs accurately during the surgery/procedure we request that   if you have artificial nails, gel coating, SNS etc. Please have those removed prior to your surgery/procedure. Not having the nail coverings /polish removed may result in cancellation or delay of your surgery/procedure.   Medication Instructions: Hold Lasix  Continue your anticoagulant: Eliquis You will need to continue your anticoagulant after your procedure until you  are told by your provider that it is safe to stop   Labs: will have PCP fax results over from last week and scan into EPIC  You must have a responsible person to drive you home and stay in the waiting area during your procedure. Failure to do so could result in cancellation.  Bring your insurance cards.  *Special Note: Every effort is made to have your procedure done on time. Occasionally there are emergencies that occur at the hospital that may cause delays. Please be patient if a delay does occur.      Electrical Cardioversion Electrical cardioversion is the delivery of a jolt of electricity to restore a normal rhythm to the heart. A rhythm that is too fast or is not regular keeps the heart from pumping well. In this procedure, sticky patches or metal paddles are placed on the chest to deliver  electricity to the heart from a device. This procedure may be done in an emergency if: There is low or no blood pressure as a result of the heart rhythm. Normal rhythm must be restored as fast as possible to protect the brain and heart from further damage. It may save a life. This may also be a scheduled procedure for irregular or fast heart rhythms that are not immediately life-threatening. Tell a health care  provider about: Any allergies you have. All medicines you are taking, including vitamins, herbs, eye drops, creams, and over-the-counter medicines. Any problems you or family members have had with anesthetic medicines. Any blood disorders you have. Any surgeries you have had. Any medical conditions you have. Whether you are pregnant or may be pregnant. What are the risks? Generally, this is a safe procedure. However, problems may occur, including: Allergic reactions to medicines. A blood clot that breaks free and travels to other parts of your body. The possible return of an abnormal heart rhythm within hours or days after the procedure. Your heart stopping (cardiac arrest). This is rare. What happens before the procedure? Medicines Your health care provider may have you start taking: Blood-thinning medicines (anticoagulants) so your blood does not clot as easily. Medicines to help stabilize your heart rate and rhythm. Ask your health care provider about: Changing or stopping your regular medicines. This is especially important if you are taking diabetes medicines or blood thinners. Taking medicines such as aspirin and ibuprofen. These medicines can thin your blood. Do not take these medicines unless your health care provider tells you to take them. Taking over-the-counter medicines, vitamins, herbs, and supplements. General instructions Follow instructions from your health care provider about eating or drinking restrictions. Plan to have someone take you home from the hospital or clinic. If you will be going home right after the procedure, plan to have someone with you for 24 hours. Ask your health care provider what steps will be taken to help prevent infection. These may include washing your skin with a germ-killing soap. What happens during the procedure?  An IV will be inserted into one of your veins. Sticky patches (electrodes) or metal paddles may be placed on your chest. You  will be given a medicine to help you relax (sedative). An electrical shock will be delivered. The procedure may vary among health care providers and hospitals. What can I expect after the procedure? Your blood pressure, heart rate, breathing rate, and blood oxygen level will be monitored until you leave the hospital or clinic. Your heart rhythm will be watched to make sure it does not change. You may have some redness on the skin where the shocks were given. Follow these instructions at home: Do not drive for 24 hours if you were given a sedative during your procedure. Take over-the-counter and prescription medicines only as told by your health care provider. Ask your health care provider how to check your pulse. Check it often. Rest for 48 hours after the procedure or as told by your health care provider. Avoid or limit your caffeine use as told by your health care provider. Keep all follow-up visits as told by your health care provider. This is important. Contact a health care provider if: You feel like your heart is beating too quickly or your pulse is not regular. You have a serious muscle cramp that does not go away. Get help right away if: You have discomfort in your chest. You are dizzy or you feel faint. You have  trouble breathing or you are short of breath. Your speech is slurred. You have trouble moving an arm or leg on one side of your body. Your fingers or toes turn cold or blue. Summary Electrical cardioversion is the delivery of a jolt of electricity to restore a normal rhythm to the heart. This procedure may be done right away in an emergency or may be a scheduled procedure if the condition is not an emergency. Generally, this is a safe procedure. After the procedure, check your pulse often as told by your health care provider. This information is not intended to replace advice given to you by your health care provider. Make sure you discuss any questions you have with  your health care provider. Document Revised: 01/03/2019 Document Reviewed: 01/03/2019 Elsevier Patient Education  [place].

## 2021-07-09 ENCOUNTER — Ambulatory Visit (HOSPITAL_COMMUNITY): Payer: Medicare Other | Admitting: Anesthesiology

## 2021-07-09 ENCOUNTER — Other Ambulatory Visit: Payer: Self-pay

## 2021-07-09 ENCOUNTER — Ambulatory Visit (HOSPITAL_COMMUNITY)
Admission: RE | Admit: 2021-07-09 | Discharge: 2021-07-09 | Disposition: A | Discharge status: Home / Self Care | Payer: Medicare Other | Attending: Cardiology | Admitting: Cardiology

## 2021-07-09 ENCOUNTER — Encounter (HOSPITAL_COMMUNITY)
Admission: RE | Disposition: A | Discharge status: Home / Self Care | Payer: Self-pay | Source: Home / Self Care | Attending: Cardiology

## 2021-07-09 DIAGNOSIS — K219 Gastro-esophageal reflux disease without esophagitis: Secondary | ICD-10-CM | POA: Diagnosis not present

## 2021-07-09 DIAGNOSIS — I480 Paroxysmal atrial fibrillation: Secondary | ICD-10-CM | POA: Diagnosis present

## 2021-07-09 DIAGNOSIS — E785 Hyperlipidemia, unspecified: Secondary | ICD-10-CM | POA: Insufficient documentation

## 2021-07-09 DIAGNOSIS — I739 Peripheral vascular disease, unspecified: Secondary | ICD-10-CM | POA: Diagnosis not present

## 2021-07-09 DIAGNOSIS — I4892 Unspecified atrial flutter: Secondary | ICD-10-CM | POA: Insufficient documentation

## 2021-07-09 DIAGNOSIS — Z950 Presence of cardiac pacemaker: Secondary | ICD-10-CM | POA: Diagnosis not present

## 2021-07-09 DIAGNOSIS — I10 Essential (primary) hypertension: Secondary | ICD-10-CM | POA: Diagnosis not present

## 2021-07-09 DIAGNOSIS — I701 Atherosclerosis of renal artery: Secondary | ICD-10-CM | POA: Diagnosis not present

## 2021-07-09 DIAGNOSIS — Z87891 Personal history of nicotine dependence: Secondary | ICD-10-CM | POA: Insufficient documentation

## 2021-07-09 DIAGNOSIS — E039 Hypothyroidism, unspecified: Secondary | ICD-10-CM | POA: Insufficient documentation

## 2021-07-09 DIAGNOSIS — Z951 Presence of aortocoronary bypass graft: Secondary | ICD-10-CM | POA: Insufficient documentation

## 2021-07-09 DIAGNOSIS — I4819 Other persistent atrial fibrillation: Secondary | ICD-10-CM

## 2021-07-09 DIAGNOSIS — I2510 Atherosclerotic heart disease of native coronary artery without angina pectoris: Secondary | ICD-10-CM | POA: Insufficient documentation

## 2021-07-09 DIAGNOSIS — I495 Sick sinus syndrome: Secondary | ICD-10-CM | POA: Insufficient documentation

## 2021-07-09 DIAGNOSIS — Z79899 Other long term (current) drug therapy: Secondary | ICD-10-CM | POA: Diagnosis not present

## 2021-07-09 HISTORY — PX: CARDIOVERSION: EP1203

## 2021-07-09 LAB — PROTIME-INR
INR: 1.4 — ABNORMAL HIGH (ref 0.8–1.2)
Prothrombin Time: 17.3 seconds — ABNORMAL HIGH (ref 11.4–15.2)

## 2021-07-09 SURGERY — CARDIOVERSION (CATH LAB)
Anesthesia: General

## 2021-07-09 MED ORDER — PHENYLEPHRINE 40 MCG/ML (10ML) SYRINGE FOR IV PUSH (FOR BLOOD PRESSURE SUPPORT)
PREFILLED_SYRINGE | INTRAVENOUS | Status: DC | PRN
  Administered 2021-07-09: 120 ug via INTRAVENOUS

## 2021-07-09 MED ORDER — SODIUM CHLORIDE 0.9 % IV SOLN: INTRAVENOUS | Status: DC

## 2021-07-09 MED ORDER — PROPOFOL 10 MG/ML IV BOLUS
INTRAVENOUS | Status: DC | PRN
  Administered 2021-07-09: 100 mg via INTRAVENOUS

## 2021-07-09 MED ORDER — LIDOCAINE 2% (20 MG/ML) 5 ML SYRINGE
INTRAMUSCULAR | Status: DC | PRN
  Administered 2021-07-09: 60 mg via INTRAVENOUS

## 2021-07-09 SURGICAL SUPPLY — 1 items: PAD DEFIB RADIO PHYSIO CONN (PAD) ×2 IMPLANT

## 2021-07-09 NOTE — Transfer of Care (Signed)
Immediate Anesthesia Transfer of Care Note  Patient: Diane Valentine  Procedure(s) Performed: CARDIOVERSION (CATH LAB)  Patient Location: Cath Lab  Anesthesia Type:General  Level of Consciousness: awake, alert  and oriented  Airway & Oxygen Therapy: Patient Spontanous Breathing and Patient connected to nasal cannula oxygen  Post-op Assessment: Report given to RN and Post -op Vital signs reviewed and stable  Post vital signs: Reviewed and stable  Last Vitals:  Vitals Value Taken Time  BP 122/44 07/09/21 1418  Temp    Pulse 52 07/09/21 1421  Resp 14 07/09/21 1421  SpO2 94 % 07/09/21 1421  Vitals shown include unvalidated device data.  Last Pain:  Vitals:   07/09/21 1347  TempSrc:   PainSc: 0-No pain         Complications: No notable events documented.

## 2021-07-09 NOTE — Interval H&P Note (Signed)
History and Physical Interval Note:  07/09/2021 1:20 PM  Diane Valentine  has presented today for surgery, with the diagnosis of afib/flutter.  The various methods of treatment have been discussed with the patient and family. After consideration of risks, benefits and other options for treatment, the patient has consented to  Procedure(s): CARDIOVERSION (CATH LAB) (N/A) as a surgical intervention.  The patient's history has been reviewed, patient examined, no change in status, stable for surgery.  I have reviewed the patient's chart and labs.  Questions were answered to the patient's satisfaction.     Laverta Harnisch [employer]

## 2021-07-09 NOTE — Anesthesia Postprocedure Evaluation (Signed)
Anesthesia Post Note  Patient: Diane Valentine  Procedure(s) Performed: CARDIOVERSION (CATH LAB)     Patient location during evaluation: PACU Anesthesia Type: General Level of consciousness: awake and alert Pain management: pain level controlled Vital Signs Assessment: post-procedure vital signs reviewed and stable Respiratory status: spontaneous breathing, nonlabored ventilation and respiratory function stable Cardiovascular status: stable and blood pressure returned to baseline Anesthetic complications: no   There were no known notable events for this encounter.  Last Vitals:  Vitals:   07/09/21 1506 07/09/21 1525  BP: (!) 157/100 (!) 142/90  Pulse: (!) 54 (!) 54  Resp:    Temp:    SpO2: 90% 95%    Last Pain:  Vitals:   07/09/21 1505  TempSrc: Oral  PainSc: 0-No pain                 Audry Pili

## 2021-07-09 NOTE — Anesthesia Procedure Notes (Signed)
Procedure Name: General with mask airway Date/Time: 07/09/2021 2:00 PM Performed by: Macie Burows, CRNA Pre-anesthesia Checklist: Patient identified, Emergency Drugs available, Suction available, Patient being monitored and Timeout performed Patient Re-evaluated:Patient Re-evaluated prior to induction Oxygen Delivery Method: Ambu bag Preoxygenation: Pre-oxygenation with 100% oxygen Induction Type: IV induction Ventilation: Mask ventilation without difficulty Placement Confirmation: positive ETCO2 and breath sounds checked- equal and bilateral Dental Injury: Teeth and Oropharynx as per pre-operative assessment

## 2021-07-09 NOTE — Anesthesia Preprocedure Evaluation (Addendum)
Anesthesia Evaluation  Patient identified by MRN, date of birth, ID band Patient awake    Reviewed: Allergy & Precautions, NPO status , Patient's Chart, lab work & pertinent test results  History of Anesthesia Complications Negative for: history of anesthetic complications  Airway Mallampati: III  TM Distance: >3 FB Neck ROM: Full    Dental  (+) Dental Advisory Given   Pulmonary former smoker,    Pulmonary exam normal        Cardiovascular hypertension, Pt. on medications and Pt. on home beta blockers + CAD, + CABG and + Peripheral Vascular Disease  + dysrhythmias Atrial Fibrillation + pacemaker (for SSS)  Rhythm:Irregular Rate:Normal   '20 TTE - Mild hypokinesis of the left ventricular, basal-mid inferoseptal wall. EF 50-55%. Left ventricular diastolic Doppler parameters are consistent with pseudonormalization. Left atrial size was mildly dilated. Mild MR and TR.    Neuro/Psych negative neurological ROS  negative psych ROS   GI/Hepatic GERD  Medicated and Controlled,(+)     substance abuse  alcohol use,   Endo/Other  Hypothyroidism   Renal/GU Renal disease (RAS)     Musculoskeletal negative musculoskeletal ROS (+)   Abdominal   Peds  Hematology  On eliquis    Anesthesia Other Findings   Reproductive/Obstetrics                            Anesthesia Physical Anesthesia Plan  ASA: 3  Anesthesia Plan: General   Post-op Pain Management:    Induction: Intravenous  PONV Risk Score and Plan: 3 and Treatment may vary due to age or medical condition and Propofol infusion  Airway Management Planned: Mask and Natural Airway  Additional Equipment: None  Intra-op Plan:   Post-operative Plan:   Informed Consent: I have reviewed the patients History and Physical, chart, labs and discussed the procedure including the risks, benefits and alternatives for the proposed anesthesia with the  patient or authorized representative who has indicated his/her understanding and acceptance.       Plan Discussed with: CRNA and Anesthesiologist  Anesthesia Plan Comments:        Anesthesia Quick Evaluation

## 2021-07-10 ENCOUNTER — Encounter (HOSPITAL_COMMUNITY): Payer: Self-pay | Admitting: Cardiology

## 2021-07-12 NOTE — Procedures (Signed)
Electrical Cardioversion Procedure Note Diane Valentine [id number] [birth year]  Procedure: Electrical Cardioversion Indications:  Atrial Flutter  Procedure Details Consent: Risks of procedure as well as the alternatives and risks of each were explained to the (patient/caregiver).  Consent for procedure obtained. Time Out: Verified patient identification, verified procedure, site/side was marked, verified correct patient position, special equipment/implants available, medications/allergies/relevent history reviewed, required imaging and test results available.  Performed  Patient placed on cardiac monitor, pulse oximetry, supplemental oxygen as necessary.  Sedation given:  propofol Pacer pads placed anterior and posterior chest.  Cardioverted 1 time(s).  Cardioverted at 200J.  Evaluation Findings: Post procedure EKG shows: NSR Complications: None Patient did tolerate procedure well.   Diane Valentine Diane Valentine 07/12/2021, 1:42 PM

## 2021-07-16 ENCOUNTER — Encounter: Payer: Self-pay | Admitting: Cardiology

## 2021-07-19 DIAGNOSIS — M75121 Complete rotator cuff tear or rupture of right shoulder, not specified as traumatic: Secondary | ICD-10-CM | POA: Insufficient documentation

## 2021-07-31 ENCOUNTER — Telehealth: Payer: Self-pay | Admitting: Cardiology

## 2021-07-31 NOTE — Telephone Encounter (Signed)
Patient is calling because she is having surgery [facility] in [place].  She needs the last pace maker reading fax over to [place] at fax # [phone] Diane Valentine. They are going to turn off her pace maker during surgery.

## 2021-07-31 NOTE — Telephone Encounter (Signed)
Information faxed

## 2021-08-01 ENCOUNTER — Other Ambulatory Visit: Payer: Self-pay | Admitting: Cardiology

## 2021-08-20 ENCOUNTER — Encounter: Payer: Self-pay | Admitting: Cardiology

## 2021-08-20 NOTE — Telephone Encounter (Signed)
Remote transmission reviewed. According to AT/AF burden trend, trends appear stable and no increased noted. Sending to Dr. Estill Dooms considering presenting rhythm (questionable slow AFL or AT?) ? ?Patient updated that transmission was received and would follow up once I hear back with Dr. Elberta Fortis. ? ? ? ? ? ? ? ? ? ? ?

## 2021-08-26 NOTE — Progress Notes (Unsigned)
Electrophysiology Office Note Date: 08/26/2021  ID:  Rhiannah Dalsanto, DOB 1950/11/11, MRN [id number]  PCP: Charleston Poot, MD Primary Cardiologist: Berniece Salines, DO Electrophysiologist: Will Meredith Leeds, MD   CC: Pacemaker follow-up  Diane Valentine is a 71 y.o. female seen today for Will Meredith Leeds, MD for acute visit due to atypical atrial flutter on device .  Since last being seen in our clinic the patient reports doing ***.  she denies chest pain, palpitations, dyspnea, PND, orthopnea, nausea, vomiting, dizziness, syncope, edema, weight gain, or early satiety.  Device History: Medtronic Dual Chamber PPM implanted 02/2018 for SSS/Brady-tachy  Past Medical History:  Diagnosis Date   CAD in native artery 01/02/2019   Carotid stenosis, bilateral 01/02/2019   Essential hypertension 01/02/2019   History of amiodarone therapy 01/02/2019   Hx of CABG 01/02/2019   Hyperlipidemia 01/02/2019   PAF (paroxysmal atrial fibrillation) ([place]) 01/02/2019   Presence of permanent cardiac pacemaker    Renal artery stenosis ([place]) 01/02/2019   Sick sinus syndrome ([place]) 01/02/2019   Past Surgical History:  Procedure Laterality Date   ATRIAL FIBRILLATION ABLATION N/A 03/22/2019   Procedure: ATRIAL FIBRILLATION ABLATION;  Surgeon: Constance Haw, MD;  Location: [place] [facility];  Service: Cardiovascular;  Laterality: N/A;   ATRIAL FIBRILLATION ABLATION N/A 04/04/2020   Procedure: ATRIAL FIBRILLATION ABLATION;  Surgeon: Constance Haw, MD;  Location: [place] [facility];  Service: Cardiovascular;  Laterality: N/A;   BOWEL RESECTION     CARDIOVERSION     CARDIOVERSION N/A 07/09/2021   Procedure: CARDIOVERSION (CATH LAB);  Surgeon: Constance Haw, MD;  Location: [place] [facility];  Service: Cardiovascular;  Laterality: N/A;   CORONARY ANGIOPLASTY WITH STENT PLACEMENT     CORONARY ARTERY BYPASS GRAFT  2016   x 3 vessels   CRYOABLATION     INSERT / REPLACE / REMOVE PACEMAKER       Current Outpatient Medications  Medication Sig Dispense Refill   acetaminophen (TYLENOL) 500 MG tablet Take 1,000 mg by mouth every 6 (six) hours as needed for moderate pain or headache.     albuterol (VENTOLIN HFA) 108 (90 Base) MCG/ACT inhaler Inhale 2 puffs into the lungs every 6 (six) hours as needed for wheezing or shortness of breath.      allopurinol (ZYLOPRIM) 100 MG tablet Take 100 mg by mouth daily.     ALPRAZolam (XANAX) 0.25 MG tablet Take 0.5 mg by mouth at bedtime.     aspirin EC 81 MG tablet Take 81 mg by mouth daily.      atorvastatin (LIPITOR) 40 MG tablet Take 40 mg by mouth every evening.     Biotin 10000 MCG TBDP Take 10,000 mcg by mouth at bedtime.     carvedilol (COREG) 12.5 MG tablet TAKE 1 TABLET BY MOUTH TWICE A DAY 180 tablet 2   cloNIDine (CATAPRES) 0.1 MG tablet Take 0.1 mg by mouth 2 (two) times daily.     ELIQUIS 5 MG TABS tablet TAKE 1 TABLET BY MOUTH TWICE A DAY 180 tablet 1   ferrous sulfate 325 (65 FE) MG tablet Take 1 tablet (325 mg total) by mouth daily. 30 tablet 0   furosemide (LASIX) 40 MG tablet Take 40 mg by mouth 2 (two) times daily.     levothyroxine (SYNTHROID) 75 MCG tablet Take 75 mcg by mouth daily before breakfast.      losartan (COZAAR) 100 MG tablet Take 100 mg by mouth daily.     Misc  Natural Products (OSTEO BI-FLEX ADV TRIPLE ST) TABS Take 1 tablet by mouth at bedtime.     nitroGLYCERIN (NITROSTAT) 0.4 MG SL tablet Place 0.4 mg under the tongue every 5 (five) minutes x 3 doses as needed for chest pain.      omeprazole (PRILOSEC) 20 MG capsule Take 20 mg by mouth 2 (two) times daily.     Polyethyl Glycol-Propyl Glycol (SYSTANE) 0.4-0.3 % SOLN Place 1 drop into both eyes daily as needed (Dry eye).     potassium chloride (MICRO-K) 10 MEQ CR capsule Take 10 mEq by mouth every other day. In the morning     pramipexole (MIRAPEX) 0.5 MG tablet Take 0.5 mg by mouth at bedtime.     saline (AYR) GEL Place 1 application into the nose daily as  needed (dryness).     sotalol (BETAPACE) 120 MG tablet Take 1 tablet (120 mg total) by mouth 2 (two) times daily. 60 tablet 6   traZODone (DESYREL) 50 MG tablet Take 50 mg by mouth at bedtime as needed for sleep.     vitamin B-12 (CYANOCOBALAMIN) 1000 MCG tablet Take 1,000 mcg by mouth daily.     No current facility-administered medications for this visit.    Allergies:   Tizanidine, Ace inhibitors, Hydrocodone bit-homatrop mbr, and Rofecoxib   Social History: Social History   Socioeconomic History   Marital status: Married    Spouse name: Not on file   Number of children: Not on file   Years of education: Not on file   Highest education level: Not on file  Occupational History   Not on file  Tobacco Use   Smoking status: Former    Packs/day: 0.75    Years: 45.00    Pack years: 33.75    Types: Cigarettes    Quit date: 2009    Years since quitting: 14.2   Smokeless tobacco: Never   Tobacco comments:    Former smoker 02/07/21  Vaping Use   Vaping Use: Never used  Substance and Sexual Activity   Alcohol use: Yes    Alcohol/week: 2.0 standard drinks    Types: 2 Cans of beer per week    Comment: 2 beers daily   Drug use: Never   Sexual activity: Not on file  Other Topics Concern   Not on file  Social History Narrative   Not on file   Social Determinants of Health   Financial Resource Strain: Not on file  Food Insecurity: Not on file  Transportation Needs: Not on file  Physical Activity: Not on file  Stress: Not on file  Social Connections: Not on file  Intimate Partner Violence: Not on file    Family History: Family History  Problem Relation Age of Onset   Hypertension Mother    Heart attack Father 38   Hypertension Sister    Hypertension Sister    Hypertension Daughter    Hypertension Son      Review of Systems: All other systems reviewed and are otherwise negative except as noted above.  Physical Exam: There were no vitals filed for this visit.    GEN- The patient is well appearing, alert and oriented x 3 today.   HEENT: normocephalic, atraumatic; sclera clear, conjunctiva pink; hearing intact; oropharynx clear; neck supple  Lungs- Clear to ausculation bilaterally, normal work of breathing.  No wheezes, rales, rhonchi Heart- Regular rate and rhythm, no murmurs, rubs or gallops  GI- soft, non-tender, non-distended, bowel sounds present  Extremities- no clubbing or  cyanosis. No edema MS- no significant deformity or atrophy Skin- warm and dry, no rash or lesion; PPM pocket well healed Psych- euthymic mood, full affect Neuro- strength and sensation are intact  PPM Interrogation- reviewed in detail today,  See PACEART report  EKG:  EKG {ACTION; IS/IS [id number] ordered today. Personal review of ekg ordered {Blank single:19197::"today","***"} shows ***   Recent Labs: 02/07/2021: Hemoglobin 7.9; Platelets 290 05/13/2021: BUN 39; Creatinine, Ser 1.44; Magnesium 2.4; Potassium 4.0; Sodium 137   Wt Readings from Last 3 Encounters:  07/09/21 180 lb (81.6 kg)  07/08/21 184 lb (83.5 kg)  05/13/21 183 lb 1.9 oz (83.1 kg)     Other studies Reviewed: Additional studies/ records that were reviewed today include: Previous EP office notes, Previous remote checks, Most recent labwork.   Assessment and Plan:  1. Sick sinus syndrome s/p St. Jude PPM  Normal PPM function See Pace Art report No changes today  2. PAF / Atrial tachycardia EKG today shows *** S/p [facility] 07/09/2021 Continue sotalol 120 mg BID CHA2DS2-VASc of 4  3. HTN Stable on current regimen   4. HLD Continue statin per primary cardiology  Current medicines are reviewed at length with the patient today.    Labs/ tests ordered today include: *** No orders of the defined types were placed in this encounter.    Disposition:   Follow up with {Blank single:19197::"Dr. Allred","Dr. Arlan Organ. Klein","Dr. Camnitz","Dr. Lambert","EP APP"} in {Blank single:19197::"2  weeks","4 weeks","3 months","6 months","12 months","as usual post gen change"}    Signed, Annamaria Helling  08/26/2021 9:26 AM  [facility] [address] [place] [address] [phone] (office) [phone] (fax)

## 2021-08-28 ENCOUNTER — Ambulatory Visit (INDEPENDENT_AMBULATORY_CARE_PROVIDER_SITE_OTHER): Payer: Medicare Other | Admitting: Student

## 2021-08-28 ENCOUNTER — Other Ambulatory Visit: Payer: Self-pay

## 2021-08-28 ENCOUNTER — Encounter: Payer: Self-pay | Admitting: Student

## 2021-08-28 VITALS — BP 122/76 | HR 52 | Ht 66.0 in | Wt 196.2 lb

## 2021-08-28 DIAGNOSIS — I495 Sick sinus syndrome: Secondary | ICD-10-CM

## 2021-08-28 DIAGNOSIS — I4819 Other persistent atrial fibrillation: Secondary | ICD-10-CM | POA: Diagnosis not present

## 2021-08-28 DIAGNOSIS — I484 Atypical atrial flutter: Secondary | ICD-10-CM | POA: Diagnosis not present

## 2021-08-28 DIAGNOSIS — R5383 Other fatigue: Secondary | ICD-10-CM | POA: Diagnosis not present

## 2021-08-28 DIAGNOSIS — R0600 Dyspnea, unspecified: Secondary | ICD-10-CM

## 2021-08-28 LAB — CUP PACEART INCLINIC DEVICE CHECK
Battery Remaining Longevity: 79 mo
Battery Voltage: 2.99 V
Brady Statistic RA Percent Paced: 35 %
Brady Statistic RV Percent Paced: 56 %
Date Time Interrogation Session: 20230315121717
Implantable Lead Implant Date: 20190909
Implantable Lead Implant Date: 20190909
Implantable Lead Location: 753859
Implantable Lead Location: 753860
Implantable Pulse Generator Implant Date: 20190909
Lead Channel Impedance Value: 437.5 Ohm
Lead Channel Impedance Value: 525 Ohm
Lead Channel Pacing Threshold Amplitude: 0.75 V
Lead Channel Pacing Threshold Amplitude: 0.75 V
Lead Channel Pacing Threshold Amplitude: 1 V
Lead Channel Pacing Threshold Amplitude: 1 V
Lead Channel Pacing Threshold Pulse Width: 0.4 ms
Lead Channel Pacing Threshold Pulse Width: 0.4 ms
Lead Channel Pacing Threshold Pulse Width: 0.4 ms
Lead Channel Pacing Threshold Pulse Width: 0.4 ms
Lead Channel Sensing Intrinsic Amplitude: 12 mV
Lead Channel Sensing Intrinsic Amplitude: 2.2 mV
Lead Channel Setting Pacing Amplitude: 2 V
Lead Channel Setting Pacing Amplitude: 2.5 V
Lead Channel Setting Pacing Pulse Width: 0.4 ms
Lead Channel Setting Sensing Sensitivity: 2 mV
Pulse Gen Model: 2272
Pulse Gen Serial Number: 9053085

## 2021-08-28 LAB — CBC
Hematocrit: 38.3 % (ref 34.0–46.6)
Hemoglobin: 13 g/dL (ref 11.1–15.9)
MCH: 32.4 pg (ref 26.6–33.0)
MCHC: 33.9 g/dL (ref 31.5–35.7)
MCV: 96 fL (ref 79–97)
Platelets: 149 10*3/uL — ABNORMAL LOW (ref 150–450)
RBC: 4.01 x10E6/uL (ref 3.77–5.28)
RDW: 12.4 % (ref 11.7–15.4)
WBC: 6 10*3/uL (ref 3.4–10.8)

## 2021-08-28 LAB — BASIC METABOLIC PANEL
BUN/Creatinine Ratio: 26 (ref 12–28)
BUN: 43 mg/dL — ABNORMAL HIGH (ref 8–27)
CO2: 24 mmol/L (ref 20–29)
Calcium: 10.1 mg/dL (ref 8.7–10.3)
Chloride: 94 mmol/L — ABNORMAL LOW (ref 96–106)
Creatinine, Ser: 1.63 mg/dL — ABNORMAL HIGH (ref 0.57–1.00)
Glucose: 104 mg/dL — ABNORMAL HIGH (ref 70–99)
Potassium: 4.5 mmol/L (ref 3.5–5.2)
Sodium: 132 mmol/L — ABNORMAL LOW (ref 134–144)
eGFR: 33 mL/min/{1.73_m2} — ABNORMAL LOW (ref >59)

## 2021-08-28 LAB — TSH: TSH: 4.99 u[IU]/mL — ABNORMAL HIGH (ref 0.450–4.500)

## 2021-08-28 MED ORDER — METOPROLOL SUCCINATE ER 50 MG PO TB24: 50.0000 mg | ORAL_TABLET | Freq: Every day | ORAL | 2 refills | Status: DC

## 2021-08-28 NOTE — Patient Instructions (Signed)
Medication Instructions:  ? ? STOP TAKING COREG  ?  ? START TAKING TOPROL XL 50 MG  MG ONCE A DAY AT BEDTIME  ? ? START TAKING LOSARTAN AT BED TIME  ? ?*If you need a refill on your cardiac medications before your next appointment, please call your pharmacy* ? ? ?Lab Work:  BMET CBC AND TSH TODAY  ? ?If you have labs (blood work) drawn today and your tests are completely normal, you will receive your results only by: ?MyChart Message (if you have MyChart) OR ?A paper copy in the mail ?If you have any lab test that is abnormal or we need to change your treatment, we will call you to review the results. ? ? ?Testing/Procedures: NONE ORDERED  TODAY ? ? ? ? ?Follow-Up: ?At [facility], you and your health needs are our priority.  As part of our continuing mission to provide you with exceptional heart care, we have created designated Provider Care Teams.  These Care Teams include your primary Cardiologist (physician) and Advanced Practice Providers (APPs -  Physician Assistants and Nurse Practitioners) who all work together to provide you with the care you need, when you need it. ? ?We recommend signing up for the patient portal called "MyChart".  Sign up information is provided on this After Visit Summary.  MyChart is used to connect with patients for Virtual Visits (Telemedicine).  Patients are able to view lab/test results, encounter notes, upcoming appointments, etc.  Non-urgent messages can be sent to your provider as well.   ?To learn more about what you can do with MyChart, go to [email or web address].   ? ?Your next appointment:   AS SCHEDULED WITH CAMNITZ  ?}  ? ? ?Other Instructions ? ?

## 2021-09-04 ENCOUNTER — Ambulatory Visit (INDEPENDENT_AMBULATORY_CARE_PROVIDER_SITE_OTHER): Payer: Medicare Other

## 2021-09-04 DIAGNOSIS — I495 Sick sinus syndrome: Secondary | ICD-10-CM | POA: Diagnosis not present

## 2021-09-05 LAB — CUP PACEART REMOTE DEVICE CHECK
Battery Remaining Longevity: 78 mo
Battery Remaining Percentage: 64 %
Battery Voltage: 3.01 V
Brady Statistic AP VP Percent: 47 %
Brady Statistic AP VS Percent: 2.9 %
Brady Statistic AS VP Percent: 17 %
Brady Statistic AS VS Percent: 32 %
Brady Statistic RA Percent Paced: 50 %
Brady Statistic RV Percent Paced: 65 %
Date Time Interrogation Session: 20230322020018
Implantable Lead Implant Date: 20190909
Implantable Lead Implant Date: 20190909
Implantable Lead Location: 753859
Implantable Lead Location: 753860
Implantable Pulse Generator Implant Date: 20190909
Lead Channel Impedance Value: 430 Ohm
Lead Channel Impedance Value: 530 Ohm
Lead Channel Pacing Threshold Amplitude: 0.75 V
Lead Channel Pacing Threshold Amplitude: 1 V
Lead Channel Pacing Threshold Pulse Width: 0.4 ms
Lead Channel Pacing Threshold Pulse Width: 0.4 ms
Lead Channel Sensing Intrinsic Amplitude: 12 mV
Lead Channel Sensing Intrinsic Amplitude: 2.4 mV
Lead Channel Setting Pacing Amplitude: 2 V
Lead Channel Setting Pacing Amplitude: 2.5 V
Lead Channel Setting Pacing Pulse Width: 0.4 ms
Lead Channel Setting Sensing Sensitivity: 2 mV
Pulse Gen Model: 2272
Pulse Gen Serial Number: 9053085

## 2021-09-09 ENCOUNTER — Telehealth: Payer: Self-pay

## 2021-09-09 NOTE — Telephone Encounter (Signed)
**Note De-Identified [name] Obfuscation** Letter received Diane Valentine fax from [facility] stating that they have approved the pt for asst with Eliquis until 06/15/2022. ?BMSPAF Case #: [id number] ? ?The letter states that they have notified the pt of this approval as well. ?

## 2021-09-17 ENCOUNTER — Ambulatory Visit (HOSPITAL_BASED_OUTPATIENT_CLINIC_OR_DEPARTMENT_OTHER)
Admission: RE | Admit: 2021-09-17 | Discharge: 2021-09-17 | Disposition: A | Discharge status: Home / Self Care | Payer: Medicare Other | Source: Ambulatory Visit | Attending: Student | Admitting: Student

## 2021-09-17 DIAGNOSIS — R5383 Other fatigue: Secondary | ICD-10-CM | POA: Diagnosis not present

## 2021-09-17 DIAGNOSIS — R0609 Other forms of dyspnea: Secondary | ICD-10-CM | POA: Diagnosis not present

## 2021-09-17 DIAGNOSIS — R0600 Dyspnea, unspecified: Secondary | ICD-10-CM | POA: Diagnosis present

## 2021-09-17 NOTE — Progress Notes (Signed)
?  Echocardiogram ?2D Echocardiogram has been performed. ? ?Elmer Ramp ?09/17/2021, 4:10 PM ?

## 2021-09-18 LAB — ECHOCARDIOGRAM COMPLETE
AR max vel: 1.59 cm2
AV Area VTI: 1.67 cm2
AV Area mean vel: 1.7 cm2
AV Mean grad: 7 mmHg
AV Peak grad: 17.1 mmHg
Ao pk vel: 2.07 m/s
Area-P 1/2: 3.37 cm2
S' Lateral: 3.5 cm

## 2021-09-18 NOTE — Progress Notes (Signed)
Remote pacemaker transmission.   

## 2021-09-29 ENCOUNTER — Encounter: Payer: Self-pay | Admitting: Cardiology

## 2021-09-30 MED ORDER — SOTALOL HCL 120 MG PO TABS: 120.0000 mg | ORAL_TABLET | Freq: Two times a day (BID) | ORAL | 1 refills | Status: DC

## 2021-09-30 NOTE — Addendum Note (Signed)
Addended by: Burnetta Sabin on: 09/30/2021 09:49 AM ? ? Modules accepted: Orders ? ?

## 2021-10-07 ENCOUNTER — Ambulatory Visit: Payer: Medicare Other | Admitting: Cardiology

## 2021-11-04 ENCOUNTER — Ambulatory Visit (INDEPENDENT_AMBULATORY_CARE_PROVIDER_SITE_OTHER): Payer: Medicare Other | Admitting: Cardiology

## 2021-11-04 ENCOUNTER — Encounter: Payer: Self-pay | Admitting: Cardiology

## 2021-11-04 VITALS — BP 128/68 | HR 54 | Ht 66.0 in | Wt 181.0 lb

## 2021-11-04 DIAGNOSIS — I495 Sick sinus syndrome: Secondary | ICD-10-CM

## 2021-11-04 DIAGNOSIS — D6869 Other thrombophilia: Secondary | ICD-10-CM

## 2021-11-04 DIAGNOSIS — I480 Paroxysmal atrial fibrillation: Secondary | ICD-10-CM

## 2021-11-04 NOTE — Patient Instructions (Signed)
Medication Instructions:  Your physician recommends that you continue on your current medications as directed. Please refer to the Current Medication list given to you today.  *If you need a refill on your cardiac medications before your next appointment, please call your pharmacy*   Lab Work: None ordered  Testing/Procedures: None ordered   Follow-Up: At [facility], you and your health needs are our priority.  As part of our continuing mission to provide you with exceptional heart care, we have created designated Provider Care Teams.  These Care Teams include your primary Cardiologist (physician) and Advanced Practice Providers (APPs -  Physician Assistants and Nurse Practitioners) who all work together to provide you with the care you need, when you need it.  Remote monitoring is used to monitor your Pacemaker or ICD from home. This monitoring reduces the number of office visits required to check your device to one time per year. It allows [place] to keep an eye on the functioning of your device to ensure it is working properly. You are scheduled for a device check from home on 12/04/2021. You may send your transmission at any time that day. If you have a wireless device, the transmission will be sent automatically. After your physician reviews your transmission, you will receive a postcard with your next transmission date.  Your next appointment:   6 month(s)  The format for your next appointment:   In Person  Provider:   Allegra Lai, MD    Thank you for choosing [facility]!!   Trinidad Curet, RN [phone]    Other Instructions   Important Information About Sugar

## 2021-11-04 NOTE — Progress Notes (Signed)
Electrophysiology Office Note   Date:  11/04/2021   ID:  Abiela Rollinger, DOB [birth year], MRN [id number]  PCP:  Charleston Poot, MD  Cardiologist:  Bettina Gavia Primary Electrophysiologist:  Beckham Capistran Meredith Leeds, MD    No chief complaint on file.     History of Present Illness: Diane Valentine is a 71 y.o. female who is being seen today for the evaluation of pacemaker at the request of Charleston Poot, MD. Presenting today for electrophysiology evaluation.  She has a history significant for paroxysmal atrial fibrillation, atrial flutter, sick sinus syndrome, coronary artery disease status post CABG, hypertension, renal artery stenosis, hyperlipidemia.  She is status post Public house manager.  She had cryoablation in 2019.  She had repeat RF ablations 03/22/2019 and 04/05/2020.  Today, denies symptoms of palpitations, chest pain, shortness of breath, orthopnea, PND, lower extremity edema, claudication, dizziness, presyncope, syncope, bleeding, or neurologic sequela. The patient is tolerating medications without difficulties.  Since being seen she has done well.  She has had no chest pain or shortness of breath.  She is remained in sinus rhythm.  She states that her energy is much better in sinus rhythm.  He is overall happy with how she has been feeling.   Past Medical History:  Diagnosis Date   CAD in native artery 01/02/2019   Carotid stenosis, bilateral 01/02/2019   Essential hypertension 01/02/2019   History of amiodarone therapy 01/02/2019   Hx of CABG 01/02/2019   Hyperlipidemia 01/02/2019   PAF (paroxysmal atrial fibrillation) ([place]) 01/02/2019   Presence of permanent cardiac pacemaker    Renal artery stenosis ([place]) 01/02/2019   Sick sinus syndrome ([place]) 01/02/2019   Past Surgical History:  Procedure Laterality Date   ATRIAL FIBRILLATION ABLATION N/A 03/22/2019   Procedure: ATRIAL FIBRILLATION ABLATION;  Surgeon: Constance Haw, MD;  Location: [place] [facility];   Service: Cardiovascular;  Laterality: N/A;   ATRIAL FIBRILLATION ABLATION N/A 04/04/2020   Procedure: ATRIAL FIBRILLATION ABLATION;  Surgeon: Constance Haw, MD;  Location: [place] [facility];  Service: Cardiovascular;  Laterality: N/A;   BOWEL RESECTION     CARDIOVERSION     CARDIOVERSION N/A 07/09/2021   Procedure: CARDIOVERSION (CATH LAB);  Surgeon: Constance Haw, MD;  Location: [place] [facility];  Service: Cardiovascular;  Laterality: N/A;   CORONARY ANGIOPLASTY WITH STENT PLACEMENT     CORONARY ARTERY BYPASS GRAFT  2016   x 3 vessels   CRYOABLATION     INSERT / REPLACE / REMOVE PACEMAKER       Current Outpatient Medications  Medication Sig Dispense Refill   acetaminophen (TYLENOL) 500 MG tablet Take 1,000 mg by mouth every 6 (six) hours as needed for moderate pain or headache.     albuterol (VENTOLIN HFA) 108 (90 Base) MCG/ACT inhaler Inhale 2 puffs into the lungs every 6 (six) hours as needed for wheezing or shortness of breath.      allopurinol (ZYLOPRIM) 100 MG tablet Take 100 mg by mouth daily.     ALPRAZolam (XANAX) 0.5 MG tablet Take 0.5 mg by mouth at bedtime.     aspirin EC 81 MG tablet Take 81 mg by mouth daily.      atorvastatin (LIPITOR) 40 MG tablet Take 40 mg by mouth every evening.     Biotin 10000 MCG TBDP Take 10,000 mcg by mouth at bedtime.     cloNIDine (CATAPRES) 0.1 MG tablet Take 0.1 mg by mouth 2 (two) times daily.     ELIQUIS  5 MG TABS tablet TAKE 1 TABLET BY MOUTH TWICE A DAY 180 tablet 1   furosemide (LASIX) 40 MG tablet Take 40 mg by mouth 2 (two) times daily.     levothyroxine (SYNTHROID) 75 MCG tablet Take 75 mcg by mouth daily before breakfast.      losartan (COZAAR) 100 MG tablet Take 100 mg by mouth at bedtime.     metoprolol succinate (TOPROL XL) 50 MG 24 hr tablet Take 1 tablet (50 mg total) by mouth at bedtime. 90 tablet 2   Misc Natural Products (OSTEO BI-FLEX ADV TRIPLE ST) TABS Take 1 tablet by mouth at bedtime.     nitroGLYCERIN  (NITROSTAT) 0.4 MG SL tablet Place 0.4 mg under the tongue every 5 (five) minutes x 3 doses as needed for chest pain.      omeprazole (PRILOSEC) 20 MG capsule Take 20 mg by mouth 2 (two) times daily.     Polyethyl Glycol-Propyl Glycol (SYSTANE) 0.4-0.3 % SOLN Place 1 drop into both eyes daily as needed (Dry eye).     potassium chloride (MICRO-K) 10 MEQ CR capsule Take 10 mEq by mouth every other day. In the morning     pramipexole (MIRAPEX) 0.5 MG tablet Take 0.5 mg by mouth at bedtime.     saline (AYR) GEL Place 1 application into the nose daily as needed (dryness).     sotalol (BETAPACE) 120 MG tablet Take 1 tablet (120 mg total) by mouth 2 (two) times daily. 180 tablet 1   traZODone (DESYREL) 50 MG tablet Take 50 mg by mouth at bedtime as needed for sleep.     vitamin B-12 (CYANOCOBALAMIN) 1000 MCG tablet Take 1,000 mcg by mouth daily.     No current facility-administered medications for this visit.    Allergies:   Tizanidine, Ace inhibitors, Hydrocodone bit-homatrop mbr, and Rofecoxib   Social History:  The patient  reports that she quit smoking about 14 years ago. Her smoking use included cigarettes. She has a 33.75 pack-year smoking history. She has never used smokeless tobacco. She reports current alcohol use of about 2.0 standard drinks per week. She reports that she does not use drugs.   Family History:  The patient's family history includes Heart attack (age of onset: 24) in her father; Hypertension in her daughter, mother, sister, sister, and son.   ROS:  Please see the history of present illness.   Otherwise, review of systems is positive for none.   All other systems are reviewed and negative.   PHYSICAL EXAM: VS:  BP 128/68   Pulse (!) 54   Ht 5\' 6"  (1.676 m)   Wt 181 lb (82.1 kg)   SpO2 95%   BMI 29.21 kg/m  , BMI Body mass index is 29.21 kg/m. GEN: Well nourished, well developed, in no acute distress  HEENT: normal  Neck: no JVD, carotid bruits, or masses Cardiac:  RRR; no murmurs, rubs, or gallops,no edema  Respiratory:  clear to auscultation bilaterally, normal work of breathing GI: soft, nontender, nondistended, + BS MS: no deformity or atrophy  Skin: warm and dry, device site well healed Neuro:  Strength and sensation are intact Psych: euthymic mood, full affect  EKG:  EKG is not ordered today. Personal review of the ekg ordered 08/28/21 shows AV paced  Personal review of the device interrogation today. Results in [place]: 05/13/2021: Magnesium 2.4 08/28/2021: BUN 43; Creatinine, Ser 1.63; Hemoglobin 13.0; Platelets 149; Potassium 4.5; Sodium 132; TSH 4.990  Lipid Panel     Component Value Date/Time   CHOL 167 01/03/2019 1713   TRIG 133 01/03/2019 1713   HDL 61 01/03/2019 1713   CHOLHDL 2.7 01/03/2019 1713   LDLCALC 79 01/03/2019 1713     Wt Readings from Last 3 Encounters:  11/04/21 181 lb (82.1 kg)  08/28/21 196 lb 3.2 oz (89 kg)  07/09/21 180 lb (81.6 kg)      Other studies Reviewed: Additional studies/ records that were reviewed today include: TTE 09/18/21 Review of the above records today demonstrates:   1. Left ventricular ejection fraction, by estimation, is 60 to 65%. The  left ventricle has normal function. The left ventricle has no regional  wall motion abnormalities. There is mild left ventricular hypertrophy.  Left ventricular diastolic parameters  are consistent with Grade II diastolic dysfunction (pseudonormalization).   2. Right ventricular systolic function is normal. The right ventricular  size is normal. There is mildly elevated pulmonary artery systolic  pressure.   3. Left atrial size was severely dilated.   4. Right atrial size was moderately dilated.   5. The mitral valve is normal in structure. Mild mitral valve  regurgitation. No evidence of mitral stenosis.   6. The aortic valve is normal in structure. Aortic valve regurgitation is  not visualized. Aortic valve sclerosis/calcification  is present, without  any evidence of aortic stenosis.   7. The inferior vena cava is normal in size with greater than 50%  respiratory variability, suggesting right atrial pressure of 3 mmHg.   ASSESSMENT AND PLAN:  1.  Sick sinus syndrome: Status post Saint Jude dual-chamber pacemaker.  Device function appropriately.  No changes at this time.  2.  Paroxysmal atrial fibrillation: Status post cryoablation with 2 subsequent RF ablations, most recently 04/05/2020.  Currently on sotalol 120 mg twice daily.  CHA2DS2-VASc of 4.  High risk medication monitoring for sotalol today via ECG.  Unfortunately was back in atrial flutter and has had a recent cardioversion.  Since her cardioversion she has done well without further arrhythmia.  We Gilmar Bua continue with current management.  3.  Hypertension: Currently well controlled  4.  Hyperlipidemia: Continue statin per primary cardiology  5.  Secondary hypercoagulable state: Continue Eliquis for atrial fibrillation as above   Current medicines are reviewed at length with the patient today.   The patient does not have concerns regarding her medicines.  The following changes were made today: None  Labs/ tests ordered today include:  No orders of the defined types were placed in this encounter.    Disposition:   FU with Lawerence Dery 6 months  Signed, Dula Havlik Meredith Leeds, MD  11/04/2021 2:16 PM     [address] [place]  [address] [phone] (office) [phone] (fax)

## 2021-12-04 ENCOUNTER — Ambulatory Visit (INDEPENDENT_AMBULATORY_CARE_PROVIDER_SITE_OTHER): Payer: Medicare Other

## 2021-12-04 DIAGNOSIS — I495 Sick sinus syndrome: Secondary | ICD-10-CM

## 2021-12-06 LAB — CUP PACEART REMOTE DEVICE CHECK
Battery Remaining Longevity: 76 mo
Battery Remaining Percentage: 62 %
Battery Voltage: 2.99 V
Brady Statistic AP VP Percent: 55 %
Brady Statistic AP VS Percent: 1.9 %
Brady Statistic AS VP Percent: 22 %
Brady Statistic AS VS Percent: 21 %
Brady Statistic RA Percent Paced: 56 %
Brady Statistic RV Percent Paced: 77 %
Date Time Interrogation Session: 20230621020014
Implantable Lead Implant Date: 20190909
Implantable Lead Implant Date: 20190909
Implantable Lead Location: 753859
Implantable Lead Location: 753860
Implantable Pulse Generator Implant Date: 20190909
Lead Channel Impedance Value: 460 Ohm
Lead Channel Impedance Value: 550 Ohm
Lead Channel Pacing Threshold Amplitude: 1 V
Lead Channel Pacing Threshold Amplitude: 1 V
Lead Channel Pacing Threshold Pulse Width: 0.4 ms
Lead Channel Pacing Threshold Pulse Width: 0.4 ms
Lead Channel Sensing Intrinsic Amplitude: 12 mV
Lead Channel Sensing Intrinsic Amplitude: 2.4 mV
Lead Channel Setting Pacing Amplitude: 2 V
Lead Channel Setting Pacing Amplitude: 2.5 V
Lead Channel Setting Pacing Pulse Width: 0.4 ms
Lead Channel Setting Sensing Sensitivity: 2 mV
Pulse Gen Model: 2272
Pulse Gen Serial Number: 9053085

## 2021-12-16 NOTE — Progress Notes (Signed)
Remote pacemaker transmission.   

## 2021-12-31 ENCOUNTER — Other Ambulatory Visit: Payer: Self-pay | Admitting: Student

## 2021-12-31 DIAGNOSIS — I484 Atypical atrial flutter: Secondary | ICD-10-CM

## 2022-01-02 DIAGNOSIS — K635 Polyp of colon: Secondary | ICD-10-CM | POA: Insufficient documentation

## 2022-01-02 DIAGNOSIS — G2581 Restless legs syndrome: Secondary | ICD-10-CM | POA: Insufficient documentation

## 2022-01-02 DIAGNOSIS — F411 Generalized anxiety disorder: Secondary | ICD-10-CM | POA: Insufficient documentation

## 2022-01-20 ENCOUNTER — Ambulatory Visit: Payer: Medicare Other | Admitting: Cardiology

## 2022-02-20 ENCOUNTER — Ambulatory Visit: Payer: Medicare Other | Admitting: Cardiology

## 2022-03-05 ENCOUNTER — Ambulatory Visit (INDEPENDENT_AMBULATORY_CARE_PROVIDER_SITE_OTHER): Payer: Medicare Other

## 2022-03-05 DIAGNOSIS — I495 Sick sinus syndrome: Secondary | ICD-10-CM

## 2022-03-05 LAB — CUP PACEART REMOTE DEVICE CHECK
Battery Remaining Longevity: 72 mo
Battery Remaining Percentage: 60 %
Battery Voltage: 2.99 V
Brady Statistic AP VP Percent: 62 %
Brady Statistic AP VS Percent: 1.6 %
Brady Statistic AS VP Percent: 19 %
Brady Statistic AS VS Percent: 18 %
Brady Statistic RA Percent Paced: 63 %
Brady Statistic RV Percent Paced: 81 %
Date Time Interrogation Session: 20230920020015
Implantable Lead Implant Date: 20190909
Implantable Lead Implant Date: 20190909
Implantable Lead Location: 753859
Implantable Lead Location: 753860
Implantable Pulse Generator Implant Date: 20190909
Lead Channel Impedance Value: 480 Ohm
Lead Channel Impedance Value: 540 Ohm
Lead Channel Pacing Threshold Amplitude: 1 V
Lead Channel Pacing Threshold Amplitude: 1 V
Lead Channel Pacing Threshold Pulse Width: 0.4 ms
Lead Channel Pacing Threshold Pulse Width: 0.4 ms
Lead Channel Sensing Intrinsic Amplitude: 0.5 mV
Lead Channel Sensing Intrinsic Amplitude: 12 mV
Lead Channel Setting Pacing Amplitude: 2 V
Lead Channel Setting Pacing Amplitude: 2.5 V
Lead Channel Setting Pacing Pulse Width: 0.4 ms
Lead Channel Setting Sensing Sensitivity: 2 mV
Pulse Gen Model: 2272
Pulse Gen Serial Number: 9053085

## 2022-03-18 NOTE — Progress Notes (Signed)
Remote pacemaker transmission.   

## 2022-04-07 DIAGNOSIS — R7303 Prediabetes: Secondary | ICD-10-CM | POA: Insufficient documentation

## 2022-04-08 ENCOUNTER — Other Ambulatory Visit: Payer: Self-pay | Admitting: Cardiology

## 2022-04-10 DIAGNOSIS — N1832 Chronic kidney disease, stage 3b: Secondary | ICD-10-CM | POA: Insufficient documentation

## 2022-04-17 ENCOUNTER — Encounter: Payer: Self-pay | Admitting: Cardiology

## 2022-04-21 ENCOUNTER — Other Ambulatory Visit: Payer: Self-pay | Admitting: *Deleted

## 2022-04-21 DIAGNOSIS — I484 Atypical atrial flutter: Secondary | ICD-10-CM

## 2022-04-21 MED ORDER — SOTALOL HCL 120 MG PO TABS: 120.0000 mg | ORAL_TABLET | Freq: Two times a day (BID) | ORAL | 2 refills | Status: DC

## 2022-04-21 MED ORDER — METOPROLOL SUCCINATE ER 50 MG PO TB24: ORAL_TABLET | ORAL | 3 refills | Status: DC

## 2022-04-22 ENCOUNTER — Encounter: Payer: Self-pay | Admitting: Cardiology

## 2022-05-21 ENCOUNTER — Ambulatory Visit: Payer: Medicare Other | Admitting: Cardiology

## 2022-05-28 ENCOUNTER — Encounter: Payer: Self-pay | Admitting: Cardiology

## 2022-06-02 ENCOUNTER — Encounter: Payer: Self-pay | Admitting: Cardiology

## 2022-06-02 ENCOUNTER — Ambulatory Visit: Payer: Medicare Other | Attending: Cardiology | Admitting: Cardiology

## 2022-06-02 VITALS — BP 198/98 | HR 54 | Ht 66.0 in | Wt 167.0 lb

## 2022-06-02 DIAGNOSIS — I495 Sick sinus syndrome: Secondary | ICD-10-CM | POA: Diagnosis present

## 2022-06-02 DIAGNOSIS — D6869 Other thrombophilia: Secondary | ICD-10-CM | POA: Insufficient documentation

## 2022-06-02 DIAGNOSIS — I10 Essential (primary) hypertension: Secondary | ICD-10-CM | POA: Insufficient documentation

## 2022-06-02 DIAGNOSIS — I4819 Other persistent atrial fibrillation: Secondary | ICD-10-CM | POA: Insufficient documentation

## 2022-06-02 DIAGNOSIS — Z79899 Other long term (current) drug therapy: Secondary | ICD-10-CM | POA: Diagnosis not present

## 2022-06-02 LAB — CUP PACEART INCLINIC DEVICE CHECK
Battery Remaining Longevity: 67 mo
Battery Voltage: 2.99 V
Brady Statistic RA Percent Paced: 61 %
Brady Statistic RV Percent Paced: 80 %
Date Time Interrogation Session: 20231218145812
Implantable Lead Connection Status: 753985
Implantable Lead Connection Status: 753985
Implantable Lead Implant Date: 20190909
Implantable Lead Implant Date: 20190909
Implantable Lead Location: 753859
Implantable Lead Location: 753860
Implantable Pulse Generator Implant Date: 20190909
Lead Channel Impedance Value: 475 Ohm
Lead Channel Impedance Value: 537.5 Ohm
Lead Channel Pacing Threshold Amplitude: 1 V
Lead Channel Pacing Threshold Amplitude: 1 V
Lead Channel Pacing Threshold Amplitude: 1 V
Lead Channel Pacing Threshold Amplitude: 1 V
Lead Channel Pacing Threshold Pulse Width: 0.4 ms
Lead Channel Pacing Threshold Pulse Width: 0.4 ms
Lead Channel Pacing Threshold Pulse Width: 0.4 ms
Lead Channel Pacing Threshold Pulse Width: 0.4 ms
Lead Channel Sensing Intrinsic Amplitude: 12.5 mV
Lead Channel Sensing Intrinsic Amplitude: 2.3 mV
Lead Channel Setting Pacing Amplitude: 2 V
Lead Channel Setting Pacing Amplitude: 2.5 V
Lead Channel Setting Pacing Pulse Width: 0.4 ms
Lead Channel Setting Sensing Sensitivity: 2 mV
Pulse Gen Model: 2272
Pulse Gen Serial Number: 9053085

## 2022-06-02 MED ORDER — AMLODIPINE BESYLATE 5 MG PO TABS: 5.0000 mg | ORAL_TABLET | Freq: Every day | ORAL | 6 refills | Status: DC

## 2022-06-02 MED ORDER — IRBESARTAN 300 MG PO TABS: 300.0000 mg | ORAL_TABLET | Freq: Every day | ORAL | 6 refills | Status: DC

## 2022-06-02 NOTE — Patient Instructions (Signed)
Medication Instructions:  Your physician has recommended you make the following change in your medication:  STOP Losartan START Avapro (Irbesartan) 300 mg once daily START Norvasc (Amlodipine) 5 mg once daily  *If you need a refill on your cardiac medications before your next appointment, please call your pharmacy*   Lab Work: None ordered   Testing/Procedures: None ordered   Follow-Up: At [facility], you and your health needs are our priority.  As part of our continuing mission to provide you with exceptional heart care, we have created designated Provider Care Teams.  These Care Teams include your primary Cardiologist (physician) and Advanced Practice Providers (APPs -  Physician Assistants and Nurse Practitioners) who all work together to provide you with the care you need, when you need it.  We recommend signing up for the patient portal called "MyChart".  Sign up information is provided on this After Visit Summary.  MyChart is used to connect with patients for Virtual Visits (Telemedicine).  Patients are able to view lab/test results, encounter notes, upcoming appointments, etc.  Non-urgent messages can be sent to your provider as well.   To learn more about what you can do with MyChart, go to [email or web address].    Remote monitoring is used to monitor your Pacemaker or ICD from home. This monitoring reduces the number of office visits required to check your device to one time per year. It allows [place] to keep an eye on the functioning of your device to ensure it is working properly. You are scheduled for a device check from home on 06/05/2022, 09/04/2022. You may send your transmission at any time that day. If you have a wireless device, the transmission will be sent automatically. After your physician reviews your transmission, you will receive a postcard with your next transmission date.  Your next appointment:   6 month(s)  The format for your next appointment:   In  Person  Provider:   Loman Brooklyn, MD    Thank you for choosing [facility]!!   Dory Horn, RN [phone]    Other Instructions   Irbesartan Tablets What is this medication? IRBESARTAN (ir be SAR tan) treats high blood pressure. It works by relaxing blood vessels, which decreases the amount of work the heart has to do. It may also be used to prevent kidney damage in people with diabetes. It belongs to a group of medications called ARBs. This medicine may be used for other purposes; ask your health care provider or pharmacist if you have questions. COMMON BRAND NAME(S): Avapro What should I tell my care team before I take this medication? They need to know if you have any of these conditions: Heart failure Kidney or liver disease An unusual or allergic reaction to irbesartan, other medications, foods, dyes, or preservatives Pregnant or trying to get pregnant Breast-feeding How should I use this medication? Take this medication by mouth. Take it as directed on the prescription label at the same time every day. You can take it with or without food. If it upsets your stomach, take it with food. Keep taking it unless your care team tells you to stop. Talk to your care team about the use of this medication in children. Special care may be needed. Overdosage: If you think you have taken too much of this medicine contact a poison control center or emergency room at once. NOTE: This medicine is only for you. Do not share this medicine with others. What if I miss a dose? If you miss  a dose, take it as soon as you can. If it is almost time for your next dose, take only that dose. Do not take double or extra doses. What may interact with this medication? This medication may interact with the following: Diuretics, especially triamterene, spironolactone, or amiloride Potassium salts or potassium supplements This list may not describe all possible interactions. Give your health care  provider a list of all the medicines, herbs, non-prescription drugs, or dietary supplements you use. Also tell them if you smoke, drink alcohol, or use illegal drugs. Some items may interact with your medicine. What should I watch for while using this medication? Visit your care team for regular checks on your progress. Check your blood pressure as directed. Ask your care team what your blood pressure should be and when you should contact them. Call your care team if you notice an irregular or fast heart beat. Women should inform their care team if they wish to become pregnant or think they might be pregnant. There is a potential for serious side effects to an unborn child, particularly in the second or third trimester. Talk to your care team or pharmacist for more information. You may get drowsy or dizzy. Do not drive, use machinery, or do anything that needs mental alertness until you know how this medication affects you. Do not stand or sit up quickly, especially if you are an older patient. This reduces the risk of dizzy or fainting spells. Alcohol can make you more drowsy and dizzy. Avoid alcoholic drinks. Avoid salt substitutes unless you are told otherwise by your care team. Do not treat yourself for coughs, colds, or pain while you are taking this medication without asking your care team for advice. Some ingredients may increase your blood pressure. What side effects may I notice from receiving this medication? Side effects that you should report to your care team as soon as possible: Allergic reactions--skin rash, itching, hives, swelling of the face, lips, tongue, or throat High potassium level--muscle weakness, fast or irregular heartbeat Kidney injury--decrease in the amount of urine, swelling of the ankles, hands, or feet Low blood pressure--dizziness, feeling faint or lightheaded, blurry vision Side effects that usually do not require medical attention (report to your care team if they  continue or are bothersome): Diarrhea Dizziness Fatigue Upset stomach This list may not describe all possible side effects. Call your doctor for medical advice about side effects. You may report side effects to FDA at 1-800-FDA-1088. Where should I keep my medication? Keep out of the reach of children and pets. Store at room temperature between 15 and 30 degrees C (59 and 86 degrees F). Throw away any unused medication after the expiration date. NOTE: This sheet is a summary. It may not cover all possible information. If you have questions about this medicine, talk to your doctor, pharmacist, or health care provider.  2023 Elsevier/Gold Standard (2007-07-24 00:00:00)   Amlodipine Tablets What is this medication? AMLODIPINE (am LOE di peen) treats high blood pressure and prevents chest pain (angina). It works by relaxing the blood vessels, which helps decrease the amount of work your heart has to do. It belongs to a group of medications called calcium channel blockers. This medicine may be used for other purposes; ask your health care provider or pharmacist if you have questions. COMMON BRAND NAME(S): Norvasc What should I tell my care team before I take this medication? They need to know if you have any of these conditions: Heart disease Liver disease An  unusual or allergic reaction to amlodipine, other medications, foods, dyes, or preservatives Pregnant or trying to get pregnant Breastfeeding How should I use this medication? Take this medication by mouth. Take it as directed on the prescription label at the same time every day. You can take it with or without food. If it upsets your stomach, take it with food. Keep taking it unless your care team tells you to stop. Talk to your care team about the use of this medication in children. While it may be prescribed for children as young as 6 for selected conditions, precautions do apply. Overdosage: If you think you have taken too much of  this medicine contact a poison control center or emergency room at once. NOTE: This medicine is only for you. Do not share this medicine with others. What if I miss a dose? If you miss a dose, take it as soon as you can. If it is almost time for your next dose, take only that dose. Do not take double or extra doses. What may interact with this medication? Clarithromycin Cyclosporine Diltiazem Itraconazole Simvastatin Tacrolimus This list may not describe all possible interactions. Give your health care provider a list of all the medicines, herbs, non-prescription drugs, or dietary supplements you use. Also tell them if you smoke, drink alcohol, or use illegal drugs. Some items may interact with your medicine. What should I watch for while using this medication? Visit your care team for regular checks on your progress. Check your blood pressure as directed. Know what your blood pressure should be and when to contact your care team. Do not treat yourself for coughs, colds, or pain while you are using this medication without asking your care team for advice. Some medications may increase your blood pressure. This medication may affect your coordination, reaction time, or judgment. Do not drive or operate machinery until you know how this medication affects you. Sit up or stand slowly to reduce the risk of dizzy or fainting spells. Drinking alcohol with this medication can increase the risk of these side effects. What side effects may I notice from receiving this medication? Side effects that you should report to your care team as soon as possible: Allergic reactions--skin rash, itching, hives, swelling of the face, lips, tongue, or throat Heart attack--pain or tightness in the chest, shoulders, arms, or jaw, nausea, shortness of breath, cold or clammy skin, feeling faint or lightheaded Low blood pressure--dizziness, feeling faint or lightheaded, blurry vision Worsening chest pain (angina)--pain,  pressure, or tightness in the chest, neck, back, or arms Side effects that usually do not require medical attention (report these to your care team if they continue or are bothersome): Facial flushing, redness Heart palpitations--rapid, pounding, or irregular heartbeat Nausea Stomach pain Swelling of the ankles, hands, or feet This list may not describe all possible side effects. Call your doctor for medical advice about side effects. You may report side effects to FDA at 1-800-FDA-1088. Where should I keep my medication? Keep out of the reach of children and pets. Store at room temperature between 20 and 25 degrees C (68 and 77 degrees F). Protect from light and moisture. Keep the container tightly closed. Get rid of any unused medication after the expiration date. To get rid of medications that are no longer needed or have expired: Take the medication to a medication take-back program. Check with your pharmacy or law enforcement to find a location. If you cannot return the medication, check the label or package insert to  see if the medication should be thrown out in the garbage or flushed down the toilet. If you are not sure, ask your care team. If it is safe to put in the trash, empty the medication out of the container. Mix the medication with cat litter, dirt, coffee grounds, or other unwanted substance. Seal the mixture in a bag or container. Put it in the trash. NOTE: This sheet is a summary. It may not cover all possible information. If you have questions about this medicine, talk to your doctor, pharmacist, or health care provider.  2023 Elsevier/Gold Standard (2021-12-23 00:00:00)      Important Information About Sugar

## 2022-06-02 NOTE — Progress Notes (Signed)
Electrophysiology Office Note   Date:  06/02/2022   ID:  Diane Valentine, DOB March 13, 1951, MRN [id number]  PCP:  Woodroe Chen, MD  Cardiologist:  Dulce Sellar Primary Electrophysiologist:  [place] Oborn Jorja Loa, MD    No chief complaint on file.     History of Present Illness: Diane Valentine is a 71 y.o. female who is being seen today for the evaluation of pacemaker at the request of Woodroe Chen, MD. Presenting today for electrophysiology evaluation.  She has a history significant for paroxysmal atrial flutter, sick sinus syndrome, coronary artery disease post CABG, hypertension, renal artery stenosis, hyperlipidemia.  She is post Printmaker.  She had cryoablation in 2019 with RF ablations 03/22/2019 and 04/05/2020.  Today, denies symptoms of palpitations, chest pain, shortness of breath, orthopnea, PND, lower extremity edema, claudication, dizziness, presyncope, syncope, bleeding, or neurologic sequela. The patient is tolerating medications without difficulties.  Since being seen she has done well.  She has had 1 episode of atrial fibrillation but otherwise has been controlled.  She does state that her blood pressure has been significantly elevated at home, in the 160s to 200s.    Past Medical History:  Diagnosis Date   CAD in native artery 01/02/2019   Carotid stenosis, bilateral 01/02/2019   Essential hypertension 01/02/2019   History of amiodarone therapy 01/02/2019   Hx of CABG 01/02/2019   Hyperlipidemia 01/02/2019   PAF (paroxysmal atrial fibrillation) (HCC) 01/02/2019   Presence of permanent cardiac pacemaker    Renal artery stenosis (HCC) 01/02/2019   Sick sinus syndrome (HCC) 01/02/2019   Past Surgical History:  Procedure Laterality Date   ATRIAL FIBRILLATION ABLATION N/A 03/22/2019   Procedure: ATRIAL FIBRILLATION ABLATION;  Surgeon: Regan Lemming, MD;  Location: [facility] INVASIVE CV LAB;  Service: Cardiovascular;  Laterality: N/A;   ATRIAL  FIBRILLATION ABLATION N/A 04/04/2020   Procedure: ATRIAL FIBRILLATION ABLATION;  Surgeon: Regan Lemming, MD;  Location: [facility] INVASIVE CV LAB;  Service: Cardiovascular;  Laterality: N/A;   BOWEL RESECTION     CARDIOVERSION     CARDIOVERSION N/A 07/09/2021   Procedure: CARDIOVERSION (CATH LAB);  Surgeon: Regan Lemming, MD;  Location: [facility] INVASIVE CV LAB;  Service: Cardiovascular;  Laterality: N/A;   CORONARY ANGIOPLASTY WITH STENT PLACEMENT     CORONARY ARTERY BYPASS GRAFT  2016   x 3 vessels   CRYOABLATION     INSERT / REPLACE / REMOVE PACEMAKER       Current Outpatient Medications  Medication Sig Dispense Refill   acetaminophen (TYLENOL) 500 MG tablet Take 1,000 mg by mouth every 6 (six) hours as needed for moderate pain or headache.     albuterol (VENTOLIN HFA) 108 (90 Base) MCG/ACT inhaler Inhale 2 puffs into the lungs every 6 (six) hours as needed for wheezing or shortness of breath.      allopurinol (ZYLOPRIM) 100 MG tablet Take 100 mg by mouth daily.     aspirin EC 81 MG tablet Take 81 mg by mouth daily.      atorvastatin (LIPITOR) 40 MG tablet Take 40 mg by mouth every evening.     Biotin 76811 MCG TBDP Take 10,000 mcg by mouth at bedtime.     cloNIDine (CATAPRES) 0.1 MG tablet Take 0.1 mg by mouth 2 (two) times daily.     ELIQUIS 5 MG TABS tablet TAKE 1 TABLET BY MOUTH TWICE A DAY 180 tablet 1   furosemide (LASIX) 40 MG tablet Take 1/2 tablet by mouth twice daily  levothyroxine (SYNTHROID) 75 MCG tablet Take 75 mcg by mouth daily before breakfast.      losartan (COZAAR) 100 MG tablet Take 100 mg by mouth at bedtime.     metoprolol succinate (TOPROL-XL) 50 MG 24 hr tablet TAKE 1 TABLET BY MOUTH EVERYDAY AT BEDTIME 90 tablet 3   nitroGLYCERIN (NITROSTAT) 0.4 MG SL tablet Place 0.4 mg under the tongue every 5 (five) minutes x 3 doses as needed for chest pain.      omeprazole (PRILOSEC) 20 MG capsule Take 20 mg by mouth 2 (two) times daily.     Polyethyl Glycol-Propyl  Glycol (SYSTANE) 0.4-0.3 % SOLN Place 1 drop into both eyes daily as needed (Dry eye).     potassium chloride (MICRO-K) 10 MEQ CR capsule Take 10 mEq by mouth every other day. In the morning     pramipexole (MIRAPEX) 0.5 MG tablet Take 0.5 mg by mouth at bedtime.     saline (AYR) GEL Place 1 application into the nose daily as needed (dryness).     sotalol (BETAPACE) 120 MG tablet Take 1 tablet (120 mg total) by mouth 2 (two) times daily. 180 tablet 2   traZODone (DESYREL) 50 MG tablet Take 50 mg by mouth at bedtime as needed for sleep.     vitamin B-12 (CYANOCOBALAMIN) 1000 MCG tablet Take 1,000 mcg by mouth daily.     No current facility-administered medications for this visit.    Allergies:   Hydrocodone bit-homatrop mbr, Tizanidine, Ace inhibitors, and Rofecoxib   Social History:  The patient  reports that she quit smoking about 14 years ago. Her smoking use included cigarettes. She has a 33.75 pack-year smoking history. She has never used smokeless tobacco. She reports current alcohol use of about 2.0 standard drinks of alcohol per week. She reports that she does not use drugs.   Family History:  The patient's family history includes Heart attack (age of onset: 54) in her father; Hypertension in her daughter, mother, sister, sister, and son.   ROS:  Please see the history of present illness.   Otherwise, review of systems is positive for none.   All other systems are reviewed and negative.   PHYSICAL EXAM: VS:  BP (!) 172/90   Pulse (!) 54   Ht 5\' 6"  (1.676 m)   Wt 167 lb (75.8 kg)   SpO2 93%   BMI 26.95 kg/m  , BMI Body mass index is 26.95 kg/m. GEN: Well nourished, well developed, in no acute distress  HEENT: normal  Neck: no JVD, carotid bruits, or masses Cardiac: RRR; no murmurs, rubs, or gallops,no edema  Respiratory:  clear to auscultation bilaterally, normal work of breathing GI: soft, nontender, nondistended, + BS MS: no deformity or atrophy  Skin: warm and dry, device  site well healed Neuro:  Strength and sensation are intact Psych: euthymic mood, full affect  EKG:  EKG is ordered today. Personal review of the ekg ordered shows atrial sensed, intermittent ventricular paced, rate 54  Personal review of the device interrogation today. Results in Paceart   Recent Labs: 08/28/2021: BUN 43; Creatinine, Ser 1.63; Hemoglobin 13.0; Platelets 149; Potassium 4.5; Sodium 132; TSH 4.990    Lipid Panel     Component Value Date/Time   CHOL 167 01/03/2019 1713   TRIG 133 01/03/2019 1713   HDL 61 01/03/2019 1713   CHOLHDL 2.7 01/03/2019 1713   LDLCALC 79 01/03/2019 1713     Wt Readings from Last 3 Encounters:  06/02/22 167 lb (  75.8 kg)  11/04/21 181 lb (82.1 kg)  08/28/21 196 lb 3.2 oz (89 kg)      Other studies Reviewed: Additional studies/ records that were reviewed today include: TTE 09/18/21 Review of the above records today demonstrates:   1. Left ventricular ejection fraction, by estimation, is 60 to 65%. The  left ventricle has normal function. The left ventricle has no regional  wall motion abnormalities. There is mild left ventricular hypertrophy.  Left ventricular diastolic parameters  are consistent with Grade II diastolic dysfunction (pseudonormalization).   2. Right ventricular systolic function is normal. The right ventricular  size is normal. There is mildly elevated pulmonary artery systolic  pressure.   3. Left atrial size was severely dilated.   4. Right atrial size was moderately dilated.   5. The mitral valve is normal in structure. Mild mitral valve  regurgitation. No evidence of mitral stenosis.   6. The aortic valve is normal in structure. Aortic valve regurgitation is  not visualized. Aortic valve sclerosis/calcification is present, without  any evidence of aortic stenosis.   7. The inferior vena cava is normal in size with greater than 50%  respiratory variability, suggesting right atrial pressure of 3 mmHg.   ASSESSMENT  AND PLAN:  1.  Sick sinus syndrome: Status post Saint Jude dual-chamber pacemaker.  Device functioning appropriately.  No changes.  2.  Paroxysmal atrial fibrillation: Status post cryoablation with 2 subsequent RF ablations, most recently 04/05/2020.  Currently on sotalol 120 mg daily.  CHA2DS2-VASc of 4.  1 more episode of atrial fibrillation though short-lived.  No changes.  3.  Hypertension: Elevated today.  Yoon Barca stop losartan and start Avapro 300 mg daily, Norvasc 5 mg daily.  She Warrick Llera follow-up with her primary physician for further changes.  4.  Hyperlipidemia: Continue statin per primary cardiology  5.  Secondary hypercoagulable state: Currently on Eliquis for atrial fibrillation as above  6.  High risk medication monitoring: Currently on sotalol as above.  QTc is remained stable.  Recent potassium magnesium within normal limits.   Current medicines are reviewed at length with the patient today.   The patient does not have concerns regarding her medicines.  The following changes were made today: Stop losartan, start Avapro, Norvasc  Labs/ tests ordered today include:  Orders Placed This Encounter  Procedures   CUP PACEART INCLINIC DEVICE CHECK   EKG 12-Lead     Disposition:   FU 6 months  Signed, Hildreth Robart Jorja Loa, MD  06/02/2022 3:04 PM     [facility] [address] [place] [address] [phone] (office) [phone] (fax)

## 2022-06-05 ENCOUNTER — Ambulatory Visit (INDEPENDENT_AMBULATORY_CARE_PROVIDER_SITE_OTHER): Payer: Medicare Other

## 2022-06-05 DIAGNOSIS — I495 Sick sinus syndrome: Secondary | ICD-10-CM | POA: Diagnosis not present

## 2022-06-05 LAB — CUP PACEART REMOTE DEVICE CHECK
Battery Remaining Longevity: 70 mo
Battery Remaining Percentage: 57 %
Battery Voltage: 2.99 V
Brady Statistic AP VP Percent: 56 %
Brady Statistic AP VS Percent: 1.7 %
Brady Statistic AS VP Percent: 19 %
Brady Statistic AS VS Percent: 23 %
Brady Statistic RA Percent Paced: 58 %
Brady Statistic RV Percent Paced: 75 %
Date Time Interrogation Session: 20231221020016
Implantable Lead Connection Status: 753985
Implantable Lead Connection Status: 753985
Implantable Lead Implant Date: 20190909
Implantable Lead Implant Date: 20190909
Implantable Lead Location: 753859
Implantable Lead Location: 753860
Implantable Pulse Generator Implant Date: 20190909
Lead Channel Impedance Value: 460 Ohm
Lead Channel Impedance Value: 540 Ohm
Lead Channel Pacing Threshold Amplitude: 1 V
Lead Channel Pacing Threshold Amplitude: 1 V
Lead Channel Pacing Threshold Pulse Width: 0.4 ms
Lead Channel Pacing Threshold Pulse Width: 0.4 ms
Lead Channel Sensing Intrinsic Amplitude: 12 mV
Lead Channel Sensing Intrinsic Amplitude: 2.2 mV
Lead Channel Setting Pacing Amplitude: 2 V
Lead Channel Setting Pacing Amplitude: 2.5 V
Lead Channel Setting Pacing Pulse Width: 0.4 ms
Lead Channel Setting Sensing Sensitivity: 2 mV
Pulse Gen Model: 2272
Pulse Gen Serial Number: 9053085

## 2022-06-17 ENCOUNTER — Telehealth: Payer: Self-pay | Admitting: Cardiology

## 2022-06-17 NOTE — Telephone Encounter (Signed)
Patient would like to talk to Dr. Curt Bears or nurse in regards to the medication change. Please call back

## 2022-06-17 NOTE — Telephone Encounter (Signed)
Pt reports pedal/ankle edema, can't even get her shoes on. Advised to stop Norvasc. Reports BPs avg 135/60-70s since med changes. Aware forwarding to MD for review/advisement and will call her once he advises. Informed to continue Irbesartan. Patient verbalized understanding and agreeable to plan.

## 2022-06-18 ENCOUNTER — Ambulatory Visit: Payer: 59 | Admitting: Cardiology

## 2022-06-18 NOTE — Telephone Encounter (Signed)
Pt advised to stop Norvasc per Dr. Curt Bears. Advised to monitor BPs and if continue to remain elevated to contact PCP for further advisement. Patient verbalized understanding and agreeable to plan.

## 2022-06-27 NOTE — Progress Notes (Signed)
Remote pacemaker transmission.   

## 2022-06-30 ENCOUNTER — Other Ambulatory Visit: Payer: Self-pay | Admitting: Cardiology

## 2022-06-30 DIAGNOSIS — I484 Atypical atrial flutter: Secondary | ICD-10-CM

## 2022-07-25 ENCOUNTER — Ambulatory Visit: Payer: Medicare HMO | Attending: Cardiology | Admitting: Cardiology

## 2022-07-25 ENCOUNTER — Encounter: Payer: Self-pay | Admitting: Cardiology

## 2022-07-25 VITALS — BP 142/70 | HR 53 | Ht 64.0 in | Wt 157.0 lb

## 2022-07-25 DIAGNOSIS — E785 Hyperlipidemia, unspecified: Secondary | ICD-10-CM

## 2022-07-25 DIAGNOSIS — Z951 Presence of aortocoronary bypass graft: Secondary | ICD-10-CM | POA: Diagnosis not present

## 2022-07-25 DIAGNOSIS — R0989 Other specified symptoms and signs involving the circulatory and respiratory systems: Secondary | ICD-10-CM

## 2022-07-25 DIAGNOSIS — I2510 Atherosclerotic heart disease of native coronary artery without angina pectoris: Secondary | ICD-10-CM | POA: Diagnosis not present

## 2022-07-25 DIAGNOSIS — I739 Peripheral vascular disease, unspecified: Secondary | ICD-10-CM | POA: Diagnosis not present

## 2022-07-25 DIAGNOSIS — I480 Paroxysmal atrial fibrillation: Secondary | ICD-10-CM | POA: Diagnosis not present

## 2022-07-25 MED ORDER — ALLOPURINOL 100 MG PO TABS: 50.0000 mg | ORAL_TABLET | Freq: Every day | ORAL | 0 refills | Status: DC

## 2022-07-25 MED ORDER — ALLOPURINOL 100 MG PO TABS: 100.0000 mg | ORAL_TABLET | Freq: Every day | ORAL | 0 refills | Status: DC

## 2022-07-25 NOTE — Patient Instructions (Addendum)
Medication Instructions:  Your physician recommends that you continue on your current medications as directed. Please refer to the Current Medication list given to you today.  *If you need a refill on your cardiac medications before your next appointment, please call your pharmacy*   Lab Work: [facility] recommends that you return for lab work in:  when fasting  You need to have labs done when you are fasting.  You can come Monday through Friday 8:00 am to 12:00 pm and 1:00 to 4:00. You do not need to make an appointment as the order has already been placed. Fasting Lipid, AST, ALT, BMP   Testing/Procedures: Your physician has requested that you have a carotid duplex. This test is an ultrasound of the carotid arteries in your neck. It looks at blood flow through these arteries that supply the brain with blood. Allow one hour for this exam. There are no restrictions or special instructions.    Follow-Up: At [facility], you and your health needs are our priority.  As part of our continuing mission to provide you with exceptional heart care, we have created designated Provider Care Teams.  These Care Teams include your primary Cardiologist (physician) and Advanced Practice Providers (APPs -  Physician Assistants and Nurse Practitioners) who all work together to provide you with the care you need, when you need it.  We recommend signing up for the patient portal called "MyChart".  Sign up information is provided on this After Visit Summary.  MyChart is used to connect with patients for Virtual Visits (Telemedicine).  Patients are able to view lab/test results, encounter notes, upcoming appointments, etc.  Non-urgent messages can be sent to your provider as well.   To learn more about what you can do with MyChart, go to [email or web address].    Your next appointment:   6 month(s)  The format for your next appointment:   In Person  Provider:   Jenne Campus, MD     Other Instructions This visit was accompanied by Truddie Hidden, RN.

## 2022-07-25 NOTE — Progress Notes (Unsigned)
This visit was accompanied by Truddie Hidden, RN.

## 2022-07-25 NOTE — Progress Notes (Unsigned)
Cardiology Office Note:    Date:  07/25/2022   ID:  Estill Bamberg, DOB 1951/04/14, MRN [id number]  PCP:  Marjory Sneddon, MD  Cardiologist:  Jenne Campus, MD    Referring MD: Charleston Poot, MD   Chief Complaint  Patient presents with   Follow-up    History of Present Illness:    Diane Valentine is a 72 y.o. female with past medical history significant for coronary artery disease, status post coronary artery bypass graft years ago, status post both side carotic endarterectomy, essential hypertension, dyslipidemia, paroxysmal atrial fibrillation status post cryoablation and then 2 additional ablations done total of 3, she is now suppressed with sotalol, she is also anticoagulated.  She is in my office today to be established as a patient.  Overall she is doing very well.  She denies of any chest pain tightness squeezing pressure burning chest.  She have a dog by name Junior that she is trying to walk with some but admits that she is not too active.  Past Medical History:  Diagnosis Date   Acute leg pain, left 09/23/2016   Acute on chronic renal insufficiency    AKI (acute kidney injury) ([place])    Alcohol abuse 02/18/2015   CAD in native artery 01/02/2019   Carotid stenosis, bilateral 01/02/2019   Essential hypertension 01/02/2019   Generalized anxiety disorder 01/02/2022   History of amiodarone therapy 01/02/2019   History of colon polyps 08/17/2015   History of MI (myocardial infarction) 08/16/2015   Hx of CABG 01/02/2019   Hypercalcemia 11/22/2016   Hyperglycemia 11/22/2016   Hyperlipidemia 01/02/2019   Obesity (BMI 30-39.9) 11/20/2020   Pacemaker reprogramming/check 03/04/2018   PAF (paroxysmal atrial fibrillation) ([place]) 01/02/2019   Personal history of cardiovascular disorder 08/20/2017   Posterior vitreous detachment of right eye 09/17/2016   Prediabetes 04/07/2022   Presence of permanent cardiac pacemaker    Pseudophakia of both eyes 02/02/2019   Renal artery stenosis (HCC) 01/02/2019    Restless leg syndrome 01/02/2022   S/P ablation of atrial fibrillation 08/20/2017   Secondary hypercoagulable state ([place]) 04/19/2019   Shoulder lesion, unspecified, right shoulder 04/24/2021   Sick sinus syndrome ([place]) 01/02/2019   Stage 3b chronic kidney disease ([place])    Thumb pain, left 04/20/2014   Vitreous floaters of right eye 09/17/2016    Past Surgical History:  Procedure Laterality Date   ATRIAL FIBRILLATION ABLATION N/A 03/22/2019   Procedure: ATRIAL FIBRILLATION ABLATION;  Surgeon: Constance Haw, MD;  Location: [place] [facility];  Service: Cardiovascular;  Laterality: N/A;   ATRIAL FIBRILLATION ABLATION N/A 04/04/2020   Procedure: ATRIAL FIBRILLATION ABLATION;  Surgeon: Constance Haw, MD;  Location: [place] [facility];  Service: Cardiovascular;  Laterality: N/A;   BOWEL RESECTION     CARDIOVERSION     CARDIOVERSION N/A 07/09/2021   Procedure: CARDIOVERSION (CATH LAB);  Surgeon: Constance Haw, MD;  Location: [place] [facility];  Service: Cardiovascular;  Laterality: N/A;   CORONARY ANGIOPLASTY WITH STENT PLACEMENT     CORONARY ARTERY BYPASS GRAFT  2016   x 3 vessels   CRYOABLATION     INSERT / REPLACE / REMOVE PACEMAKER      Current Medications: Current Meds  Medication Sig   acetaminophen (TYLENOL) 500 MG tablet Take 1,000 mg by mouth every 6 (six) hours as needed for moderate pain or headache.   albuterol (VENTOLIN HFA) 108 (90 Base) MCG/ACT inhaler Inhale 2 puffs into the lungs every 6 (six) hours as needed for  wheezing or shortness of breath.    allopurinol (ZYLOPRIM) 100 MG tablet Take 100 mg by mouth daily.   aspirin EC 81 MG tablet Take 81 mg by mouth daily.    atorvastatin (LIPITOR) 40 MG tablet Take 40 mg by mouth every evening.   Biotin 10000 MCG TBDP Take 10,000 mcg by mouth at bedtime.   cloNIDine (CATAPRES) 0.1 MG tablet Take 0.1 mg by mouth 2 (two) times daily.   ELIQUIS 5 MG TABS tablet TAKE 1 TABLET BY MOUTH TWICE A DAY (Patient taking  differently: Take 5 mg by mouth 2 (two) times daily.)   furosemide (LASIX) 40 MG tablet Take 20 mg by mouth 2 (two) times daily. Take 1/2 tablet by mouth twice daily   irbesartan (AVAPRO) 300 MG tablet Take 1 tablet (300 mg total) by mouth daily.   levothyroxine (SYNTHROID) 75 MCG tablet Take 75 mcg by mouth daily before breakfast.    metoprolol succinate (TOPROL-XL) 50 MG 24 hr tablet TAKE 1 TABLET BY MOUTH EVERYDAY AT BEDTIME (Patient taking differently: Take 50 mg by mouth daily. TAKE 1 TABLET BY MOUTH EVERYDAY AT BEDTIME)   nitroGLYCERIN (NITROSTAT) 0.4 MG SL tablet Place 0.4 mg under the tongue every 5 (five) minutes x 3 doses as needed for chest pain.    omeprazole (PRILOSEC) 20 MG capsule Take 20 mg by mouth 2 (two) times daily.   Polyethyl Glycol-Propyl Glycol (SYSTANE) 0.4-0.3 % SOLN Place 1 drop into both eyes daily as needed (Dry eye).   potassium chloride (MICRO-K) 10 MEQ CR capsule Take 10 mEq by mouth every other day. In the morning   pramipexole (MIRAPEX) 0.5 MG tablet Take 0.5 mg by mouth at bedtime.   saline (AYR) GEL Place 1 application into the nose daily as needed (dryness).   sotalol (BETAPACE) 120 MG tablet Take 1 tablet (120 mg total) by mouth 2 (two) times daily.   traZODone (DESYREL) 50 MG tablet Take 50 mg by mouth at bedtime as needed for sleep.   vitamin B-12 (CYANOCOBALAMIN) 1000 MCG tablet Take 1,000 mcg by mouth daily.     Allergies:   Amlodipine, Hydrocodone bit-homatrop mbr, Tizanidine, Ace inhibitors, and Rofecoxib   Social History   Socioeconomic History   Marital status: Married    Spouse name: Not on file   Number of children: Not on file   Years of education: Not on file   Highest education level: Not on file  Occupational History   Not on file  Tobacco Use   Smoking status: Former    Packs/day: 0.75    Years: 45.00    Total pack years: 33.75    Types: Cigarettes    Quit date: 2009    Years since quitting: 15.1   Smokeless tobacco: Never    Tobacco comments:    Former smoker 02/07/21  Vaping Use   Vaping Use: Never used  Substance and Sexual Activity   Alcohol use: Yes    Alcohol/week: 2.0 standard drinks of alcohol    Types: 2 Cans of beer per week    Comment: 2 beers daily   Drug use: Never   Sexual activity: Not on file  Other Topics Concern   Not on file  Social History Narrative   Not on file   Social Determinants of Health   Financial Resource Strain: Not on file  Food Insecurity: Not on file  Transportation Needs: Not on file  Physical Activity: Not on file  Stress: Not on file  Social Connections:  Not on file     Family History: The patient's family history includes Heart attack (age of onset: 55) in her father; Hypertension in her daughter, mother, sister, sister, and son. ROS:   Please see the history of present illness.    All 14 point review of systems negative except as described per history of present illness  EKGs/Labs/Other Studies Reviewed:      Recent Labs: 08/28/2021: BUN 43; Creatinine, Ser 1.63; Hemoglobin 13.0; Platelets 149; Potassium 4.5; Sodium 132; TSH 4.990  Recent Lipid Panel    Component Value Date/Time   CHOL 167 01/03/2019 1713   TRIG 133 01/03/2019 1713   HDL 61 01/03/2019 1713   CHOLHDL 2.7 01/03/2019 1713   LDLCALC 79 01/03/2019 1713    Physical Exam:    VS:  BP (!) 142/70 (BP Location: Left Arm, Patient Position: Sitting)   Pulse (!) 53   Ht 5' 4"$  (1.626 m)   Wt 157 lb (71.2 kg)   SpO2 92%   BMI 26.95 kg/m     Wt Readings from Last 3 Encounters:  07/25/22 157 lb (71.2 kg)  06/02/22 167 lb (75.8 kg)  11/04/21 181 lb (82.1 kg)     GEN:  Well nourished, well developed in no acute distress HEENT: Normal NECK: No JVD; No carotid bruits LYMPHATICS: No lymphadenopathy CARDIAC: RRR, no murmurs, no rubs, no gallops RESPIRATORY:  Clear to auscultation without rales, wheezing or rhonchi  ABDOMEN: Soft, non-tender, non-distended MUSCULOSKELETAL:  No edema; No  deformity  SKIN: Warm and dry LOWER EXTREMITIES: no swelling NEUROLOGIC:  Alert and oriented x 3 PSYCHIATRIC:  Normal affect   ASSESSMENT:    1. Paroxysmal atrial fibrillation (HCC)   2. CAD in native artery   3. Hx of CABG   4. Peripheral vascular disease, unspecified ([place]) status post carotic endarterectomy both sides    PLAN:    In order of problems listed above:  Paroxysmal atrial fibrillation.  Doing well from that point review.  I did review interrogation of the device which showed 1 brief episode of atrial fibrillation.  She is anticoagulated, dose is appropriate to her kidney function and weight as well as age.  Will continue. Coronary disease stable denies have any signs and symptoms of reactivation of the problem. Peripheral vascular disease, status post carotic endarterectomy on both sides.  Will do carotic ultrasounds of both sides. Dyslipidemia I do not have any fasting lipid profile on her, last one I see is from 6 months ago with LDL of 95 HDL 60.  I will suggest to have another fasting lipid profile done. Pacemaker interrogation has been reviewed.  5.8 years left in the device.  Continue monitoring   Medication Adjustments/Labs and Tests Ordered: Current medicines are reviewed at length with the patient today.  Concerns regarding medicines are outlined above.  No orders of the defined types were placed in this encounter.  Medication changes: No orders of the defined types were placed in this encounter.   Signed, Park Liter, MD, [facility] 07/25/2022 1:37 PM    [place]

## 2022-07-31 ENCOUNTER — Ambulatory Visit (HOSPITAL_COMMUNITY)
Admission: RE | Admit: 2022-07-31 | Discharge: 2022-07-31 | Disposition: A | Discharge status: Home / Self Care | Payer: Medicare HMO | Source: Ambulatory Visit | Attending: Cardiology | Admitting: Cardiology

## 2022-07-31 DIAGNOSIS — R0989 Other specified symptoms and signs involving the circulatory and respiratory systems: Secondary | ICD-10-CM | POA: Insufficient documentation

## 2022-07-31 LAB — LIPID PANEL
Chol/HDL Ratio: 2.4 ratio (ref 0.0–4.4)
Cholesterol, Total: 153 mg/dL (ref 100–199)
HDL: 65 mg/dL (ref >39)
LDL Chol Calc (NIH): 71 mg/dL (ref 0–99)
Triglycerides: 95 mg/dL (ref 0–149)
VLDL Cholesterol Cal: 17 mg/dL (ref 5–40)

## 2022-07-31 LAB — BASIC METABOLIC PANEL
BUN/Creatinine Ratio: 20 (ref 12–28)
BUN: 25 mg/dL (ref 8–27)
CO2: 25 mmol/L (ref 20–29)
Calcium: 10.3 mg/dL (ref 8.7–10.3)
Chloride: 89 mmol/L — ABNORMAL LOW (ref 96–106)
Creatinine, Ser: 1.23 mg/dL — ABNORMAL HIGH (ref 0.57–1.00)
Glucose: 142 mg/dL — ABNORMAL HIGH (ref 70–99)
Potassium: 4.4 mmol/L (ref 3.5–5.2)
Sodium: 130 mmol/L — ABNORMAL LOW (ref 134–144)
eGFR: 46 mL/min/{1.73_m2} — ABNORMAL LOW (ref >59)

## 2022-07-31 LAB — ALT: ALT: 15 IU/L (ref 0–32)

## 2022-07-31 LAB — AST: AST: 25 IU/L (ref 0–40)

## 2022-08-25 ENCOUNTER — Encounter: Payer: Self-pay | Admitting: Cardiology

## 2022-08-26 DIAGNOSIS — E871 Hypo-osmolality and hyponatremia: Secondary | ICD-10-CM | POA: Insufficient documentation

## 2022-08-28 DIAGNOSIS — Z789 Other specified health status: Secondary | ICD-10-CM | POA: Insufficient documentation

## 2022-08-28 DIAGNOSIS — Z7409 Other reduced mobility: Secondary | ICD-10-CM | POA: Insufficient documentation

## 2022-09-04 ENCOUNTER — Ambulatory Visit (INDEPENDENT_AMBULATORY_CARE_PROVIDER_SITE_OTHER): Payer: Medicare HMO

## 2022-09-04 DIAGNOSIS — I495 Sick sinus syndrome: Secondary | ICD-10-CM

## 2022-09-05 LAB — CUP PACEART REMOTE DEVICE CHECK
Battery Remaining Longevity: 64 mo
Battery Remaining Percentage: 55 %
Battery Voltage: 2.99 V
Brady Statistic AP VP Percent: 70 %
Brady Statistic AP VS Percent: 1.5 %
Brady Statistic AS VP Percent: 13 %
Brady Statistic AS VS Percent: 16 %
Brady Statistic RA Percent Paced: 71 %
Brady Statistic RV Percent Paced: 83 %
Date Time Interrogation Session: 20240321020014
Implantable Lead Connection Status: 753985
Implantable Lead Connection Status: 753985
Implantable Lead Implant Date: 20190909
Implantable Lead Implant Date: 20190909
Implantable Lead Location: 753859
Implantable Lead Location: 753860
Implantable Pulse Generator Implant Date: 20190909
Lead Channel Impedance Value: 390 Ohm
Lead Channel Impedance Value: 460 Ohm
Lead Channel Pacing Threshold Amplitude: 0.75 V
Lead Channel Pacing Threshold Amplitude: 0.75 V
Lead Channel Pacing Threshold Pulse Width: 0.4 ms
Lead Channel Pacing Threshold Pulse Width: 0.4 ms
Lead Channel Sensing Intrinsic Amplitude: 1.5 mV
Lead Channel Sensing Intrinsic Amplitude: 12 mV
Lead Channel Setting Pacing Amplitude: 2 V
Lead Channel Setting Pacing Amplitude: 2.5 V
Lead Channel Setting Pacing Pulse Width: 0.4 ms
Lead Channel Setting Sensing Sensitivity: 2 mV
Pulse Gen Model: 2272
Pulse Gen Serial Number: 9053085

## 2022-09-05 MED ORDER — LOSARTAN POTASSIUM 100 MG PO TABS: 100.0000 mg | ORAL_TABLET | Freq: Every day | ORAL | 3 refills | Status: DC

## 2022-09-13 DIAGNOSIS — I5032 Chronic diastolic (congestive) heart failure: Secondary | ICD-10-CM | POA: Insufficient documentation

## 2022-09-16 DIAGNOSIS — R269 Unspecified abnormalities of gait and mobility: Secondary | ICD-10-CM | POA: Insufficient documentation

## 2022-09-24 NOTE — Telephone Encounter (Signed)
Reached out to pt after not hearing back from her on the below message. She confirms that she is no longer on Irbesartan, and has resumed Losartan. She appreciates my follow up on this.

## 2022-09-29 DIAGNOSIS — R040 Epistaxis: Secondary | ICD-10-CM | POA: Insufficient documentation

## 2022-10-08 NOTE — Progress Notes (Signed)
Remote pacemaker transmission.   

## 2022-10-27 ENCOUNTER — Encounter: Payer: Self-pay | Admitting: Cardiology

## 2022-10-27 ENCOUNTER — Ambulatory Visit: Payer: Medicare HMO | Attending: Cardiology | Admitting: Cardiology

## 2022-10-27 VITALS — BP 172/84 | HR 56 | Ht 64.0 in | Wt 155.0 lb

## 2022-10-27 DIAGNOSIS — E782 Mixed hyperlipidemia: Secondary | ICD-10-CM

## 2022-10-27 DIAGNOSIS — I10 Essential (primary) hypertension: Secondary | ICD-10-CM | POA: Diagnosis not present

## 2022-10-27 DIAGNOSIS — I480 Paroxysmal atrial fibrillation: Secondary | ICD-10-CM

## 2022-10-27 DIAGNOSIS — I6523 Occlusion and stenosis of bilateral carotid arteries: Secondary | ICD-10-CM

## 2022-10-27 DIAGNOSIS — Z951 Presence of aortocoronary bypass graft: Secondary | ICD-10-CM | POA: Diagnosis not present

## 2022-10-27 NOTE — Patient Instructions (Signed)
Medication Instructions:  Your physician recommends that you continue on your current medications as directed. Please refer to the Current Medication list given to you today.  *If you need a refill on your cardiac medications before your next appointment, please call your pharmacy*   Lab Work: Your physician recommends that you return for lab work in: Pro BNP and BMET today If you have labs (blood work) drawn today and your tests are completely normal, you will receive your results only by: MyChart Message (if you have MyChart) OR A paper copy in the mail If you have any lab test that is abnormal or we need to change your treatment, we will call you to review the results.   Testing/Procedures: None   Follow-Up: At [facility], you and your health needs are our priority.  As part of our continuing mission to provide you with exceptional heart care, we have created designated Provider Care Teams.  These Care Teams include your primary Cardiologist (physician) and Advanced Practice Providers (APPs -  Physician Assistants and Nurse Practitioners) who all work together to provide you with the care you need, when you need it.  We recommend signing up for the patient portal called "MyChart".  Sign up information is provided on this After Visit Summary.  MyChart is used to connect with patients for Virtual Visits (Telemedicine).  Patients are able to view lab/test results, encounter notes, upcoming appointments, etc.  Non-urgent messages can be sent to your provider as well.   To learn more about what you can do with MyChart, go to [email or web address].    Your next appointment:   3 month(s)  Provider:   Gypsy Balsam, MD    Other Instructions

## 2022-10-27 NOTE — Addendum Note (Signed)
Addended by: Heywood Bene on: 10/27/2022 03:49 PM   Modules accepted: Orders

## 2022-10-27 NOTE — Progress Notes (Signed)
Cardiology Office Note:    Date:  10/27/2022   ID:  Diane Valentine, DOB 1950/11/12, MRN [id number]  PCP:  Woodroe Chen, MD  Cardiologist:  Gypsy Balsam, MD    Referring MD: Woodroe Chen, MD   Chief Complaint  Patient presents with   Leg Swelling   Congestive Heart Failure    History of Present Illness:    Diane Valentine is a 72 y.o. female with past medical history significant for coronary artery disease, status post coronary bypass graft years ago, status post broadside carotic endarterectomy, essential hypertension, dyslipidemia, paroxysmal atrial fibrillation, status post cryoablation done 3 times, she is suppressed with sotalol.  She is also anticoagulated.  She comes today to my office after being in the hospital twice because of decompensated CHF difficulty is that she respond quite nicely to diuretic but that usually results in her kidney dysfunction.  She comes today to months for follow-up she said shortness of breath quite fine.  But she does complain of having some swelling of EXTR all extremities pressure reading done.  No chest pain tightness squeezing pressure burning chest no palpitations.  Past Medical History:  Diagnosis Date   Acute leg pain, left 09/23/2016   Acute on chronic renal insufficiency    AKI (acute kidney injury) (HCC)    Alcohol abuse 02/18/2015   CAD in native artery 01/02/2019   Carotid stenosis, bilateral 01/02/2019   Essential hypertension 01/02/2019   Generalized anxiety disorder 01/02/2022   History of amiodarone therapy 01/02/2019   History of colon polyps 08/17/2015   History of MI (myocardial infarction) 08/16/2015   Hx of CABG 01/02/2019   Hypercalcemia 11/22/2016   Hyperglycemia 11/22/2016   Hyperlipidemia 01/02/2019   Obesity (BMI 30-39.9) 11/20/2020   Pacemaker reprogramming/check 03/04/2018   PAF (paroxysmal atrial fibrillation) (HCC) 01/02/2019   Personal history of cardiovascular disorder 08/20/2017   Posterior vitreous detachment of right  eye 09/17/2016   Prediabetes 04/07/2022   Presence of permanent cardiac pacemaker    Pseudophakia of both eyes 02/02/2019   Renal artery stenosis (HCC) 01/02/2019   Restless leg syndrome 01/02/2022   S/P ablation of atrial fibrillation 08/20/2017   Secondary hypercoagulable state (HCC) 04/19/2019   Shoulder lesion, unspecified, right shoulder 04/24/2021   Sick sinus syndrome (HCC) 01/02/2019   Stage 3b chronic kidney disease (HCC)    Thumb pain, left 04/20/2014   Vitreous floaters of right eye 09/17/2016    Past Surgical History:  Procedure Laterality Date   ATRIAL FIBRILLATION ABLATION N/A 03/22/2019   Procedure: ATRIAL FIBRILLATION ABLATION;  Surgeon: Regan Lemming, MD;  Location: [facility];  Service: Cardiovascular;  Laterality: N/A;   ATRIAL FIBRILLATION ABLATION N/A 04/04/2020   Procedure: ATRIAL FIBRILLATION ABLATION;  Surgeon: Regan Lemming, MD;  Location: [facility];  Service: Cardiovascular;  Laterality: N/A;   BOWEL RESECTION     CARDIOVERSION     CARDIOVERSION N/A 07/09/2021   Procedure: CARDIOVERSION (CATH LAB);  Surgeon: Regan Lemming, MD;  Location: [facility];  Service: Cardiovascular;  Laterality: N/A;   CORONARY ANGIOPLASTY WITH STENT PLACEMENT     CORONARY ARTERY BYPASS GRAFT  2016   x 3 vessels   CRYOABLATION     INSERT / REPLACE / REMOVE PACEMAKER      Current Medications: Current Meds  Medication Sig   acetaminophen (TYLENOL) 500 MG tablet Take 1,000 mg by mouth every 6 (six) hours as needed for moderate pain or headache.   albuterol (VENTOLIN HFA)  108 (90 Base) MCG/ACT inhaler Inhale 2 puffs into the lungs every 6 (six) hours as needed for wheezing or shortness of breath.    allopurinol (ZYLOPRIM) 100 MG tablet Take 0.5 tablets (50 mg total) by mouth daily.   aspirin EC 81 MG tablet Take 81 mg by mouth daily.    atorvastatin (LIPITOR) 40 MG tablet Take 40 mg by mouth every evening.   Biotin 16109 MCG TBDP Take 10,000 mcg  by mouth at bedtime.   ELIQUIS 5 MG TABS tablet TAKE 1 TABLET BY MOUTH TWICE A DAY (Patient taking differently: Take 5 mg by mouth 2 (two) times daily.)   famotidine (PEPCID) 20 MG tablet Take 20 mg by mouth 2 (two) times daily.   furosemide (LASIX) 40 MG tablet Take 20 mg by mouth 2 (two) times daily. Take 1/2 tablet by mouth twice daily   levothyroxine (SYNTHROID) 75 MCG tablet Take 75 mcg by mouth daily before breakfast.    losartan (COZAAR) 100 MG tablet Take 1 tablet (100 mg total) by mouth daily.   nitroGLYCERIN (NITROSTAT) 0.4 MG SL tablet Place 0.4 mg under the tongue every 5 (five) minutes x 3 doses as needed for chest pain.    Polyethyl Glycol-Propyl Glycol (SYSTANE) 0.4-0.3 % SOLN Place 1 drop into both eyes daily as needed (Dry eye).   Potassium Chloride ER 20 MEQ TBCR Take 10 mEq by mouth every other day. In the morning   pramipexole (MIRAPEX) 0.5 MG tablet Take 0.5 mg by mouth at bedtime.   saline (AYR) GEL Place 1 application into the nose daily as needed (dryness).   sotalol (BETAPACE) 120 MG tablet Take 1 tablet (120 mg total) by mouth 2 (two) times daily.   traZODone (DESYREL) 50 MG tablet Take 50 mg by mouth at bedtime as needed for sleep.   vitamin B-12 (CYANOCOBALAMIN) 1000 MCG tablet Take 1,000 mcg by mouth daily.   [DISCONTINUED] cloNIDine (CATAPRES) 0.1 MG tablet Take 0.1 mg by mouth 2 (two) times daily.   [DISCONTINUED] metoprolol succinate (TOPROL-XL) 50 MG 24 hr tablet TAKE 1 TABLET BY MOUTH EVERYDAY AT BEDTIME (Patient taking differently: Take 50 mg by mouth daily. TAKE 1 TABLET BY MOUTH EVERYDAY AT BEDTIME)   [DISCONTINUED] omeprazole (PRILOSEC) 20 MG capsule Take 20 mg by mouth 2 (two) times daily.     Allergies:   Amlodipine, Hydrocodone bit-homatrop mbr, Tizanidine, Ace inhibitors, and Rofecoxib   Social History   Socioeconomic History   Marital status: Married    Spouse name: Not on file   Number of children: Not on file   Years of education: Not on file    Highest education level: Not on file  Occupational History   Not on file  Tobacco Use   Smoking status: Former    Packs/day: 0.75    Years: 45.00    Additional pack years: 0.00    Total pack years: 33.75    Types: Cigarettes    Quit date: 2009    Years since quitting: 15.3   Smokeless tobacco: Never   Tobacco comments:    Former smoker 02/07/21  Vaping Use   Vaping Use: Never used  Substance and Sexual Activity   Alcohol use: Yes    Alcohol/week: 2.0 standard drinks of alcohol    Types: 2 Cans of beer per week    Comment: 2 beers daily   Drug use: Never   Sexual activity: Not on file  Other Topics Concern   Not on file  Social History  Narrative   Not on file   Social Determinants of Health   Financial Resource Strain: Not on file  Food Insecurity: Not on file  Transportation Needs: Not on file  Physical Activity: Not on file  Stress: Not on file  Social Connections: Not on file     Family History: The patient's family history includes Heart attack (age of onset: 58) in her father; Hypertension in her daughter, mother, sister, sister, and son. ROS:   Please see the history of present illness.    All 14 point review of systems negative except as described per history of present illness  EKGs/Labs/Other Studies Reviewed:      Recent Labs: 07/30/2022: ALT 15; BUN 25; Creatinine, Ser 1.23; Potassium 4.4; Sodium 130  Recent Lipid Panel    Component Value Date/Time   CHOL 153 07/30/2022 1358   TRIG 95 07/30/2022 1358   HDL 65 07/30/2022 1358   CHOLHDL 2.4 07/30/2022 1358   LDLCALC 71 07/30/2022 1358    Physical Exam:    VS:  BP (!) 172/84 (BP Location: Left Arm, Patient Position: Sitting)   Pulse (!) 56   Ht 5\' 4"  (1.626 m)   Wt 155 lb (70.3 kg)   SpO2 92%   BMI 26.61 kg/m     Wt Readings from Last 3 Encounters:  10/27/22 155 lb (70.3 kg)  07/25/22 157 lb (71.2 kg)  06/02/22 167 lb (75.8 kg)     GEN:  Well nourished, well developed in no acute  distress HEENT: Normal NECK: No JVD; No carotid bruits LYMPHATICS: No lymphadenopathy CARDIAC: RRR, no murmurs, no rubs, no gallops RESPIRATORY:  Clear to auscultation without rales, wheezing or rhonchi  ABDOMEN: Soft, non-tender, non-distended MUSCULOSKELETAL: Minimal swelling; No deformity  SKIN: Warm and dry LOWER EXTREMITIES: no swelling NEUROLOGIC:  Alert and oriented x 3 PSYCHIATRIC:  Normal affect   ASSESSMENT:    1. S/P CABG (coronary artery bypass graft)   2. Mixed hyperlipidemia   3. Paroxysmal atrial fibrillation (HCC)   4. Essential hypertension   5. Bilateral carotid artery stenosis    PLAN:    In order of problems listed above:  Postcoronary bypass graft.  Doing well from that point review continue present management. Did mixed dyslipidemia I did review K PN which show me her LDL of 71 HDL 65.  Continue present management she is on high intense statin from Lipitor 40. Essential hypertension: Blood pressure uncontrolled.  I will do Chem-7 today and then decide which way to proceed to get her blood pressure better lately she does have some component of diastolic dysfunction.  I did review echocardiogram done at [facility] which show left ventricle hypertrophy significant left atrial enlargement.  The goal will be to increase dose of diuretics a and watch her kidney function very carefully.  We may use additional medication to get her blood pressure under control. Bilateral carotic artery stenosis followed by group from [place].  She is scheduled to have carotic ultrasound.   Medication Adjustments/Labs and Tests Ordered: Current medicines are reviewed at length with the patient today.  Concerns regarding medicines are outlined above.  No orders of the defined types were placed in this encounter.  Medication changes: No orders of the defined types were placed in this encounter.   Signed, Georgeanna Lea, MD, [facility] 10/27/2022 3:38 PM    [facility]

## 2022-10-28 LAB — BASIC METABOLIC PANEL
BUN/Creatinine Ratio: 32 — ABNORMAL HIGH (ref 12–28)
BUN: 35 mg/dL — ABNORMAL HIGH (ref 8–27)
CO2: 22 mmol/L (ref 20–29)
Calcium: 9.8 mg/dL (ref 8.7–10.3)
Chloride: 96 mmol/L (ref 96–106)
Creatinine, Ser: 1.08 mg/dL — ABNORMAL HIGH (ref 0.57–1.00)
Glucose: 119 mg/dL — ABNORMAL HIGH (ref 70–99)
Potassium: 5 mmol/L (ref 3.5–5.2)
Sodium: 135 mmol/L (ref 134–144)
eGFR: 54 mL/min/{1.73_m2} — ABNORMAL LOW (ref >59)

## 2022-10-28 LAB — PRO B NATRIURETIC PEPTIDE: NT-Pro BNP: 4949 pg/mL — ABNORMAL HIGH (ref 0–301)

## 2022-10-29 ENCOUNTER — Encounter: Payer: Self-pay | Admitting: Cardiology

## 2022-10-30 ENCOUNTER — Other Ambulatory Visit: Payer: Self-pay

## 2022-10-30 DIAGNOSIS — R0600 Dyspnea, unspecified: Secondary | ICD-10-CM

## 2022-10-30 DIAGNOSIS — R5383 Other fatigue: Secondary | ICD-10-CM

## 2022-10-30 MED ORDER — FUROSEMIDE 40 MG PO TABS: ORAL_TABLET | ORAL | 3 refills | Status: DC

## 2022-11-03 ENCOUNTER — Encounter: Payer: Self-pay | Admitting: Cardiology

## 2022-11-05 ENCOUNTER — Telehealth: Payer: Self-pay | Admitting: Cardiology

## 2022-11-05 ENCOUNTER — Encounter: Payer: Self-pay | Admitting: Cardiology

## 2022-11-05 NOTE — Telephone Encounter (Signed)
Patient called stating she [facility] to [facility] [place]. If the lab order can can be released so when she goes there they will be able to see them so she can have her labs done there.  Thanks.

## 2022-11-05 NOTE — Telephone Encounter (Signed)
Advised the order has been released and she is good to have lab done at [facility]. Pt is notable short of breath on the phone and states that it has gotten worse. Pt states that she is unable to walk from 1 room to another without increased shortness of breath. Advised to go to the ED for evaluation as she could have a PE or other findings. Pt verbalized understanding and had no additional questions.

## 2022-11-11 ENCOUNTER — Other Ambulatory Visit: Payer: Self-pay

## 2022-11-26 ENCOUNTER — Ambulatory Visit: Payer: Medicare HMO | Attending: Cardiology | Admitting: Cardiology

## 2022-11-26 ENCOUNTER — Encounter: Payer: Self-pay | Admitting: Cardiology

## 2022-11-26 VITALS — BP 138/74 | HR 52 | Ht 64.0 in | Wt 157.0 lb

## 2022-11-26 DIAGNOSIS — I10 Essential (primary) hypertension: Secondary | ICD-10-CM | POA: Diagnosis not present

## 2022-11-26 DIAGNOSIS — I2510 Atherosclerotic heart disease of native coronary artery without angina pectoris: Secondary | ICD-10-CM

## 2022-11-26 DIAGNOSIS — I480 Paroxysmal atrial fibrillation: Secondary | ICD-10-CM | POA: Diagnosis not present

## 2022-11-26 DIAGNOSIS — I6523 Occlusion and stenosis of bilateral carotid arteries: Secondary | ICD-10-CM

## 2022-11-26 DIAGNOSIS — Z951 Presence of aortocoronary bypass graft: Secondary | ICD-10-CM

## 2022-11-26 NOTE — Addendum Note (Signed)
Addended by: Heywood Bene on: 11/26/2022 04:43 PM   Modules accepted: Orders

## 2022-11-26 NOTE — Addendum Note (Signed)
Addended by: Heywood Bene on: 11/26/2022 04:48 PM   Modules accepted: Orders

## 2022-11-26 NOTE — Patient Instructions (Signed)
Medication Instructions:  Your physician recommends that you continue on your current medications as directed. Please refer to the Current Medication list given to you today.  *If you need a refill on your cardiac medications before your next appointment, please call your pharmacy*   Lab Work: None If you have labs (blood work) drawn today and your tests are completely normal, you will receive your results only by: MyChart Message (if you have MyChart) OR A paper copy in the mail If you have any lab test that is abnormal or we need to change your treatment, we will call you to review the results.   Testing/Procedures: Your physician has requested that you have an echocardiogram. Echocardiography is a painless test that uses sound waves to create images of your heart. It provides your doctor with information about the size and shape of your heart and how well your heart's chambers and valves are working. This procedure takes approximately one hour. There are no restrictions for this procedure.    Follow-Up: At [facility], you and your health needs are our priority.  As part of our continuing mission to provide you with exceptional heart care, we have created designated Provider Care Teams.  These Care Teams include your primary Cardiologist (physician) and Advanced Practice Providers (APPs -  Physician Assistants and Nurse Practitioners) who all work together to provide you with the care you need, when you need it.  We recommend signing up for the patient portal called "MyChart".  Sign up information is provided on this After Visit Summary.  MyChart is used to connect with patients for Virtual Visits (Telemedicine).  Patients are able to view lab/test results, encounter notes, upcoming appointments, etc.  Non-urgent messages can be sent to your provider as well.   To learn more about what you can do with MyChart, go to [email or web address].     Other Instructions Echocardiogram An  echocardiogram is a test that uses sound waves (ultrasound) to produce images of the heart. Images from an echocardiogram can provide important information about: Heart size and shape. The size and thickness and movement of your heart's walls. Heart muscle function and strength. Heart valve function or if you have stenosis. Stenosis is when the heart valves are too narrow. If blood is flowing backward through the heart valves (regurgitation). A tumor or infectious growth around the heart valves. Areas of heart muscle that are not working well because of poor blood flow or injury from a heart attack. Aneurysm detection. An aneurysm is a weak or damaged part of an artery wall. The wall bulges out from the normal force of blood pumping through the body. Tell a health care provider about: Any allergies you have. All medicines you are taking, including vitamins, herbs, eye drops, creams, and over-the-counter medicines. Any blood disorders you have. Any surgeries you have had. Any medical conditions you have. Whether you are pregnant or may be pregnant. What are the risks? Generally, this is a safe test. However, problems may occur, including an allergic reaction to dye (contrast) that may be used during the test. What happens before the test? No specific preparation is needed. You may eat and drink normally. What happens during the test? You will take off your clothes from the waist up and put on a hospital gown. Electrodes or electrocardiogram (ECG)patches may be placed on your chest. The electrodes or patches are then connected to a device that monitors your heart rate and rhythm. You will lie down on a table for  an ultrasound exam. A gel will be applied to your chest to help sound waves pass through your skin. A handheld device, called a transducer, will be pressed against your chest and moved over your heart. The transducer produces sound waves that travel to your heart and bounce back (or  "echo" back) to the transducer. These sound waves will be captured in real-time and changed into images of your heart that can be viewed on a video monitor. The images will be recorded on a computer and reviewed by your health care provider. You may be asked to change positions or hold your breath for a short time. This makes it easier to get different views or better views of your heart. In some cases, you may receive contrast through an IV in one of your veins. This can improve the quality of the pictures from your heart. The procedure may vary among health care providers and hospitals.   What can I expect after the test? You may return to your normal, everyday life, including diet, activities, and medicines, unless your health care provider tells you not to do that. Follow these instructions at home: It is up to you to get the results of your test. Ask your health care provider, or the department that is doing the test, when your results will be ready. Keep all follow-up visits. This is important. Summary An echocardiogram is a test that uses sound waves (ultrasound) to produce images of the heart. Images from an echocardiogram can provide important information about the size and shape of your heart, heart muscle function, heart valve function, and other possible heart problems. You do not need to do anything to prepare before this test. You may eat and drink normally. After the echocardiogram is completed, you may return to your normal, everyday life, unless your health care provider tells you not to do that. This information is not intended to replace advice given to you by your health care provider. Make sure you discuss any questions you have with your health care provider. Document Revised: 01/24/2020 Document Reviewed: 01/24/2020 Elsevier Patient Education  2021 Elsevier Inc.      Follow-Up: At [facility], you and your health needs are our priority.  As part of our  continuing mission to provide you with exceptional heart care, we have created designated Provider Care Teams.  These Care Teams include your primary Cardiologist (physician) and Advanced Practice Providers (APPs -  Physician Assistants and Nurse Practitioners) who all work together to provide you with the care you need, when you need it.  We recommend signing up for the patient portal called "MyChart".  Sign up information is provided on this After Visit Summary.  MyChart is used to connect with patients for Virtual Visits (Telemedicine).  Patients are able to view lab/test results, encounter notes, upcoming appointments, etc.  Non-urgent messages can be sent to your provider as well.   To learn more about what you can do with MyChart, go to [email or web address].    Your next appointment:   3 month(s)  Provider:   Gypsy Balsam, MD    Other Instructions

## 2022-11-26 NOTE — Progress Notes (Signed)
Cardiology Office Note:    Date:  11/26/2022   ID:  Diane Valentine, DOB 15-Dec-1950, MRN [id number]  PCP:  Woodroe Chen, MD  Cardiologist:  Gypsy Balsam, MD    Referring MD: Woodroe Chen, MD     History of Present Illness:    Diane Valentine is a 72 y.o. female past medical history significant for coronary artery disease, status post coronary artery bypass graft done years ago, status post bilateral carotic endarterectomy, essential hypertension, dyslipidemia, paroxysmal atrial fibrillation, status post cryoablation done 3 times she is suppressed with sotalol, she was anticoagulated as well as she was taking aspirin however recently she ended up being in the hospital with severe blood loss anemia.  Apparently upper GI has been performed she was find some nonbleeding ulcers in the stomach but otherwise no source of bleeding has been identified.  She received few units of blood transfusion started feeling better but still hemoglobin fluctuating around 7 she feels miserable.  She is tired exhausted short of breath quite easily.  She also does have chronic lung condition with some COPD.  Past Medical History:  Diagnosis Date   Acute leg pain, left 09/23/2016   Acute on chronic renal insufficiency    AKI (acute kidney injury) (HCC)    Alcohol abuse 02/18/2015   CAD in native artery 01/02/2019   Carotid stenosis, bilateral 01/02/2019   Essential hypertension 01/02/2019   Generalized anxiety disorder 01/02/2022   History of amiodarone therapy 01/02/2019   History of colon polyps 08/17/2015   History of MI (myocardial infarction) 08/16/2015   Hx of CABG 01/02/2019   Hypercalcemia 11/22/2016   Hyperglycemia 11/22/2016   Hyperlipidemia 01/02/2019   Obesity (BMI 30-39.9) 11/20/2020   Pacemaker reprogramming/check 03/04/2018   PAF (paroxysmal atrial fibrillation) (HCC) 01/02/2019   Personal history of cardiovascular disorder 08/20/2017   Posterior vitreous detachment of right eye 09/17/2016   Prediabetes  04/07/2022   Presence of permanent cardiac pacemaker    Pseudophakia of both eyes 02/02/2019   Renal artery stenosis (HCC) 01/02/2019   Restless leg syndrome 01/02/2022   S/P ablation of atrial fibrillation 08/20/2017   Secondary hypercoagulable state (HCC) 04/19/2019   Shoulder lesion, unspecified, right shoulder 04/24/2021   Sick sinus syndrome (HCC) 01/02/2019   Stage 3b chronic kidney disease (HCC)    Thumb pain, left 04/20/2014   Vitreous floaters of right eye 09/17/2016    Past Surgical History:  Procedure Laterality Date   ATRIAL FIBRILLATION ABLATION N/A 03/22/2019   Procedure: ATRIAL FIBRILLATION ABLATION;  Surgeon: Regan Lemming, MD;  Location: [facility] INVASIVE CV LAB;  Service: Cardiovascular;  Laterality: N/A;   ATRIAL FIBRILLATION ABLATION N/A 04/04/2020   Procedure: ATRIAL FIBRILLATION ABLATION;  Surgeon: Regan Lemming, MD;  Location: [facility] INVASIVE CV LAB;  Service: Cardiovascular;  Laterality: N/A;   BOWEL RESECTION     CARDIOVERSION     CARDIOVERSION N/A 07/09/2021   Procedure: CARDIOVERSION (CATH LAB);  Surgeon: Regan Lemming, MD;  Location: [facility] CV LAB;  Service: Cardiovascular;  Laterality: N/A;   CORONARY ANGIOPLASTY WITH STENT PLACEMENT     CORONARY ARTERY BYPASS GRAFT  2016   x 3 vessels   CRYOABLATION     INSERT / REPLACE / REMOVE PACEMAKER      Current Medications: Current Meds  Medication Sig   acetaminophen (TYLENOL) 500 MG tablet Take 1,000 mg by mouth every 6 (six) hours as needed for moderate pain or headache.   albuterol (VENTOLIN HFA) 108 (90 Base) MCG/ACT inhaler Inhale  2 puffs into the lungs every 6 (six) hours as needed for wheezing or shortness of breath.    allopurinol (ZYLOPRIM) 100 MG tablet Take 0.5 tablets (50 mg total) by mouth daily.   atorvastatin (LIPITOR) 40 MG tablet Take 40 mg by mouth every evening.   Biotin 16109 MCG TBDP Take 10,000 mcg by mouth at bedtime.   famotidine (PEPCID) 20 MG tablet Take 20 mg by mouth 2 (two)  times daily.   furosemide (LASIX) 40 MG tablet Take 40 mg in the AM and take 20 mg in the afternoon (Patient taking differently: Take 20 mg by mouth See admin instructions. Take 40 mg in the AM and take 20 mg in the afternoon)   levothyroxine (SYNTHROID) 75 MCG tablet Take 75 mcg by mouth daily before breakfast.    nitroGLYCERIN (NITROSTAT) 0.4 MG SL tablet Place 0.4 mg under the tongue every 5 (five) minutes x 3 doses as needed for chest pain.    Polyethyl Glycol-Propyl Glycol (SYSTANE) 0.4-0.3 % SOLN Place 1 drop into both eyes daily as needed (Dry eye).   Potassium Chloride ER 20 MEQ TBCR Take 10 mEq by mouth every other day. In the morning   pramipexole (MIRAPEX) 0.5 MG tablet Take 0.5 mg by mouth at bedtime.   saline (AYR) GEL Place 1 application into the nose daily as needed (dryness).   sotalol (BETAPACE) 120 MG tablet Take 1 tablet (120 mg total) by mouth 2 (two) times daily.   traZODone (DESYREL) 50 MG tablet Take 50 mg by mouth at bedtime as needed for sleep.   vitamin B-12 (CYANOCOBALAMIN) 1000 MCG tablet Take 1,000 mcg by mouth daily.   [DISCONTINUED] aspirin EC 81 MG tablet Take 81 mg by mouth daily.    [DISCONTINUED] ELIQUIS 5 MG TABS tablet TAKE 1 TABLET BY MOUTH TWICE A DAY (Patient taking differently: Take 5 mg by mouth 2 (two) times daily.)   [DISCONTINUED] losartan (COZAAR) 100 MG tablet Take 1 tablet (100 mg total) by mouth daily.     Allergies:   Amlodipine, Hydrocodone bit-homatrop mbr, Tizanidine, Ace inhibitors, and Rofecoxib   Social History   Socioeconomic History   Marital status: Married    Spouse name: Not on file   Number of children: Not on file   Years of education: Not on file   Highest education level: Not on file  Occupational History   Not on file  Tobacco Use   Smoking status: Former    Packs/day: 0.75    Years: 45.00    Additional pack years: 0.00    Total pack years: 33.75    Types: Cigarettes    Quit date: 2009    Years since quitting: 15.4    Smokeless tobacco: Never   Tobacco comments:    Former smoker 02/07/21  Vaping Use   Vaping Use: Never used  Substance and Sexual Activity   Alcohol use: Yes    Alcohol/week: 2.0 standard drinks of alcohol    Types: 2 Cans of beer per week    Comment: 2 beers daily   Drug use: Never   Sexual activity: Not on file  Other Topics Concern   Not on file  Social History Narrative   Not on file   Social Determinants of Health   Financial Resource Strain: Not on file  Food Insecurity: Not on file  Transportation Needs: Not on file  Physical Activity: Not on file  Stress: Not on file  Social Connections: Not on file  Family History: The patient's family history includes Heart attack (age of onset: 49) in her father; Hypertension in her daughter, mother, sister, sister, and son. ROS:   Please see the history of present illness.    All 14 point review of systems negative except as described per history of present illness  EKGs/Labs/Other Studies Reviewed:      Recent Labs: 07/30/2022: ALT 15 10/27/2022: BUN 35; Creatinine, Ser 1.08; NT-Pro BNP 4,949; Potassium 5.0; Sodium 135  Recent Lipid Panel    Component Value Date/Time   CHOL 153 07/30/2022 1358   TRIG 95 07/30/2022 1358   HDL 65 07/30/2022 1358   CHOLHDL 2.4 07/30/2022 1358   LDLCALC 71 07/30/2022 1358    Physical Exam:    VS:  BP 138/74 (BP Location: Left Arm, Patient Position: Sitting)   Pulse (!) 52   Ht 5\' 4"  (1.626 m)   Wt 157 lb (71.2 kg)   SpO2 96%   BMI 26.95 kg/m     Wt Readings from Last 3 Encounters:  11/26/22 157 lb (71.2 kg)  10/27/22 155 lb (70.3 kg)  07/25/22 157 lb (71.2 kg)     GEN:  Well nourished, well developed in no acute distress HEENT: Normal NECK: No JVD; No carotid bruits LYMPHATICS: No lymphadenopathy CARDIAC: RRR, no murmurs, no rubs, no gallops RESPIRATORY:  Clear to auscultation without rales, wheezing or rhonchi  ABDOMEN: Soft, non-tender,  non-distended MUSCULOSKELETAL:  No edema; No deformity  SKIN: Warm and dry LOWER EXTREMITIES: no swelling NEUROLOGIC:  Alert and oriented x 3 PSYCHIATRIC:  Normal affect   ASSESSMENT:    1. PAF (paroxysmal atrial fibrillation) (HCC)   2. CAD in native artery   3. Essential hypertension   4. Occlusion and stenosis of bilateral carotid arteries   5. S/P CABG (coronary artery bypass graft)    PLAN:    In order of problems listed above:  Paroxysmal atrial fibrillation, heart rate is regular today, will continue monitoring, off anticoagulation because of bleeding in the future we will consider Watchman device. Coronary disease stable denies have any chest pain tightness squeezing pressure burning chest. Essential hypertension blood pressure well-controlled continue present management. Anemia which is blood loss anemia of course leading complain and concern she does have some workup plan my understanding is that she will have either colonoscopy or capsule endoscopy may be MRI of whole body trying to find a source of bleeding.  It would be ideal for her to keep her hemoglobin above 8. History of congestive heart failure she does have some swelling of lower extremities she is on Lasix however she also had does have some kidney dysfunction.  Will continue monitoring, I will schedule her to have echocardiogram if she does not have any cardiomyopathy   Medication Adjustments/Labs and Tests Ordered: Current medicines are reviewed at length with the patient today.  Concerns regarding medicines are outlined above.  No orders of the defined types were placed in this encounter.  Medication changes: No orders of the defined types were placed in this encounter.   Signed, Georgeanna Lea, MD, [facility] 11/26/2022 4:38 PM    [facility]

## 2022-11-29 ENCOUNTER — Other Ambulatory Visit: Payer: Self-pay | Admitting: Cardiology

## 2022-12-01 ENCOUNTER — Encounter: Payer: Self-pay | Admitting: Cardiology

## 2022-12-01 ENCOUNTER — Ambulatory Visit: Payer: Medicare HMO | Attending: Cardiology | Admitting: Cardiology

## 2022-12-01 VITALS — BP 116/64 | HR 54 | Ht 64.0 in | Wt 161.0 lb

## 2022-12-01 DIAGNOSIS — D6869 Other thrombophilia: Secondary | ICD-10-CM | POA: Diagnosis not present

## 2022-12-01 DIAGNOSIS — I10 Essential (primary) hypertension: Secondary | ICD-10-CM | POA: Diagnosis not present

## 2022-12-01 DIAGNOSIS — Z79899 Other long term (current) drug therapy: Secondary | ICD-10-CM | POA: Diagnosis not present

## 2022-12-01 DIAGNOSIS — I480 Paroxysmal atrial fibrillation: Secondary | ICD-10-CM

## 2022-12-01 DIAGNOSIS — I5033 Acute on chronic diastolic (congestive) heart failure: Secondary | ICD-10-CM | POA: Diagnosis not present

## 2022-12-01 DIAGNOSIS — I495 Sick sinus syndrome: Secondary | ICD-10-CM

## 2022-12-01 NOTE — Progress Notes (Signed)
  Electrophysiology Office Note:   Date:  12/01/2022  ID:  Diane Valentine, DOB Aug 14, 1950, MRN [id number]  Primary Cardiologist: None Electrophysiologist: Jonathan Corpus Jorja Loa, MD      History of Present Illness:   Diane Valentine is a 72 y.o. female with h/o coronary artery disease post CABG, carotid artery disease post endarterectomy, hypertension, hyperlipidemia, atrial fibrillation post ablation now on sotalol.  Seen today for routine electrophysiology followup.  Since last being seen in our clinic the patient reports doing.  Diane was recently hospitalized with a GI bleed.  Diane was found to have nonbleeding ulcers in the stomach, though no other source was identified.  Diane has received blood transfusions.  Diane previously had admission for bronchitis and pneumonia.  During each admission, Diane required quite a bit of diuresis..  Today, Diane continues to feel weak and fatigued.  Her main complaint also is significant edema in her lower extremities.  Diane feels quite bloated as well.  Review of systems complete and found to be negative unless listed in HPI.   Device History: Geophysical data processor PPM for Sick sinus syndrome   Studies Reviewed:    PPM Interrogation-  reviewed in detail today,  See PACEART report.  EKG is not ordered today. EKG from 10/27/22 reviewed which showed sinus rhythm  Risk Assessment/Calculations:    CHA2DS2-VASc Score = 4   This indicates a 4.8% annual risk of stroke. The patient's score is based upon: CHF History: 0 HTN History: 1 Diabetes History: 0 Stroke History: 0 Vascular Disease History: 1 Age Score: 1 Gender Score: 1             Physical Exam:   VS:  BP 116/64   Pulse (!) 54   Ht 5\' 4"  (1.626 m)   Wt 161 lb (73 kg)   SpO2 98%   BMI 27.64 kg/m    Wt Readings from Last 3 Encounters:  12/01/22 161 lb (73 kg)  11/26/22 157 lb (71.2 kg)  10/27/22 155 lb (70.3 kg)     GEN: Well nourished, well developed in no acute distress NECK: + JVD; No carotid  bruits CARDIAC: Regular rate and rhythm, no murmurs, rubs, gallops RESPIRATORY:  Clear to auscultation without rales, wheezing or rhonchi  ABDOMEN: Soft, non-tender, non-distended EXTREMITIES:  2+ edema; No deformity   ASSESSMENT AND PLAN:    Sick sinus syndrome s/p Abbott PPM  Normal PPM function See Pace Art report No changes today  2.  Paroxysmal atrial fibrillation: Status post ablation, most recently 04/05/2020.  Currently on sotalol.  CHA2DS2-VASc of 4.  Minimal episodes of atrial fibrillation.  3.  Hypertension: Currently well-controlled  4.  Hyperlipidemia: Continue statin per primary cardiology  5.  Secondary hypercoagulable state: Currently on Eliquis for atrial fibrillation  6.  High risk medication monitoring: QTc remained stable.  On sotalol.  Recent labs within normal limits.  7.  Acute on chronic diastolic heart failure: Has significant lower extremity edema.  Diane Valentine increase Lasix to 80 mg twice daily and give 30 mill equivalents of potassium.  Diane Valentine follow-up with primary cardiology.  Disposition:   Follow up with Dr. Elberta Fortis in 6 months  Signed, Allice Garro Jorja Loa, MD

## 2022-12-01 NOTE — Patient Instructions (Addendum)
Medication Instructions:  Your physician has recommended you make the following change in your medication: INCREASE Lasix to 80 mg  twice a day for 5 days, then return to normal dosing. INCREASE Potassium to 30 mEq daily for 5 days, then return to normal dosing  *If you need a refill on your cardiac medications before your next appointment, please call your pharmacy*   Lab Work: None ordered   Testing/Procedures: None ordered   Follow-Up: At [facility], you and your health needs are our priority.  As part of our continuing mission to provide you with exceptional heart care, we have created designated Provider Care Teams.  These Care Teams include your primary Cardiologist (physician) and Advanced Practice Providers (APPs -  Physician Assistants and Nurse Practitioners) who all work together to provide you with the care you need, when you need it.  Remote monitoring is used to monitor your Pacemaker or ICD from home. This monitoring reduces the number of office visits required to check your device to one time per year. It allows [place] to keep an eye on the functioning of your device to ensure it is working properly. You are scheduled for a device check from home on 6/20, 9/19. You may send your transmission at any time that day. If you have a wireless device, the transmission will be sent automatically. After your physician reviews your transmission, you will receive a postcard with your next transmission date.  Your physician recommends that you schedule a follow-up appointment in: 2 weeks with Dr. Bing Matter or Wallis Bamberg, NP  Your next appointment:   6 month(s)  The format for your next appointment:   In Person  Provider:   Loman Brooklyn, MD    Thank you for choosing [facility]!!   Dory Horn, RN [phone]    Other Instructions

## 2022-12-02 ENCOUNTER — Other Ambulatory Visit: Payer: Self-pay | Admitting: Cardiology

## 2022-12-02 ENCOUNTER — Telehealth: Payer: Self-pay | Admitting: Cardiology

## 2022-12-02 DIAGNOSIS — Z79899 Other long term (current) drug therapy: Secondary | ICD-10-CM

## 2022-12-02 MED ORDER — POTASSIUM CHLORIDE ER 10 MEQ PO TBCR
10.0000 meq | EXTENDED_RELEASE_TABLET | Freq: Three times a day (TID) | ORAL | 0 refills | Status: AC
Start: 2022-12-02 — End: 2022-12-07

## 2022-12-02 NOTE — Telephone Encounter (Signed)
Rx sent to reflect new medication instructions.

## 2022-12-02 NOTE — Telephone Encounter (Signed)
Pt c/o medication issue:  1. Name of Medication:  Potassium Chloride ER 10 MEQ  2. How are you currently taking this medication (dosage and times per day)?   3. Are you having a reaction (difficulty breathing--STAT)?   4. What is your medication issue?    Patient states Dr. Elberta Fortis increased her medication to 3 capsules daily temporarily, but she does not have enough medication and the pharmacy advised that they are unable to refill it because it is too soon.  [id number] - [place], [place] - [id number]

## 2022-12-04 ENCOUNTER — Ambulatory Visit (INDEPENDENT_AMBULATORY_CARE_PROVIDER_SITE_OTHER): Payer: Medicare HMO

## 2022-12-04 DIAGNOSIS — I495 Sick sinus syndrome: Secondary | ICD-10-CM | POA: Diagnosis not present

## 2022-12-04 LAB — CUP PACEART REMOTE DEVICE CHECK
Battery Remaining Longevity: 62 mo
Battery Remaining Percentage: 52 %
Battery Voltage: 2.99 V
Brady Statistic AP VP Percent: 50 %
Brady Statistic AP VS Percent: 2.2 %
Brady Statistic AS VP Percent: 19 %
Brady Statistic AS VS Percent: 29 %
Brady Statistic RA Percent Paced: 52 %
Brady Statistic RV Percent Paced: 69 %
Date Time Interrogation Session: 20240620020018
Implantable Lead Connection Status: 753985
Implantable Lead Connection Status: 753985
Implantable Lead Implant Date: 20190909
Implantable Lead Implant Date: 20190909
Implantable Lead Location: 753859
Implantable Lead Location: 753860
Implantable Pulse Generator Implant Date: 20190909
Lead Channel Impedance Value: 380 Ohm
Lead Channel Impedance Value: 480 Ohm
Lead Channel Pacing Threshold Amplitude: 0.75 V
Lead Channel Pacing Threshold Amplitude: 1 V
Lead Channel Pacing Threshold Pulse Width: 0.4 ms
Lead Channel Pacing Threshold Pulse Width: 0.4 ms
Lead Channel Sensing Intrinsic Amplitude: 1.5 mV
Lead Channel Sensing Intrinsic Amplitude: 12 mV
Lead Channel Setting Pacing Amplitude: 2 V
Lead Channel Setting Pacing Amplitude: 2.5 V
Lead Channel Setting Pacing Pulse Width: 0.4 ms
Lead Channel Setting Sensing Sensitivity: 2 mV
Pulse Gen Model: 2272
Pulse Gen Serial Number: 9053085
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
## 2022-12-16 ENCOUNTER — Encounter: Payer: Self-pay | Admitting: Cardiology

## 2022-12-16 ENCOUNTER — Ambulatory Visit: Payer: Medicare HMO | Admitting: Cardiology

## 2022-12-16 VITALS — BP 144/70 | HR 57 | Ht 64.0 in | Wt 164.0 lb

## 2022-12-16 DIAGNOSIS — N179 Acute kidney failure, unspecified: Secondary | ICD-10-CM

## 2022-12-16 DIAGNOSIS — Z9889 Other specified postprocedural states: Secondary | ICD-10-CM

## 2022-12-16 DIAGNOSIS — I6523 Occlusion and stenosis of bilateral carotid arteries: Secondary | ICD-10-CM

## 2022-12-16 DIAGNOSIS — I10 Essential (primary) hypertension: Secondary | ICD-10-CM | POA: Diagnosis not present

## 2022-12-16 DIAGNOSIS — M79605 Pain in left leg: Secondary | ICD-10-CM

## 2022-12-16 DIAGNOSIS — I4719 Other supraventricular tachycardia: Secondary | ICD-10-CM

## 2022-12-16 DIAGNOSIS — N1832 Chronic kidney disease, stage 3b: Secondary | ICD-10-CM

## 2022-12-16 DIAGNOSIS — I824Y2 Acute embolism and thrombosis of unspecified deep veins of left proximal lower extremity: Secondary | ICD-10-CM

## 2022-12-16 DIAGNOSIS — K559 Vascular disorder of intestine, unspecified: Secondary | ICD-10-CM

## 2022-12-16 DIAGNOSIS — E782 Mixed hyperlipidemia: Secondary | ICD-10-CM

## 2022-12-16 DIAGNOSIS — E871 Hypo-osmolality and hyponatremia: Secondary | ICD-10-CM

## 2022-12-16 DIAGNOSIS — I4819 Other persistent atrial fibrillation: Secondary | ICD-10-CM

## 2022-12-16 DIAGNOSIS — R040 Epistaxis: Secondary | ICD-10-CM

## 2022-12-16 DIAGNOSIS — R001 Bradycardia, unspecified: Secondary | ICD-10-CM

## 2022-12-16 DIAGNOSIS — Z789 Other specified health status: Secondary | ICD-10-CM

## 2022-12-16 DIAGNOSIS — H43811 Vitreous degeneration, right eye: Secondary | ICD-10-CM

## 2022-12-16 DIAGNOSIS — Z86010 Personal history of colonic polyps: Secondary | ICD-10-CM

## 2022-12-16 DIAGNOSIS — Z951 Presence of aortocoronary bypass graft: Secondary | ICD-10-CM

## 2022-12-16 DIAGNOSIS — I739 Peripheral vascular disease, unspecified: Secondary | ICD-10-CM

## 2022-12-16 DIAGNOSIS — E669 Obesity, unspecified: Secondary | ICD-10-CM

## 2022-12-16 DIAGNOSIS — G2581 Restless legs syndrome: Secondary | ICD-10-CM

## 2022-12-16 DIAGNOSIS — R269 Unspecified abnormalities of gait and mobility: Secondary | ICD-10-CM

## 2022-12-16 DIAGNOSIS — I824Z2 Acute embolism and thrombosis of unspecified deep veins of left distal lower extremity: Secondary | ICD-10-CM

## 2022-12-16 DIAGNOSIS — Z961 Presence of intraocular lens: Secondary | ICD-10-CM

## 2022-12-16 DIAGNOSIS — I252 Old myocardial infarction: Secondary | ICD-10-CM

## 2022-12-16 DIAGNOSIS — I701 Atherosclerosis of renal artery: Secondary | ICD-10-CM

## 2022-12-16 DIAGNOSIS — H43391 Other vitreous opacities, right eye: Secondary | ICD-10-CM

## 2022-12-16 DIAGNOSIS — I2510 Atherosclerotic heart disease of native coronary artery without angina pectoris: Secondary | ICD-10-CM

## 2022-12-16 DIAGNOSIS — M778 Other enthesopathies, not elsewhere classified: Secondary | ICD-10-CM

## 2022-12-16 DIAGNOSIS — K2100 Gastro-esophageal reflux disease with esophagitis, without bleeding: Secondary | ICD-10-CM

## 2022-12-16 DIAGNOSIS — K635 Polyp of colon: Secondary | ICD-10-CM

## 2022-12-16 DIAGNOSIS — Z9229 Personal history of other drug therapy: Secondary | ICD-10-CM

## 2022-12-16 DIAGNOSIS — E039 Hypothyroidism, unspecified: Secondary | ICD-10-CM

## 2022-12-16 DIAGNOSIS — M79645 Pain in left finger(s): Secondary | ICD-10-CM

## 2022-12-16 DIAGNOSIS — Z7409 Other reduced mobility: Secondary | ICD-10-CM

## 2022-12-16 DIAGNOSIS — I5032 Chronic diastolic (congestive) heart failure: Secondary | ICD-10-CM

## 2022-12-16 DIAGNOSIS — M7591 Shoulder lesion, unspecified, right shoulder: Secondary | ICD-10-CM

## 2022-12-16 DIAGNOSIS — D6869 Other thrombophilia: Secondary | ICD-10-CM

## 2022-12-16 DIAGNOSIS — R7303 Prediabetes: Secondary | ICD-10-CM

## 2022-12-16 DIAGNOSIS — I480 Paroxysmal atrial fibrillation: Secondary | ICD-10-CM

## 2022-12-16 DIAGNOSIS — I495 Sick sinus syndrome: Secondary | ICD-10-CM

## 2022-12-16 DIAGNOSIS — F1010 Alcohol abuse, uncomplicated: Secondary | ICD-10-CM

## 2022-12-16 DIAGNOSIS — Z8679 Personal history of other diseases of the circulatory system: Secondary | ICD-10-CM

## 2022-12-16 DIAGNOSIS — N189 Chronic kidney disease, unspecified: Secondary | ICD-10-CM

## 2022-12-16 DIAGNOSIS — Z45018 Encounter for adjustment and management of other part of cardiac pacemaker: Secondary | ICD-10-CM

## 2022-12-16 DIAGNOSIS — I5030 Unspecified diastolic (congestive) heart failure: Secondary | ICD-10-CM

## 2022-12-16 DIAGNOSIS — R739 Hyperglycemia, unspecified: Secondary | ICD-10-CM

## 2022-12-16 DIAGNOSIS — I82402 Acute embolism and thrombosis of unspecified deep veins of left lower extremity: Secondary | ICD-10-CM

## 2022-12-16 DIAGNOSIS — M75121 Complete rotator cuff tear or rupture of right shoulder, not specified as traumatic: Secondary | ICD-10-CM

## 2022-12-16 DIAGNOSIS — F411 Generalized anxiety disorder: Secondary | ICD-10-CM

## 2022-12-16 MED ORDER — TORSEMIDE 20 MG PO TABS: ORAL_TABLET | ORAL | 3 refills | Status: DC

## 2022-12-16 NOTE — Patient Instructions (Signed)
Medication Instructions:  Your physician has recommended you make the following change in your medication:   Stop Lasix  Start Torsemide 40 mg (2 tablets) in the am and 20 mg (1 tablet) in the evening for 3 days, then decrease to 20 mg (1 tablet) twice daily.  *If you need a refill on your cardiac medications before your next appointment, please call your pharmacy*   Lab Work: Your physician recommends that you have a BMP today  Your physician recommends that you return for lab work in: 1 week for a BMP. You can come Monday through Friday 8:30 am to 12:00 pm and 1:15 to 4:30. You do not need to make an appointment as the order has already been placed.   If you have labs (blood work) drawn today and your tests are completely normal, you will receive your results only by: MyChart Message (if you have MyChart) OR A paper copy in the mail If you have any lab test that is abnormal or we need to change your treatment, we will call you to review the results.   Testing/Procedures: None ordered   Follow-Up: At [facility], you and your health needs are our priority.  As part of our continuing mission to provide you with exceptional heart care, we have created designated Provider Care Teams.  These Care Teams include your primary Cardiologist (physician) and Advanced Practice Providers (APPs -  Physician Assistants and Nurse Practitioners) who all work together to provide you with the care you need, when you need it.  We recommend signing up for the patient portal called "MyChart".  Sign up information is provided on this After Visit Summary.  MyChart is used to connect with patients for Virtual Visits (Telemedicine).  Patients are able to view lab/test results, encounter notes, upcoming appointments, etc.  Non-urgent messages can be sent to your provider as well.   To learn more about what you can do with MyChart, go to [email or web address].    Your next appointment:   2-3  week(s)  The format for your next appointment:   In Person  Provider:   Gypsy Balsam, MD    Other Instructions none  Important Information About Sugar

## 2022-12-17 ENCOUNTER — Ambulatory Visit (HOSPITAL_BASED_OUTPATIENT_CLINIC_OR_DEPARTMENT_OTHER)
Admission: RE | Admit: 2022-12-17 | Discharge: 2022-12-17 | Disposition: A | Discharge status: Home / Self Care | Payer: Medicare HMO | Source: Ambulatory Visit | Attending: Cardiology | Admitting: Cardiology

## 2022-12-17 DIAGNOSIS — I2510 Atherosclerotic heart disease of native coronary artery without angina pectoris: Secondary | ICD-10-CM | POA: Diagnosis present

## 2022-12-17 DIAGNOSIS — I10 Essential (primary) hypertension: Secondary | ICD-10-CM

## 2022-12-17 DIAGNOSIS — Z951 Presence of aortocoronary bypass graft: Secondary | ICD-10-CM | POA: Diagnosis present

## 2022-12-17 DIAGNOSIS — I495 Sick sinus syndrome: Secondary | ICD-10-CM | POA: Insufficient documentation

## 2022-12-17 DIAGNOSIS — I5032 Chronic diastolic (congestive) heart failure: Secondary | ICD-10-CM | POA: Diagnosis present

## 2022-12-17 DIAGNOSIS — I480 Paroxysmal atrial fibrillation: Secondary | ICD-10-CM | POA: Diagnosis present

## 2022-12-17 DIAGNOSIS — I5030 Unspecified diastolic (congestive) heart failure: Secondary | ICD-10-CM | POA: Diagnosis present

## 2022-12-17 DIAGNOSIS — I6523 Occlusion and stenosis of bilateral carotid arteries: Secondary | ICD-10-CM | POA: Insufficient documentation

## 2022-12-17 DIAGNOSIS — I4819 Other persistent atrial fibrillation: Secondary | ICD-10-CM | POA: Diagnosis present

## 2022-12-17 DIAGNOSIS — D6869 Other thrombophilia: Secondary | ICD-10-CM | POA: Insufficient documentation

## 2022-12-17 LAB — ECHOCARDIOGRAM COMPLETE
AR max vel: 1.73 cm2
AV Area VTI: 2.1 cm2
AV Area mean vel: 1.86 cm2
AV Mean grad: 9 mmHg
AV Peak grad: 21.3 mmHg
Ao pk vel: 2.31 m/s
Area-P 1/2: 4.1 cm2
MV M vel: 4.71 m/s
MV Peak grad: 88.7 mmHg
Radius: 0.4 cm
S' Lateral: 4.1 cm

## 2022-12-17 LAB — BASIC METABOLIC PANEL WITH GFR
BUN/Creatinine Ratio: 20 (ref 12–28)
BUN: 27 mg/dL (ref 8–27)
CO2: 26 mmol/L (ref 20–29)
Calcium: 9.5 mg/dL (ref 8.7–10.3)
Chloride: 95 mmol/L — ABNORMAL LOW (ref 96–106)
Creatinine, Ser: 1.32 mg/dL — ABNORMAL HIGH (ref 0.57–1.00)
Glucose: 98 mg/dL (ref 70–99)
Potassium: 4.2 mmol/L (ref 3.5–5.2)
Sodium: 135 mmol/L (ref 134–144)
eGFR: 43 mL/min/1.73 — ABNORMAL LOW

## 2022-12-24 ENCOUNTER — Telehealth: Payer: Self-pay | Admitting: Cardiology

## 2022-12-24 NOTE — Progress Notes (Signed)
Remote pacemaker transmission.   
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
## 2023-01-01 ENCOUNTER — Encounter: Payer: Self-pay | Admitting: Cardiology

## 2023-01-01 ENCOUNTER — Ambulatory Visit: Payer: Medicare HMO | Attending: Cardiology | Admitting: Cardiology

## 2023-01-01 VITALS — BP 112/80 | HR 55 | Ht 64.0 in | Wt 160.4 lb

## 2023-01-01 DIAGNOSIS — I10 Essential (primary) hypertension: Secondary | ICD-10-CM

## 2023-01-01 DIAGNOSIS — I480 Paroxysmal atrial fibrillation: Secondary | ICD-10-CM

## 2023-01-01 DIAGNOSIS — I071 Rheumatic tricuspid insufficiency: Secondary | ICD-10-CM

## 2023-01-01 DIAGNOSIS — Z951 Presence of aortocoronary bypass graft: Secondary | ICD-10-CM | POA: Diagnosis not present

## 2023-01-01 DIAGNOSIS — I5030 Unspecified diastolic (congestive) heart failure: Secondary | ICD-10-CM

## 2023-01-01 DIAGNOSIS — I2510 Atherosclerotic heart disease of native coronary artery without angina pectoris: Secondary | ICD-10-CM | POA: Diagnosis not present

## 2023-01-01 MED ORDER — TORSEMIDE 20 MG PO TABS: 40.0000 mg | ORAL_TABLET | Freq: Every day | ORAL | 3 refills | Status: DC

## 2023-01-01 NOTE — Patient Instructions (Addendum)
Medication Instructions:  Your physician has recommended you make the following change in your medication:  Increase Torsemide to 40 mg once daily and an extra 20 mg daily as needed for weight gain of 3 pds in 1 day  *If you need a refill on your cardiac medications before your next appointment, please call your pharmacy*   Lab Work: NONE If you have labs (blood work) drawn today and your tests are completely normal, you will receive your results only by: MyChart Message (if you have MyChart) OR A paper copy in the mail If you have any lab test that is abnormal or we need to change your treatment, we will call you to review the results.   Testing/Procedures: NONE   Follow-Up: At [facility], you and your health needs are our priority.  As part of our continuing mission to provide you with exceptional heart care, we have created designated Provider Care Teams.  These Care Teams include your primary Cardiologist (physician) and Advanced Practice Providers (APPs -  Physician Assistants and Nurse Practitioners) who all work together to provide you with the care you need, when you need it.  We recommend signing up for the patient portal called "MyChart".  Sign up information is provided on this After Visit Summary.  MyChart is used to connect with patients for Virtual Visits (Telemedicine).  Patients are able to view lab/test results, encounter notes, upcoming appointments, etc.  Non-urgent messages can be sent to your provider as well.   To learn more about what you can do with MyChart, go to [email or web address].    Your next appointment:   3 month(s)  Provider:   Gypsy Balsam, MD    Other Instructions

## 2023-01-07 ENCOUNTER — Telehealth: Payer: Self-pay | Admitting: Cardiology

## 2023-01-07 DIAGNOSIS — I10 Essential (primary) hypertension: Secondary | ICD-10-CM

## 2023-01-07 MED ORDER — EMPAGLIFLOZIN 10 MG PO TABS: 10.0000 mg | ORAL_TABLET | Freq: Every day | ORAL | 3 refills | Status: DC

## 2023-01-07 NOTE — Addendum Note (Signed)
Addended by: Baldo Ash D on: 01/07/2023 05:15 PM   Modules accepted: Orders

## 2023-01-07 NOTE — Telephone Encounter (Signed)
Spoke with pt regarding Diane Valentine note:  Lets start her on Jardiance 10 mg daily.   Return in 2 weeks for repeat BMET.    Once she has been on this a few weeks, she can back down on her torsemide.   Advise her that Jardiance pulls sugar and fluid out of her body, so she has to make sure she stays clean after urinating, as there is a slight risk (2%) of UTIs and yeast infections with this if people do not maintain good hygiene.   Pt agreed and verbalized understanding. She will call if her swelling does not start to resolve.

## 2023-01-07 NOTE — Telephone Encounter (Signed)
Pt c/o medication issue:  1. Name of Medication:  torsemide (DEMADEX) 20 MG tablet  2. How are you currently taking this medication (dosage and times per day)?  2 tablets (40 MG total + an extra 20 MG as needed)   3. Are you having a reaction (difficulty breathing--STAT)?   4. What is your medication issue?   Patient states the medication is not working.

## 2023-01-19 ENCOUNTER — Telehealth: Payer: Self-pay | Admitting: Cardiology

## 2023-01-19 NOTE — Telephone Encounter (Signed)
Patient is calling because she is currently admitted into [facility]. Patient stated that she need to clarify her medications and dosage. Patient stated that doctor at the hospital was requesting to know and she doesn't remember since we just changed it at her last visit in July. Please advise.

## 2023-01-20 NOTE — Telephone Encounter (Signed)
Attempted to call the patient. Call could not be completed as dialed.Tried to call the patient multiple times and got the same message.

## 2023-01-21 NOTE — Telephone Encounter (Signed)
Attempted to call the patient multiple times and got the same message that said call could not be completed as dialed.

## 2023-01-30 ENCOUNTER — Ambulatory Visit: Payer: Medicare HMO | Admitting: Cardiology

## 2023-02-15 DEATH — deceased

## 2023-02-27 ENCOUNTER — Ambulatory Visit: Payer: Medicare HMO | Admitting: Cardiology

## 2023-03-30 ENCOUNTER — Telehealth: Payer: Self-pay | Admitting: Cardiology

## 2023-03-30 NOTE — Telephone Encounter (Signed)
[place], pt's son is requesting a callback from device clinic regarding patient passing away and him not knowing what to do with her machine. Please advise.

## 2023-03-31 NOTE — Telephone Encounter (Signed)
LMOVM for pt son to call [employer] support for return kit.

## 2023-04-21 ENCOUNTER — Ambulatory Visit: Payer: Medicare HMO | Admitting: Cardiology

## 2023-05-25 ENCOUNTER — Ambulatory Visit: Payer: Medicare HMO | Admitting: Cardiology
